# Patient Record
Sex: Female | Born: 1995 | Race: White | Hispanic: No | Marital: Single | Smoking: Current every day smoker
Health system: Southern US, Community
[De-identification: ages and names within clinical notes are randomized; demographics above are authoritative.]

## PROBLEM LIST (undated history)

## (undated) DIAGNOSIS — A549 Gonococcal infection, unspecified: Secondary | ICD-10-CM

## (undated) DIAGNOSIS — F1910 Other psychoactive substance abuse, uncomplicated: Secondary | ICD-10-CM

## (undated) DIAGNOSIS — F32A Depression, unspecified: Secondary | ICD-10-CM

## (undated) DIAGNOSIS — M419 Scoliosis, unspecified: Secondary | ICD-10-CM

## (undated) DIAGNOSIS — G930 Cerebral cysts: Secondary | ICD-10-CM

## (undated) DIAGNOSIS — N390 Urinary tract infection, site not specified: Secondary | ICD-10-CM

## (undated) DIAGNOSIS — F319 Bipolar disorder, unspecified: Secondary | ICD-10-CM

## (undated) DIAGNOSIS — F329 Major depressive disorder, single episode, unspecified: Secondary | ICD-10-CM

## (undated) DIAGNOSIS — Z22322 Carrier or suspected carrier of Methicillin resistant Staphylococcus aureus: Secondary | ICD-10-CM

## (undated) HISTORY — DX: Urinary tract infection, site not specified: N39.0

## (undated) HISTORY — PX: WISDOM TOOTH EXTRACTION: SHX21

---
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
## 2014-01-10 ENCOUNTER — Emergency Department (HOSPITAL_COMMUNITY)
Admission: EM | Admit: 2014-01-10 | Discharge: 2014-01-10 | Disposition: A | Discharge status: Home / Self Care | Payer: Medicaid Other | Attending: Emergency Medicine | Admitting: Emergency Medicine

## 2014-01-10 ENCOUNTER — Encounter (HOSPITAL_COMMUNITY): Payer: Self-pay | Admitting: Emergency Medicine

## 2014-01-10 DIAGNOSIS — Z8614 Personal history of Methicillin resistant Staphylococcus aureus infection: Secondary | ICD-10-CM | POA: Diagnosis not present

## 2014-01-10 DIAGNOSIS — N390 Urinary tract infection, site not specified: Secondary | ICD-10-CM

## 2014-01-10 DIAGNOSIS — Z3202 Encounter for pregnancy test, result negative: Secondary | ICD-10-CM | POA: Diagnosis not present

## 2014-01-10 DIAGNOSIS — Z79899 Other long term (current) drug therapy: Secondary | ICD-10-CM | POA: Insufficient documentation

## 2014-01-10 DIAGNOSIS — N739 Female pelvic inflammatory disease, unspecified: Secondary | ICD-10-CM

## 2014-01-10 DIAGNOSIS — Z202 Contact with and (suspected) exposure to infections with a predominantly sexual mode of transmission: Secondary | ICD-10-CM | POA: Diagnosis present

## 2014-01-10 DIAGNOSIS — Z8739 Personal history of other diseases of the musculoskeletal system and connective tissue: Secondary | ICD-10-CM | POA: Insufficient documentation

## 2014-01-10 LAB — URINE MICROSCOPIC-ADD ON

## 2014-01-10 LAB — URINALYSIS, ROUTINE W REFLEX MICROSCOPIC
GLUCOSE, UA: NEGATIVE mg/dL
Nitrite: NEGATIVE
PH: 6 (ref 5.0–8.0)
Protein, ur: 30 mg/dL — AB
Specific Gravity, Urine: 1.032 — ABNORMAL HIGH (ref 1.005–1.030)
Urobilinogen, UA: 1 mg/dL (ref 0.0–1.0)

## 2014-01-10 LAB — WET PREP, GENITAL
CLUE CELLS WET PREP: NONE SEEN
TRICH WET PREP: NONE SEEN
YEAST WET PREP: NONE SEEN

## 2014-01-10 LAB — POC URINE PREG, ED: Preg Test, Ur: NEGATIVE

## 2014-01-10 MED ORDER — CEFTRIAXONE SODIUM 250 MG IJ SOLR
250.0000 mg | Freq: Once | INTRAMUSCULAR | Status: AC
  Administered 2014-01-10: 250 mg via INTRAMUSCULAR
  Filled 2014-01-10: qty 250

## 2014-01-10 MED ORDER — CEPHALEXIN 500 MG PO CAPS
500.0000 mg | ORAL_CAPSULE | Freq: Once | ORAL | Status: AC
  Administered 2014-01-10: 500 mg via ORAL
  Filled 2014-01-10: qty 1

## 2014-01-10 MED ORDER — LIDOCAINE HCL 1 % IJ SOLN
INTRAMUSCULAR | Status: AC
  Administered 2014-01-10: 1.6 mL
  Filled 2014-01-10: qty 20

## 2014-01-10 MED ORDER — DOXYCYCLINE HYCLATE 100 MG PO TABS: 100.0000 mg | ORAL_TABLET | Freq: Once | ORAL | Status: DC

## 2014-01-10 MED ORDER — CEPHALEXIN 500 MG PO CAPS: 500.0000 mg | ORAL_CAPSULE | Freq: Four times a day (QID) | ORAL | Status: DC

## 2014-01-10 MED ORDER — DOXYCYCLINE HYCLATE 100 MG PO TABS
100.0000 mg | ORAL_TABLET | Freq: Once | ORAL | Status: AC
  Administered 2014-01-10: 100 mg via ORAL
  Filled 2014-01-10: qty 1

## 2014-01-10 MED ORDER — AZITHROMYCIN 250 MG PO TABS
1000.0000 mg | ORAL_TABLET | Freq: Once | ORAL | Status: AC
  Administered 2014-01-10: 1000 mg via ORAL
  Filled 2014-01-10: qty 4

## 2014-01-10 NOTE — ED Notes (Addendum)
Pt reports unprotected sex, has had dysuria, vaginal discharge, and pelvic pain 8/10 x2 weeks. Last menstrual cycle at the end of June.

## 2014-01-10 NOTE — ED Provider Notes (Signed)
CSN: [id number]     Arrival date & time 01/10/14  1046 History   First MD Initiated Contact with Patient 01/10/14 1106     Chief Complaint  Patient presents with  . SEXUALLY TRANSMITTED DISEASE     (Consider location/radiation/quality/duration/timing/severity/associated sxs/prior Treatment) Patient is a 18 y.o. female presenting with female genitourinary complaint. The history is provided by the patient.  Female GU Problem This is a new problem. The problem occurs constantly. The problem has been gradually worsening. Associated symptoms include abdominal pain. Pertinent negatives include no shortness of breath. Nothing aggravates the symptoms. Nothing relieves the symptoms.    Past Medical History  Diagnosis Date  . Scoliosis   . Scoliosis   . MRSA (methicillin resistant staph aureus) culture positive    Past Surgical History  Procedure Laterality Date  . Back surgery  2012    spinal fusion  . Back surgery  2012    MRSA infection- rods removed.  . Back surgery  2013    Rods put back in   History reviewed. No pertinent family history. History  Substance Use Topics  . Smoking status: Passive Smoke Exposure - Never Smoker  . Smokeless tobacco: Not on file  . Alcohol Use: No   OB History   Grav Para Term Preterm Abortions TAB SAB Ect Mult Living                 Review of Systems  Constitutional: Negative for fever.  Respiratory: Negative for cough and shortness of breath.   Gastrointestinal: Positive for abdominal pain. Negative for vomiting.  All other systems reviewed and are negative.     Allergies  Review of patient's allergies indicates no known allergies.  Home Medications   Prior to Admission medications   Medication Sig Start Date End Date Taking? Authorizing Provider  ARIPiprazole (ABILIFY) 5 MG tablet Take 2.5 mg by mouth daily.    Yes Historical Provider, MD  benztropine (COGENTIN) 1 MG tablet Take 1 mg by mouth at bedtime.   Yes Historical Provider,  MD  diazepam (VALIUM) 5 MG tablet Take 5 mg by mouth every 6 (six) hours as needed. For muscle spasms.   Yes Historical Provider, MD  FLUoxetine (PROZAC) 10 MG tablet Take 10 mg by mouth every morning.    Yes Historical Provider, MD   BP 131/67  Pulse 101  Temp(Src) 98.5 F (36.9 C) (Oral)  Resp 16  SpO2 97% Physical Exam  Nursing note and vitals reviewed. Constitutional: She is oriented to person, place, and time. She appears well-developed and well-nourished. No distress.  HENT:  Head: Normocephalic and atraumatic.  Mouth/Throat: Oropharynx is clear and moist. No oropharyngeal exudate.  Eyes: EOM are normal. Pupils are equal, round, and reactive to light.  Neck: Normal range of motion. Neck supple.  Cardiovascular: Normal rate and regular rhythm.  Exam reveals no friction rub.   No murmur heard. Pulmonary/Chest: Effort normal and breath sounds normal. No respiratory distress. She has no wheezes. She has no rales.  Abdominal: Soft. She exhibits no distension. There is tenderness. There is no rebound.  Genitourinary: Cervix exhibits motion tenderness, discharge (yellow) and friability. Right adnexum displays tenderness. Right adnexum displays no mass and no fullness. Left adnexum displays tenderness. Left adnexum displays no mass and no fullness.  Musculoskeletal: Normal range of motion. She exhibits no edema.  Neurological: She is alert and oriented to person, place, and time.  Skin: No rash noted. She is not diaphoretic.    ED Course  Procedures (including critical care time) Labs Review Labs Reviewed  GC/CHLAMYDIA PROBE AMP  WET PREP, GENITAL  URINALYSIS, ROUTINE W REFLEX MICROSCOPIC  POC URINE PREG, ED    Imaging Review No results found.   EKG Interpretation None      MDM   Final diagnoses:  Pelvic inflammatory disease  UTI (lower urinary tract infection)    35F here with pelvic pain, white vaginal discharge, dysuria for past 2 weeks. Concerned about STDs. No  fevers, vomiting. On exam, mild diffuse lower abdominal pain. Will perform pelvic. Pelvic shows cervical tenderness, diffuse lower abdominal tenderness, no adnexal masses. Concern for PID. Treated with doxy, rocephin, azithro. UA shows UTI, also given keflex. Partner in the room advised on receiving treatment. Discharged.  Dagmar HaitWilliam Deckard Stuber, MD 01/10/14 1537

## 2014-01-10 NOTE — Discharge Instructions (Signed)
Pelvic Inflammatory Disease °Pelvic inflammatory disease (PID) refers to an infection in some or all of the female organs. The infection can be in the uterus, ovaries, fallopian tubes, or the surrounding tissues in the pelvis. PID can cause abdominal or pelvic pain that comes on suddenly (acute pelvic pain). PID is a serious infection because it can lead to lasting (chronic) pelvic pain or the inability to have children (infertile).  °CAUSES  °The infection is often caused by the normal bacteria found in the vaginal tissues. PID may also be caused by an infection that is spread during sexual contact. PID can also occur following:  °· The birth of a baby.   °· A miscarriage.   °· An abortion.   °· Major pelvic surgery.   °· The use of an intrauterine device (IUD).   °· A sexual assault.   °RISK FACTORS °Certain factors can put a person at higher risk for PID, such as: °· Being younger than 25 years. °· Being sexually active at a young age. °· Using nonbarrier contraception. °· Having multiple sexual partners. °· Having sex with someone who has symptoms of a genital infection. °· Using oral contraception. °Other times, certain behaviors can increase the possibility of getting PID, such as: °· Having sex during your period. °· Using a vaginal douche. °· Having an intrauterine device (IUD) in place. °SYMPTOMS  °· Abdominal or pelvic pain.   °· Fever.   °· Chills.   °· Abnormal vaginal discharge. °· Abnormal uterine bleeding.   °· Unusual pain shortly after finishing your period. °DIAGNOSIS  °Your caregiver will choose some of the following methods to make a diagnosis, such as:  °· Performing a physical exam and history. A pelvic exam typically reveals a very tender uterus and surrounding pelvis.   °· Ordering laboratory tests including a pregnancy test, blood tests, and urine test.  °· Ordering cultures of the vagina and cervix to check for a sexually transmitted infection (STI). °· Performing an ultrasound.    °· Performing a laparoscopic procedure to look inside the pelvis.   °TREATMENT  °· Antibiotic medicines may be prescribed and taken by mouth.   °· Sexual partners may be treated when the infection is caused by a sexually transmitted disease (STD).   °· Hospitalization may be needed to give antibiotics intravenously. °· Surgery may be needed, but this is rare. °It may take weeks until you are completely well. If you are diagnosed with PID, you should also be checked for human immunodeficiency virus (HIV).   °HOME CARE INSTRUCTIONS  °· If given, take your antibiotics as directed. Finish the medicine even if you start to feel better.   °· Only take over-the-counter or prescription medicines for pain, discomfort, or fever as directed by your caregiver.   °· Do not have sexual intercourse until treatment is completed or as directed by your caregiver. If PID is confirmed, your recent sexual partner(s) will need treatment.   °· Keep your follow-up appointments. °SEEK MEDICAL CARE IF:  °· You have increased or abnormal vaginal discharge.   °· You need prescription medicine for your pain.   °· You vomit.   °· You cannot take your medicines.   °· Your partner has an STD.   °SEEK IMMEDIATE MEDICAL CARE IF:  °· You have a fever.   °· You have increased abdominal or pelvic pain.   °· You have chills.   °· You have pain when you urinate.   °· You are not better after 72 hours following treatment.   °MAKE SURE YOU:  °· Understand these instructions. °· Will watch your condition. °· Will get help right away if you are not doing well or get worse. °  Document Released: 06/18/2005 Document Revised: 10/13/2012 Document Reviewed: 06/14/2011 [facility] Patient Information 2015 [place], [place]. This information is not intended to replace advice given to you by your health care provider. Make sure you discuss any questions you have with your health care provider.  Urinary Tract Infection Urinary tract infections (UTIs) can develop  anywhere along your urinary tract. Your urinary tract is your body's drainage system for removing wastes and extra water. Your urinary tract includes two kidneys, two ureters, a bladder, and a urethra. Your kidneys are a pair of bean-shaped organs. Each kidney is about the size of your fist. They are located below your ribs, one on each side of your spine. CAUSES Infections are caused by microbes, which are microscopic organisms, including fungi, viruses, and bacteria. These organisms are so small that they can only be seen through a microscope. Bacteria are the microbes that most commonly cause UTIs. SYMPTOMS  Symptoms of UTIs may vary by age and gender of the patient and by the location of the infection. Symptoms in young women typically include a frequent and intense urge to urinate and a painful, burning feeling in the bladder or urethra during urination. Older women and men are more likely to be tired, shaky, and weak and have muscle aches and abdominal pain. A fever may mean the infection is in your kidneys. Other symptoms of a kidney infection include pain in your back or sides below the ribs, nausea, and vomiting. DIAGNOSIS To diagnose a UTI, your caregiver will ask you about your symptoms. Your caregiver also will ask to provide a urine sample. The urine sample will be tested for bacteria and white blood cells. White blood cells are made by your body to help fight infection. TREATMENT  Typically, UTIs can be treated with medication. Because most UTIs are caused by a bacterial infection, they usually can be treated with the use of antibiotics. The choice of antibiotic and length of treatment depend on your symptoms and the type of bacteria causing your infection. HOME CARE INSTRUCTIONS  If you were prescribed antibiotics, take them exactly as your caregiver instructs you. Finish the medication even if you feel better after you have only taken some of the medication.  Drink enough water and  fluids to keep your urine clear or pale yellow.  Avoid caffeine, tea, and carbonated beverages. They tend to irritate your bladder.  Empty your bladder often. Avoid holding urine for long periods of time.  Empty your bladder before and after sexual intercourse.  After a bowel movement, women should cleanse from front to back. Use each tissue only once. SEEK MEDICAL CARE IF:   You have back pain.  You develop a fever.  Your symptoms do not begin to resolve within 3 days. SEEK IMMEDIATE MEDICAL CARE IF:   You have severe back pain or lower abdominal pain.  You develop chills.  You have nausea or vomiting.  You have continued burning or discomfort with urination. MAKE SURE YOU:   Understand these instructions.  Will watch your condition.  Will get help right away if you are not doing well or get worse. Document Released: 03/28/2005 Document Revised: 12/18/2011 Document Reviewed: 07/27/2011 [facility] Patient Information 2015 [place], [place]. This information is not intended to replace advice given to you by your health care provider. Make sure you discuss any questions you have with your health care provider.   Emergency Department Resource Guide 1) Find a Doctor and Pay Out of Pocket Although you won't have to find  out who is covered by your insurance plan, it is a good idea to ask around and get recommendations. You will then need to call the office and see if the doctor you have chosen will accept you as a new patient and what types of options they offer for patients who are self-pay. Some doctors offer discounts or will set up payment plans for their patients who do not have insurance, but you will need to ask so you aren't surprised when you get to your appointment.  2) Contact Your Local Health Department Not all health departments have doctors that can see patients for sick visits, but many do, so it is worth a call to see if yours does. If you don't know where your local  health department is, you can check in your phone book. The CDC also has a tool to help you locate your state's health department, and many state websites also have listings of all of their local health departments.  3) Find a Walk-in Clinic If your illness is not likely to be very severe or complicated, you may want to try a walk in clinic. These are popping up all over the country in pharmacies, drugstores, and shopping centers. They're usually staffed by nurse practitioners or physician assistants that have been trained to treat common illnesses and complaints. They're usually fairly quick and inexpensive. However, if you have serious medical issues or chronic medical problems, these are probably not your best option.  No Primary Care Doctor: - Call Health Connect at  [phone] - they can help you locate a primary care doctor that  accepts your insurance, provides certain services, etc. - Physician Referral Service- [phone]  Chronic Pain Problems: Organization         Address  Phone   Notes  [facility]  [phone] Patients need to be referred by their primary care doctor.   Medication Assistance: Organization         Address  Phone   Notes  [facility] Medication [facility] [address] [place] [place], [place] [address] [phone] --Must be a resident of [facility] -- Must have NO insurance coverage whatsoever (no Medicaid/ Medicare, etc.) -- The pt. MUST have a primary care doctor that directs their care regularly and follows them in the community   [facility]ist  [phone]   [employer]  [phone]    Agencies that provide inexpensive medical care: Organization         Address  Phone   Notes  [facility]  [phone]   [facility]    [phone]   [facility] [address] [place], [place] [address] [phone]   [facility] [address] [place] [place] [phone]   Planned Parenthood    [phone]   [facility]    [phone]   Community Health and [facility]  [address], [place] Phone:  [phone], Fax:  [phone] Hours of Operation:  9 am - 6 pm, M-F.  Also accepts Medicaid/Medicare and self-pay.  [facility]  [address], [place] Phone: [phone], Fax: [phone]. Hours of Operation:  8:30 am - 5:30 pm, M-F.  Also accepts Medicaid and self-pay.  [facility] [place] [address], [employer] Phone: [phone]   [facility] [address], [place],  [place] [phone], Ext. [phone] Mondays & Thursdays: 7-9 AM.  First 15 patients are seen on a first come, first serve basis.    Medicaid-accepting [facility] Providers:  Organization         Address  Phone   Notes  [facility] [address], [place] [phone]) [phone] Also accepts self-pay patients.  [facility] [address], [place]  [phone]) [phone]   [facility] [address], [place] [phone]) [phone]   [facility] [address] [place] [address], [place] [phone]) [phone]   Renaye RakersVeita Bland [address], [place]   [phone] Only accepts [place] Access [place] patients after they have their name applied to their card.   Self-Pay (no insurance) in [facility]:  Organization         Address  Phone   Notes  Sickle Cell Patients, [facility] [address], [place] [phone]   [facility] [address], [place] [phone]) [phone]   [facility] [place]  [address], [place] [phone]   [facility]/Dr. Osei-Bonsu  [address] [place] [address], [place] or [phone], [place] [phone] Phone number for both [place] and  [place] locations is the same.  [facility] and [facility] [address], [place] [phone]   [facility] [place] [address] [place] [address], [place] or [address] [phone]) [phone]   [facility] [address], [place] [phone]) [phone], phone; [phone](336) [phone], fax Sees patients 1st and 3rd Saturday of every month.  Must not qualify for public or private insurance (i.e. Medicaid, Medicare, [place] Health Choice, Veterans' Benefits)  Household income should be no more than 200% of the poverty level The clinic cannot treat you if you are pregnant or think you are pregnant  Sexually transmitted diseases are not treated at the clinic.    Dental Care: Organization         Address  Phone  Notes  [facility] of [facility] [address], [place] [phone] Accepts children up to age 19 who are enrolled in [place] or [place] Health Choice; pregnant women with a Medicaid card; and children who have applied for Medicaid or [place] Health Choice, but were declined, whose parents can pay a reduced fee at time of service.  [facility] of [facility] [place]  [address], [place] [phone] Accepts children up to age 19 who are enrolled in [place] or [place] Health Choice; pregnant women with a Medicaid card; and children who have applied for Medicaid or [place] Health Choice, but were declined, whose parents can pay a reduced fee at time of service.  [facility]  [address], [place] [phone]) [phone] Patients are seen by appointment only. Walk-ins are not accepted. [facility] will see patients 19 years of age and older. Monday - Tuesday (8am-5pm) Most Wednesdays (8:30-5pm) $30 per visit, cash only  [facility]  [address], [facility] [phone] Patients are seen by appointment only. Walk-ins are not accepted.  [facility] will see patients 19 years of age and older. One Wednesday Evening (Monthly: Volunteer Based).  $30 per visit, cash only  [employer] [facility] [facility]  (  [phone] for adults; Children under age 74, call Graduate Pediatric Dentistry at [phone]. Children aged 77-14, please call [phone] to request a pediatric application.  Dental services are provided in all areas of dental care including fillings, crowns and bridges, complete and partial dentures, implants, gum treatment, root canals, and extractions. Preventive care is also provided. Treatment is provided to both adults and children. Patients are selected via a lottery and there is often a waiting list.   [facility] [address], [place]  [phone] [email or web address]   [facility] [address] [place], [place] [phone], Ext. [phone] Second and Fourth Thursday of each month, opens at 6:30 AM; Clinic ends at 9 AM.  Patients are seen on a first-come first-served basis, and a limited number are seen during each clinic.   [facility]  [address] Hillard Danker [place], [place] [phone]   Eligibility Requirements You must have lived in [place], [place], or [place] counties for at least the last three months.   You cannot be eligible for state or federal sponsored [employer], including [employer], [place], or [employer].   You generally cannot be eligible for healthcare insurance through your employer.    How to apply: Eligibility screenings are held every Tuesday and Wednesday afternoon from 1:00 pm until 4:00 pm. You do not need an appointment for the interview!  [facility] [address], [place], [place]   [place]  [place] Department  [place]  [phone]    Behavioral Health Resources in the  Community: Intensive Outpatient Programs Organization         Address  Phone  Notes  [address]. [address], [place], [place] [phone]   [facility] Outpatient [address] [place], [place]   ADS: [facility] Drug [facility] [address], [place], [place]   [place] [address],  [place], [place] or [phone]   Substance Abuse Resources Organization         Address  Phone  Notes  Alcohol and Drug Services  [phone]   [place]  [phone]   [facility] [place]   Chinita Pester  [phone]   Residential & Outpatient Substance Abuse Program  [phone]   Psychological Services Organization         Address  Phone  Notes  [facility] [place]  [place]  [phone]   [place] [address] [place] or [phone]    Mobile Crisis Teams Organization         Address  Phone  Notes  [facility], Mobile Crisis Care Unit  [phone]   [facility]  [address] [place], [place]   [place] Levels [address], [place] [phone]    Self-Help/Support Groups Organization         Address  Phone             Notes  [place]. of [place] - variety of support groups  [place] Call for more information  Narcotics Anonymous (NA), [facility] [address], [employer] [place]  2 meetings at this location   Brewing technologist  Notes  [facility] [address]    [place]  [phone]   [facility]  [address], [place] [address], [place], [place]   Council [place], [employer] [phone] Admissions: 8am-3pm M-F  Incentives Substance [place] [address],    [place], [place] [phone]   [facility] [address] [place], [place], [place]   [facility] [address],  [place], [place]   [facility] - Intensive Outpatient [address], [place], [place]   [facility] ([place].,  [place], [place] [phone] or [phone]   [facility] (RTS) [address] [place], [place] Accepts Medicaid  [employer] [address],  [place] [phone] Substance Abuse/Addiction Treatment   [facility] Organization         Address  Phone  Notes  [facility]  [phone]   Domenic Schwab, PhD [address] Arlis Porta [place], [place]   [phone] or [phone]   [place] Deale Opal [place], [place] [phone]   [facility] [address] [place], [place] [phone] Insurance/Medicaid/sponsorship through [facility] [address] [place]                                    [place], [place] [phone] [place] [address], [place] [phone]    Dr. Adele Schilder  [phone]   [facility] Dept. 1) [address], [place] 2) [place] 3)  [place] [address], [place] [phone]  [phone]   [place] [phone] or [phone] (After Hours)

## 2014-01-11 LAB — GC/CHLAMYDIA PROBE AMP
CT Probe RNA: NEGATIVE
GC PROBE AMP APTIMA: POSITIVE — AB

## 2014-01-14 ENCOUNTER — Telehealth (HOSPITAL_BASED_OUTPATIENT_CLINIC_OR_DEPARTMENT_OTHER): Payer: Self-pay | Admitting: Emergency Medicine

## 2014-01-16 ENCOUNTER — Encounter (HOSPITAL_COMMUNITY): Payer: Self-pay | Admitting: Emergency Medicine

## 2014-01-16 ENCOUNTER — Inpatient Hospital Stay (HOSPITAL_COMMUNITY)
Admission: EM | Admit: 2014-01-16 | Discharge: 2014-01-19 | DRG: 918 | Disposition: A | Discharge status: Other Acute Inpatient Hospital | Payer: Medicaid Other | Attending: Internal Medicine | Admitting: Internal Medicine

## 2014-01-16 DIAGNOSIS — I498 Other specified cardiac arrhythmias: Secondary | ICD-10-CM | POA: Diagnosis present

## 2014-01-16 DIAGNOSIS — A5400 Gonococcal infection of lower genitourinary tract, unspecified: Secondary | ICD-10-CM | POA: Diagnosis present

## 2014-01-16 DIAGNOSIS — R45851 Suicidal ideations: Secondary | ICD-10-CM | POA: Diagnosis not present

## 2014-01-16 DIAGNOSIS — D62 Acute posthemorrhagic anemia: Secondary | ICD-10-CM | POA: Diagnosis present

## 2014-01-16 DIAGNOSIS — F319 Bipolar disorder, unspecified: Secondary | ICD-10-CM | POA: Diagnosis present

## 2014-01-16 DIAGNOSIS — T43294A Poisoning by other antidepressants, undetermined, initial encounter: Secondary | ICD-10-CM | POA: Diagnosis present

## 2014-01-16 DIAGNOSIS — D500 Iron deficiency anemia secondary to blood loss (chronic): Secondary | ICD-10-CM | POA: Diagnosis present

## 2014-01-16 DIAGNOSIS — Z981 Arthrodesis status: Secondary | ICD-10-CM

## 2014-01-16 DIAGNOSIS — T43502A Poisoning by unspecified antipsychotics and neuroleptics, intentional self-harm, initial encounter: Secondary | ICD-10-CM | POA: Diagnosis present

## 2014-01-16 DIAGNOSIS — Z9119 Patient's noncompliance with other medical treatment and regimen: Secondary | ICD-10-CM | POA: Diagnosis not present

## 2014-01-16 DIAGNOSIS — T50992A Poisoning by other drugs, medicaments and biological substances, intentional self-harm, initial encounter: Secondary | ICD-10-CM | POA: Diagnosis present

## 2014-01-16 DIAGNOSIS — T438X2A Poisoning by other psychotropic drugs, intentional self-harm, initial encounter: Secondary | ICD-10-CM | POA: Diagnosis present

## 2014-01-16 DIAGNOSIS — T361X1A Poisoning by cephalosporins and other beta-lactam antibiotics, accidental (unintentional), initial encounter: Secondary | ICD-10-CM | POA: Diagnosis present

## 2014-01-16 DIAGNOSIS — M4120 Other idiopathic scoliosis, site unspecified: Secondary | ICD-10-CM | POA: Diagnosis present

## 2014-01-16 DIAGNOSIS — T364X1A Poisoning by tetracyclines, accidental (unintentional), initial encounter: Secondary | ICD-10-CM | POA: Diagnosis present

## 2014-01-16 DIAGNOSIS — R4182 Altered mental status, unspecified: Secondary | ICD-10-CM | POA: Diagnosis not present

## 2014-01-16 DIAGNOSIS — T43224A Poisoning by selective serotonin reuptake inhibitors, undetermined, initial encounter: Secondary | ICD-10-CM | POA: Diagnosis present

## 2014-01-16 DIAGNOSIS — A549 Gonococcal infection, unspecified: Secondary | ICD-10-CM | POA: Diagnosis present

## 2014-01-16 DIAGNOSIS — T50902A Poisoning by unspecified drugs, medicaments and biological substances, intentional self-harm, initial encounter: Secondary | ICD-10-CM

## 2014-01-16 DIAGNOSIS — T426X1A Poisoning by other antiepileptic and sedative-hypnotic drugs, accidental (unintentional), initial encounter: Secondary | ICD-10-CM | POA: Diagnosis present

## 2014-01-16 DIAGNOSIS — T50901A Poisoning by unspecified drugs, medicaments and biological substances, accidental (unintentional), initial encounter: Secondary | ICD-10-CM | POA: Insufficient documentation

## 2014-01-16 DIAGNOSIS — T50902D Poisoning by unspecified drugs, medicaments and biological substances, intentional self-harm, subsequent encounter: Secondary | ICD-10-CM

## 2014-01-16 DIAGNOSIS — R9431 Abnormal electrocardiogram [ECG] [EKG]: Secondary | ICD-10-CM | POA: Diagnosis present

## 2014-01-16 DIAGNOSIS — R001 Bradycardia, unspecified: Secondary | ICD-10-CM | POA: Diagnosis present

## 2014-01-16 DIAGNOSIS — F314 Bipolar disorder, current episode depressed, severe, without psychotic features: Secondary | ICD-10-CM

## 2014-01-16 DIAGNOSIS — I959 Hypotension, unspecified: Secondary | ICD-10-CM | POA: Diagnosis present

## 2014-01-16 DIAGNOSIS — Z91199 Patient's noncompliance with other medical treatment and regimen due to unspecified reason: Secondary | ICD-10-CM

## 2014-01-16 HISTORY — DX: Bipolar disorder, unspecified: F31.9

## 2014-01-16 HISTORY — DX: Major depressive disorder, single episode, unspecified: F32.9

## 2014-01-16 HISTORY — DX: Gonococcal infection, unspecified: A54.9

## 2014-01-16 HISTORY — DX: Depression, unspecified: F32.A

## 2014-01-16 LAB — COMPREHENSIVE METABOLIC PANEL
ALT: 8 U/L (ref 0–35)
AST: 11 U/L (ref 0–37)
Albumin: 3.6 g/dL (ref 3.5–5.2)
Alkaline Phosphatase: 51 U/L (ref 39–117)
Anion gap: 11 (ref 5–15)
BUN: 10 mg/dL (ref 6–23)
CALCIUM: 9.2 mg/dL (ref 8.4–10.5)
CO2: 23 mEq/L (ref 19–32)
Chloride: 107 mEq/L (ref 96–112)
Creatinine, Ser: 0.52 mg/dL (ref 0.50–1.10)
GFR calc Af Amer: >90 mL/min (ref >90)
GFR calc non Af Amer: >90 mL/min (ref >90)
Glucose, Bld: 92 mg/dL (ref 70–99)
Potassium: 3.9 mEq/L (ref 3.7–5.3)
SODIUM: 141 meq/L (ref 137–147)
TOTAL PROTEIN: 7.1 g/dL (ref 6.0–8.3)
Total Bilirubin: 0.3 mg/dL (ref 0.3–1.2)

## 2014-01-16 LAB — ACETAMINOPHEN LEVEL: Acetaminophen (Tylenol), Serum: <15 ug/mL (ref 10–30)

## 2014-01-16 LAB — CBC WITH DIFFERENTIAL/PLATELET
BASOS ABS: 0 10*3/uL (ref 0.0–0.1)
Basophils Relative: 0 % (ref 0–1)
EOS ABS: 0.1 10*3/uL (ref 0.0–0.7)
Eosinophils Relative: 1 % (ref 0–5)
HCT: 36.5 % (ref 36.0–46.0)
Hemoglobin: 11.7 g/dL — ABNORMAL LOW (ref 12.0–15.0)
LYMPHS PCT: 18 % (ref 12–46)
Lymphs Abs: 1.2 10*3/uL (ref 0.7–4.0)
MCH: 29 pg (ref 26.0–34.0)
MCHC: 32.1 g/dL (ref 30.0–36.0)
MCV: 90.6 fL (ref 78.0–100.0)
Monocytes Absolute: 0.4 10*3/uL (ref 0.1–1.0)
Monocytes Relative: 6 % (ref 3–12)
NEUTROS PCT: 75 % (ref 43–77)
Neutro Abs: 5 10*3/uL (ref 1.7–7.7)
PLATELETS: 208 10*3/uL (ref 150–400)
RBC: 4.03 MIL/uL (ref 3.87–5.11)
RDW: 13.4 % (ref 11.5–15.5)
WBC: 6.7 10*3/uL (ref 4.0–10.5)

## 2014-01-16 LAB — URINALYSIS, ROUTINE W REFLEX MICROSCOPIC
Bilirubin Urine: NEGATIVE
GLUCOSE, UA: NEGATIVE mg/dL
Hgb urine dipstick: NEGATIVE
Ketones, ur: NEGATIVE mg/dL
Nitrite: NEGATIVE
PH: 6 (ref 5.0–8.0)
Protein, ur: 30 mg/dL — AB
Specific Gravity, Urine: 1.043 — ABNORMAL HIGH (ref 1.005–1.030)
Urobilinogen, UA: 0.2 mg/dL (ref 0.0–1.0)

## 2014-01-16 LAB — RAPID URINE DRUG SCREEN, HOSP PERFORMED
Amphetamines: NOT DETECTED
Barbiturates: NOT DETECTED
Benzodiazepines: NOT DETECTED
COCAINE: NOT DETECTED
OPIATES: NOT DETECTED
Tetrahydrocannabinol: NOT DETECTED

## 2014-01-16 LAB — SALICYLATE LEVEL: Salicylate Lvl: <2 mg/dL — ABNORMAL LOW (ref 2.8–20.0)

## 2014-01-16 LAB — MRSA PCR SCREENING: MRSA by PCR: NEGATIVE

## 2014-01-16 LAB — HIV ANTIBODY (ROUTINE TESTING W REFLEX): HIV 1&2 Ab, 4th Generation: NONREACTIVE

## 2014-01-16 LAB — URINE MICROSCOPIC-ADD ON

## 2014-01-16 LAB — MAGNESIUM: Magnesium: 1.8 mg/dL (ref 1.5–2.5)

## 2014-01-16 LAB — PREGNANCY, URINE: Preg Test, Ur: NEGATIVE

## 2014-01-16 MED ORDER — SODIUM CHLORIDE 0.9 % IJ SOLN
3.0000 mL | Freq: Two times a day (BID) | INTRAMUSCULAR | Status: DC
  Administered 2014-01-16 – 2014-01-19 (×2): 3 mL via INTRAVENOUS

## 2014-01-16 MED ORDER — SODIUM CHLORIDE 0.9 % IV SOLN
Freq: Once | INTRAVENOUS | Status: AC
  Administered 2014-01-17: via INTRAVENOUS

## 2014-01-16 MED ORDER — ONDANSETRON HCL 4 MG/2ML IJ SOLN: 4.0000 mg | Freq: Once | INTRAMUSCULAR | Status: DC

## 2014-01-16 MED ORDER — SODIUM CHLORIDE 0.9 % IV BOLUS (SEPSIS): 1000.0000 mL | Freq: Once | INTRAVENOUS | Status: DC

## 2014-01-16 MED ORDER — ONDANSETRON HCL 4 MG/2ML IJ SOLN: 4.0000 mg | Freq: Four times a day (QID) | INTRAMUSCULAR | Status: DC | PRN

## 2014-01-16 MED ORDER — ENOXAPARIN SODIUM 40 MG/0.4ML ~~LOC~~ SOLN
40.0000 mg | SUBCUTANEOUS | Status: DC
  Administered 2014-01-16 – 2014-01-18 (×3): 40 mg via SUBCUTANEOUS
  Filled 2014-01-16 (×4): qty 0.4

## 2014-01-16 MED ORDER — ALUM & MAG HYDROXIDE-SIMETH 200-200-20 MG/5ML PO SUSP
30.0000 mL | Freq: Four times a day (QID) | ORAL | Status: DC | PRN
Start: 2014-01-16 — End: 2014-01-19

## 2014-01-16 MED ORDER — SODIUM CHLORIDE 0.9 % IV SOLN: Freq: Once | INTRAVENOUS | Status: DC

## 2014-01-16 MED ORDER — ACETAMINOPHEN 650 MG RE SUPP: 650.0000 mg | Freq: Four times a day (QID) | RECTAL | Status: DC | PRN

## 2014-01-16 MED ORDER — ACETAMINOPHEN 325 MG PO TABS: 650.0000 mg | ORAL_TABLET | Freq: Four times a day (QID) | ORAL | Status: DC | PRN

## 2014-01-16 MED ORDER — ONDANSETRON HCL 4 MG PO TABS: 4.0000 mg | ORAL_TABLET | Freq: Four times a day (QID) | ORAL | Status: DC | PRN

## 2014-01-16 NOTE — H&P (Signed)
History and Physical:    Karen DrownMarissa J Kocher ZOX:[id number] DOB: 10/05/1995 DOA: 01/16/2014  Referring provider: Janifer Adieatyana Kiriche, PA-C. PCP: No primary provider on file.   Chief Complaint: Overdose  History of Present Illness:   Karen DrownMarissa J Kim is an 18 y.o. female with PMH of depression and recent diagnosis of bipolar disorder, [facility], treated with Prozac, Lexapro, and Abilify by Tamela OddiJo Hughes at [facility], but who has been noncompliant with her medications, brought to the hospital after overdosing on approximately 20 Prozac tablets, 20 Lexapro tablets, unspecified amount of doxycycline, Neurontin and Keflex in a to harm herself after she broke up with her boyfriend last night. When asked specifically if she was trying to harm herself, the patient denies to me and tells me she was "just trying to get high ". There is no known history of substance abuse. According to her mother, she has been refusing to take her psychiatric medications and refusing to followup with her treating physicians. She's been meeting men online via [employer] and has been sexually promiscuous, with recent diagnosis of gonorrhea (would not take the antibiotics prescribed to treat this because it was causing her GI issues and diarrhea). The patient's mother became alerted that she had overdosed when a friend called her saying that her daughter "took a bunch of pills ". When her mother confronted her and called EMS, the patient said "why are you doing this to me? Why can't you just let me die? "Patient is currently alert and responsive. She has some nausea but otherwise is without significant complaints.  ROS:   Constitutional: No fever, no chills;  Appetite normal; No weight loss, no weight gain, + fatigue.  HEENT: No blurry vision, no diplopia, no pharyngitis, no dysphagia CV: No chest pain, no palpitations, no PND, no orthopnea, no edema.  Resp: + mild SOB, no cough, no pleuritic  pain. GI: + nausea, + recent vomiting, + recent diarrhea, no melena, no hematochezia, no constipation, no abdominal pain.  GU: No dysuria, no hematuria, no frequency, no urgency. MSK: no myalgias, no arthralgias.  Neuro:  + headache, no focal neurological deficits, no history of seizures.  Psych: + depression, +anxiety.  Endo: No heat intolerance, no cold intolerance, no polyuria, no polydipsia  Skin: No rashes, no skin lesions.  Heme: No easy bruising.  Travel history: No recent travel.   Past Medical History:   Past Medical History  Diagnosis Date  . Scoliosis   . MRSA (methicillin resistant staph aureus) culture positive   . Bipolar affective   . Depression   . Gonorrhea     Past Surgical History:   Past Surgical History  Procedure Laterality Date  . Back surgery  2012    spinal fusion  . Back surgery  2012    MRSA infection- rods removed.  . Back surgery  2013    Rods put back in    Social History:   History   Social History  . Marital Status: Single    Spouse Name: N/A    Number of Children: N/A  . Years of Education: 12   Occupational History  . Unemployed    Social History Main Topics  . Smoking status: Passive Smoke Exposure - Never Smoker  . Smokeless tobacco: Not on file  . Alcohol Use: No  . Drug Use: No  . Sexual Activity: No   Other Topics Concern  . Not on file   Social History Narrative  Lives with mother. Single. Graduated high school. Currently unemployed.    Family history:   Family History  Problem Relation Age of Onset  . Suicidality Father     Committed suicide    Allergies   Review of patient's allergies indicates no known allergies.  Current Medications:   Prior to Admission medications   Medication Sig Start Date End Date Taking? Authorizing Provider  ARIPiprazole (ABILIFY) 5 MG tablet Take 2.5 mg by mouth daily.    Yes Historical Provider, MD  cephALEXin (KEFLEX) 500 MG capsule Take 1 capsule (500 mg total) by mouth 4  (four) times daily. 01/10/14  Yes Dagmar Hait, MD  doxycycline (VIBRA-TABS) 100 MG tablet Take 100 mg by mouth 2 (two) times daily. 01/10/14  Yes Dagmar Hait, MD  escitalopram (LEXAPRO) 5 MG tablet Take 5 mg by mouth daily.   Yes Historical Provider, MD  FLUoxetine (PROZAC) 10 MG tablet Take 10 mg by mouth every morning.     Historical Provider, MD    Physical Exam:   Filed Vitals:   01/16/14 1312  BP: 109/61  Pulse: 74  Temp: 97.7 F (36.5 C)  TempSrc: Oral  Resp: 22  SpO2: 100%     Physical Exam: Blood pressure 109/61, pulse 74, temperature 97.7 F (36.5 C), temperature source Oral, resp. rate 22, SpO2 100.00%. Gen: No acute distress.  Sedated. Head: Normocephalic, atraumatic. Eyes: PERRL with dilated pupils, EOMI, sclerae nonicteric. Mouth: Oropharynx clear with dry mucous membranes. Neck: Supple, no thyromegaly, no lymphadenopathy, no jugular venous distention. Chest: Lungs clear to auscultation bilaterally. CV: Heart sounds are regular, no murmurs, rubs, or gallops. Abdomen: Soft, nontender, nondistended with normal active bowel sounds. Extremities: Extremities are without clubbing, edema, or cyanosis. Skin: Warm and dry. Neuro: Slightly sedated but oriented times 3; cranial nerves II through XII grossly intact. Psych: Mood and affect depressed/flat.   Data Review:    Labs: Basic Metabolic Panel:  Recent Labs Lab 01/16/14 1330  NA 141  K 3.9  CL 107  CO2 23  GLUCOSE 92  BUN 10  CREATININE 0.52  CALCIUM 9.2  MG 1.8   Liver Function Tests:  Recent Labs Lab 01/16/14 1330  AST 11  ALT 8  ALKPHOS 51  BILITOT 0.3  PROT 7.1  ALBUMIN 3.6   CBC:  Recent Labs Lab 01/16/14 1330  WBC 6.7  NEUTROABS 5.0  HGB 11.7*  HCT 36.5  MCV 90.6  PLT 208    Radiographic Studies: No results found.  EKG: Independently reviewed. Sinus rhythm at 75 beats per minute with no ST or T wave abnormalities appreciated.   Assessment/Plan:    Principal Problem:   Drug overdose  Poison control contacted with recommendations to monitor on telemetry, monitor electrolytes including potassium and magnesium, monitor for QTC prolongation, check 4 hour Tylenol levels, and administer IV fluids. Can give benzodiazepines if needed for agitation. Repeat EKG in the morning.  Potassium and magnesium currently within normal limits.  Recruitment consultant.  Psychiatric consultation. Hold psychotropic medications for now.  Active Problems:   Bipolar affective / depression  Psychiatric consultation.    Gonorrhea  Was treated but did not complete her antibiotic course. Recheck GC/Chlamydia and add RPR and HIV testing given that she is having unprotected sex.  DVT prophylaxis  Lovenox ordered.  Code Status: Full. Family Communication: Mother at bedside. Disposition Plan: Home versus inpatient psych facility when stable.  Time spent: 1 hour.  RAMA,CHRISTINA [facility] Pager [phone] Cell: [phone]  If 7PM-7AM, please contact night-coverage [email or web address] Password [email or web address] 01/16/2014, 3:30 PM    **Disclaimer: This note was dictated with voice recognition software. Similar sounding words can inadvertently be transcribed and this note may contain transcription errors which may not have been corrected upon publication of note.**

## 2014-01-16 NOTE — ED Notes (Signed)
Thyra BreedJeana, RN phoned prior to her arrival to tell us that pt. Took unknown quantities of the following:  Abilify;  Prozac;  Lexapro;  Doxycycline;  Neurontin; & Keflex.  With this combination they expect: possible ventricular dysrhythmias; >Qtc interval; Abd. Discomfort; CNS depression; tachycardia; and if sufficient quantity, Abilify can cause bradycardia.  They recommend Cardiac monitor; Replace K+ and Mg++ as needed for >Qtc; check serum electrolytes; 4-hour Tylenol level; IV fluids; Benzodiazepines as needed; Watch 24 hours and repeat EKG at that time. Poison control phone #[phone].

## 2014-01-16 NOTE — ED Notes (Signed)
Pt is refusing to Ambulate to restroom. I explained we would have to perform and I/O to obtain sample. Pt went back to resting without commenting.

## 2014-01-16 NOTE — ED Notes (Signed)
Pt's mom at bedside reports that she received facebook message from pt's friend stating pt overdosed with intent to kill herself. Mom states pt has hx of suicide attempt in past when she had to be intubated. Mom reports pt has been extremely promiscuous, running away from home and mom is very worried that "one day I will come home and find her dead, she is that determined to die". Mom sts [facility] past, but she feels they are not able to help her there and she needs to have inpatient treatment somewhere else.

## 2014-01-16 NOTE — ED Provider Notes (Signed)
CSN: [id number]     Arrival date & time 01/16/14  1305 History   First MD Initiated Contact with Patient 01/16/14 1310     No chief complaint on file.    (Consider location/radiation/quality/duration/timing/severity/associated sxs/prior Treatment) HPI Karen Kim is a 18 y.o. female who presents emergency department after an overdose. Patient with history of depression and anxiety, states her boyfriend broke up with her yesterday, she was upset today, and took "a bunch of different pills." She reports taking all of her medications and her father's medications.. Per EMS patient took all the remaining pills from bottles with Abilify, Prozac, Lexapro, doxycycline, Neurontin, Keflex. Patient states she believes there were approximately 20 pills in each bottle. She took all of these medications at 12 PM. She at first told us that she took the medications to get high, however then admitted that she was upset. Patient states she currently feels sedated, states has had some nausea and episode of vomiting. Denies vomiting any pills. Denies disorientation, chest pain, shortness of breath, abdominal pain.   Past Medical History  Diagnosis Date  . Scoliosis   . Scoliosis   . MRSA (methicillin resistant staph aureus) culture positive    Past Surgical History  Procedure Laterality Date  . Back surgery  2012    spinal fusion  . Back surgery  2012    MRSA infection- rods removed.  . Back surgery  2013    Rods put back in   No family history on file. History  Substance Use Topics  . Smoking status: Passive Smoke Exposure - Never Smoker  . Smokeless tobacco: Not on file  . Alcohol Use: No   OB History   Grav Para Term Preterm Abortions TAB SAB Ect Mult Living                 Review of Systems  Constitutional: Positive for fatigue. Negative for fever and chills.  Respiratory: Negative for cough, chest tightness and shortness of breath.   Cardiovascular: Negative for chest pain,  palpitations and leg swelling.  Gastrointestinal: Negative for nausea, vomiting, abdominal pain and diarrhea.  Genitourinary: Negative for dysuria and flank pain.  Musculoskeletal: Negative for arthralgias, myalgias, neck pain and neck stiffness.  Skin: Negative for rash.  Neurological: Positive for weakness and light-headedness. Negative for dizziness and headaches.  Psychiatric/Behavioral: Positive for self-injury. The patient is nervous/anxious.   All other systems reviewed and are negative.     Allergies  Review of patient's allergies indicates no known allergies.  Home Medications   Prior to Admission medications   Medication Sig Start Date End Date Taking? Authorizing Provider  ARIPiprazole (ABILIFY) 5 MG tablet Take 2.5 mg by mouth daily.     Historical Provider, MD  benztropine (COGENTIN) 1 MG tablet Take 1 mg by mouth at bedtime.    Historical Provider, MD  cephALEXin (KEFLEX) 500 MG capsule Take 1 capsule (500 mg total) by mouth 4 (four) times daily. 01/10/14   Dagmar HaitWilliam Blair Walden, MD  diazepam (VALIUM) 5 MG tablet Take 5 mg by mouth every 6 (six) hours as needed. For muscle spasms.    Historical Provider, MD  doxycycline (VIBRA-TABS) 100 MG tablet Take 1 tablet (100 mg total) by mouth once. 01/10/14   Dagmar HaitWilliam Blair Walden, MD  FLUoxetine (PROZAC) 10 MG tablet Take 10 mg by mouth every morning.     Historical Provider, MD   BP 109/61  Pulse 74  Temp(Src) 97.7 F (36.5 C) (Oral)  Resp 22  SpO2 100% Physical Exam  Nursing note and vitals reviewed. Constitutional: She is oriented to person, place, and time. She appears well-developed and well-nourished. No distress.  HENT:  Head: Normocephalic.  Eyes: Conjunctivae are normal.  Pupils dilated  Neck: Neck supple.  Cardiovascular: Normal rate, regular rhythm and normal heart sounds.   Pulmonary/Chest: Effort normal and breath sounds normal. No respiratory distress. She has no wheezes. She has no rales.  Abdominal: Soft.  Bowel sounds are normal. She exhibits no distension. There is no tenderness. There is no rebound.  Musculoskeletal: She exhibits no edema.  Neurological: She is alert and oriented to person, place, and time.  Skin: Skin is warm and dry.  Psychiatric: Her speech is normal and behavior is normal. Cognition and memory are normal. She exhibits a depressed mood. She expresses suicidal ideation. She expresses suicidal plans.  Pt is tearful    ED Course  Procedures (including critical care time) Labs Review Labs Reviewed  CBC WITH DIFFERENTIAL - Abnormal; Notable for the following:    Hemoglobin 11.7 (*)    All other components within normal limits  SALICYLATE LEVEL - Abnormal; Notable for the following:    Salicylate Lvl <2.0 (*)    All other components within normal limits  COMPREHENSIVE METABOLIC PANEL  MAGNESIUM  ACETAMINOPHEN LEVEL  URINE RAPID DRUG SCREEN (HOSP PERFORMED)  URINALYSIS, ROUTINE W REFLEX MICROSCOPIC  POC URINE PREG, ED    Imaging Review No results found.   EKG Interpretation None      MDM   Final diagnoses:  Overdose, intentional self-harm, initial encounter    Discussed with Poison Control Plan: Monitor, ECG, watch for prolonged QTC, Electrolyte replacement as needed, Q4 hr tylenol level. IV fluids. Benzos as needed. Watch for 24 hrs. Repeat ECG.   2:11 PM Pt continues to be awake, slightly drowsy. Protecting airway. No Distress. No complaints other than nausea, zofran ordered. IV fluids running.   Labs all normal. ECG normal. Pt solmnolent but easily arousable to voice. Will admit.   [facility] with [facility], will [facility] pt.   Filed Vitals:   01/16/14 1312  BP: 109/61  Pulse: 74  Temp: 97.7 F (36.5 C)  TempSrc: Oral  Resp: 22  SpO2: 100%       Lottie Mussel, PA-C 01/16/14 1546

## 2014-01-16 NOTE — ED Notes (Signed)
Notified pt we needed urine sample. Pt stated she didn't have to go at this time. Pt said she would notify when she was able to void.

## 2014-01-16 NOTE — ED Notes (Signed)
Bed: RESB Expected date:  Expected time:  Means of arrival:  Comments: EMS-OD 

## 2014-01-17 DIAGNOSIS — R45851 Suicidal ideations: Secondary | ICD-10-CM

## 2014-01-17 DIAGNOSIS — F3160 Bipolar disorder, current episode mixed, unspecified: Secondary | ICD-10-CM

## 2014-01-17 DIAGNOSIS — I959 Hypotension, unspecified: Secondary | ICD-10-CM | POA: Diagnosis present

## 2014-01-17 DIAGNOSIS — Z5189 Encounter for other specified aftercare: Secondary | ICD-10-CM

## 2014-01-17 DIAGNOSIS — R001 Bradycardia, unspecified: Secondary | ICD-10-CM | POA: Diagnosis present

## 2014-01-17 DIAGNOSIS — R9431 Abnormal electrocardiogram [ECG] [EKG]: Secondary | ICD-10-CM | POA: Diagnosis present

## 2014-01-17 LAB — COMPREHENSIVE METABOLIC PANEL
ALT: 6 U/L (ref 0–35)
ANION GAP: 6 (ref 5–15)
AST: 9 U/L (ref 0–37)
Albumin: 2.7 g/dL — ABNORMAL LOW (ref 3.5–5.2)
Alkaline Phosphatase: 41 U/L (ref 39–117)
BUN: 11 mg/dL (ref 6–23)
CO2: 25 mEq/L (ref 19–32)
Calcium: 8.4 mg/dL (ref 8.4–10.5)
Chloride: 109 mEq/L (ref 96–112)
Creatinine, Ser: 0.52 mg/dL (ref 0.50–1.10)
GFR calc non Af Amer: >90 mL/min (ref >90)
Glucose, Bld: 95 mg/dL (ref 70–99)
Potassium: 4.1 mEq/L (ref 3.7–5.3)
SODIUM: 140 meq/L (ref 137–147)
TOTAL PROTEIN: 5.6 g/dL — AB (ref 6.0–8.3)
Total Bilirubin: 0.2 mg/dL — ABNORMAL LOW (ref 0.3–1.2)

## 2014-01-17 LAB — MAGNESIUM: MAGNESIUM: 1.6 mg/dL (ref 1.5–2.5)

## 2014-01-17 LAB — ACETAMINOPHEN LEVEL
Acetaminophen (Tylenol), Serum: <15 ug/mL (ref 10–30)
Acetaminophen (Tylenol), Serum: <15 ug/mL (ref 10–30)

## 2014-01-17 LAB — RPR

## 2014-01-17 LAB — TROPONIN I

## 2014-01-17 MED ORDER — SODIUM CHLORIDE 0.9 % IV SOLN
INTRAVENOUS | Status: DC
  Administered 2014-01-17: 12:00:00 via INTRAVENOUS

## 2014-01-17 NOTE — Progress Notes (Signed)
Pt alert x4 sitter and mother at bedside. Bp 's were in the 80's/50's, order given for one liter bolus, the Pt responded well to the fluids.Will continue to monitor. Estill DoomsSimon, Karima Carrell Mahario, RN 6:54 AM 6:55 AM

## 2014-01-17 NOTE — Progress Notes (Signed)
Pt's Safety sitter reported to mw that Pt is pulling on her IV line, asking to go home. When I walked in to Pt's room , pt was found pulling on her IV line visibly upset that she is not able to have her phone and telling me she wants to go home. Pt made to understand she is not medically cleared to go home and that she is involuntary committed to stay in the hospital, pt became more upset and asked to speak with a police officer, security department was notified, two security personal and a police officer came on the unit and spoke with Pt. Pt was relatively upset but not verbally or physically combative with staff.

## 2014-01-17 NOTE — Progress Notes (Signed)
CSW faxed IVC paperwork to Magistrate. Per Cain SieveAdamson, paperwork accepted and sheriff to be dispatched. IVC paperwork placed on chart and RN aware.  Mariann LasterAlexandra Jacqualynn Parco LCSWA,     ED CSW  phone: [phone] 3:10pm

## 2014-01-17 NOTE — Progress Notes (Signed)
Pt alert x4  bp is soft 74/33, no c/o pain. Can answer yes and no question.Made Karen McgregorMary Lynch MP aware, orders given will monitor. Estill DoomsSimon, Manju Kulkarni Mahario, RN 01/17/2014 4:22 AM

## 2014-01-17 NOTE — Progress Notes (Addendum)
Clinical Social Work Department CLINICAL SOCIAL WORK PSYCHIATRY SERVICE LINE ASSESSMENT 01/17/2014  Patient:  Karen Kim  Account:  [id number]  Admit Date:  01/16/2014  Clinical Social Worker:  Mariann Laster  Date/Time:  01/17/2014 03:55 PM Referred by:  Physician  Date referred:  01/17/2014 Reason for Referral  Psychosocial assessment   Presenting Symptoms/Problems (In the person's/family's own words):   "I was brought here beacuse I overdosed on painkillers"   Abuse/Neglect/Trauma History (check all that apply)  Denies history   Abuse/Neglect/Trauma Comments:   Psychiatric History (check all that apply)  Inpatient/hospitilization  Outpatient treatment   Psychiatric medications:  Current Mental Health Hospitalizations/Previous Mental Health History:   Pt has diagnosis of bipolar disorder. Pt states she has panic disorder.   Current provider:   Dr. Starling Manns   Place and Date:   [facility]   Current Medications:   Scheduled Meds:      . enoxaparin (LOVENOX) injection  40 mg Subcutaneous Q24H  . sodium chloride  3 mL Intravenous Q12H        Continuous Infusions:      . sodium chloride 50 mL/hr at 01/17/14 1218          PRN Meds:.acetaminophen, acetaminophen, alum & mag hydroxide-simeth, ondansetron (ZOFRAN) IV, ondansetron       Previous Impatient Admission/Date/Reason:   [facility]/2012/Overdose  [facility]/2012/self-injurious behavior   Emotional Health / Current Symptoms    Suicide/Self Harm  Suicide attempt in past (date/description)  Suicidal ideation (ex: "I can't take any more,I wish I could disappear")   Suicide attempt in the past:   2012--Overdose on a combination of her own pain medication and other medication she found in her parent's house. This led to her hospitalization at [facility].   Other harmful behavior:   Pt states that she has passive suicidal ideation currently because "something really bad has  happened recently, but I don't have an acutal plan to kill myself. I just think about dying sometimes." declines to discuss "really bad" event further at this time.   Psychotic/Dissociative Symptoms  Other - See comment   Other Psychotic/Dissociative Symptoms:   Pt states    Attention/Behavioral Symptoms  Within Normal Limits   Other Attention / Behavioral Symptoms:    Cognitive Impairment  Within Normal Limits   Other Cognitive Impairment:    Mood and Adjustment  Aggressive/frustrated  Guarded    Stress, Anxiety, Trauma, Any Recent Loss/Stressor  Other - See comment   Anxiety (frequency):   Pt reports she has panic attacks.   Phobia (specify):   Compulsive behavior (specify):   Obsessive behavior (specify):   Other:   Pt reports she has suffered a loss recently, but pt declines to discuss further at this time.   Substance Abuse/Use  Current substance use   SBIRT completed (please refer for detailed history):  Y  Self-reported substance use:   Alcohol--last use: one month  ago freq: only on holidays and special occasions "but when I drink, I binge drink, like, 10 or more beers" Marijuana--last use:  2 days ago freq: "I just started smoking, maybe a week ago, this was the third time I've tried it."  shared a bowl with friends (not on drug screen)  Opiates--last use: overdose (not on drug screen)   Urinary Drug Screen Completed:  Y Alcohol level:   none detected    Environmental/Housing/Living Arrangement  With Biological Parent(s)   Who is in the home:   Pt  lives with her mother, step father,a  younger brother (87) and an older brother (58)   Emergency contact:  mother--Angie Krus--[phone]   Financial  Medicaid   Patient's Strengths and Goals (patient's own words):   Pt states that she is good at drawing. Pt states she would like to move out on her own and get a car.   Clinical Social Worker's Interpretive Summary:   CSW received a referral  from Psych MD to complete psychosocial assessment.    CSW met with pt at bedside. Pt states that she came to the hospital because she overdosed on painkillers to get high. The drug screen reads that she overdosed on antidepressants and antibiotics. Pt states she was not trying to kill herself--only trying to get high. Pt currently endorses passive SI, stating she that "something really bad has happened recently, but I don't have an acutal plan to kill myself. I just think about dying sometimes."    Pt states that she attempted suicide once before in 2012, when she overdosed on painkillers. She was hospitalized at [facility] after this attempt. She was also hospitalized at [facility] in 2011 after she cut her arm deeply with a shard of glass.    Pt is 1 and lives in [place] with her parents and two of her brothers, and she graduated from high school this June 2015. Pt's current psychiatrist is Jobie Quaker, MD at [facility]. Pt has a  diagnosis of bipolar disorder. Pt reports that she has stopped taking all of her psychiatric medication because she does not think it helps her. Pt could not provide a timeline on when she stopped taking the medication, nor a list of her medication.    CSW to confer with psychiatrist and assist with disposition.   Disposition:  Recommend Psych CSW continuing to support while in hospital
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
## 2014-01-17 NOTE — Progress Notes (Addendum)
Progress Note   ALYAH BOEHNING ZOX:[id number] DOB: 1995/11/25 DOA: 01/16/2014 PCP: No primary provider on file.   Brief Narrative:   Karen Kim is an 18 y.o. female with PMH of depression and recent diagnosis of bipolar disorder, multiple [facility] admissions for suicidal ideation, treated with Prozac, Lexapro, and Abilify by Tamela Oddi at [facility], but who has been noncompliant with her medications, brought to the hospital 01/16/14 after overdosing on approximately 20 Prozac tablets, 20 Lexapro tablets, unspecified amount of doxycycline, Neurontin and Keflex in a to harm herself after she broke up with her boyfriend. Psychiatric consultation is pending.  Assessment/Plan:   Principal Problem:  Drug overdose  Poison control contacted with recommendations to monitor on telemetry, monitor electrolytes including potassium and magnesium, monitor for QTC prolongation, check 4 hour Tylenol levels (not elevated), and administer IV fluids. Can give benzodiazepines if needed for agitation. Repeat EKG from this morning shows T-wave inversions in the anterior leads and a QTC of 405 ms.  UDS Potassium and magnesium remain within normal limits.  Continue Recruitment consultant.  Psychiatric consultation pending. Hold psychotropic medications for now.  Active Problems:  Hypotension / bradycardia / T-wave inversions  Responded to IV fluid bolus. Monitor on telemetry an additional 24 hours.  Check 1 set of troponins given persistent T-wave inversions in the anterior leads.  Bipolar affective / depression  Psychiatric consultation.  Gonorrhea  Was treated but did not complete her antibiotic course. Repeat GC/Chlamydia pending. HIV and RPR nonreactive. UPT negative.  DVT prophylaxis  Lovenox ordered.  Code Status: Full.  Family Communication: Mother at bedside.  Disposition Plan: Home versus inpatient psych facility when stable.    IV Access:    Peripheral IV   Procedures and  diagnostic studies:    None.   Medical Consultants:    Psychiatry.   Other Consultants:    None.   Anti-Infectives:    None.  Subjective:   Karen Kim without complaints this morning. No shortness of breath, nausea, vomiting, diarrhea or other complaints. Nursing staff report that she was bradycardic with soft blood pressures overnight.  Objective:    Filed Vitals:   01/17/14 0400 01/17/14 0500 01/17/14 0600 01/17/14 0645  BP: 92/49 87/42 88/34  107/57  Pulse: 61 67 56   Temp:      TempSrc:      Resp: 13 13 18 15   Height:      Weight:      SpO2: 100% 99% 97%     Intake/Output Summary (Last 24 hours) at 01/17/14 0716 Last data filed at 01/17/14 0641  Gross per 24 hour  Intake    625 ml  Output    400 ml  Net    225 ml    Exam: Gen:  NAD Psych: Flat affect Cardiovascular:  Mildly bradycardic, No M/R/G Respiratory:  Lungs CTAB Gastrointestinal:  Abdomen soft, NT/ND, + BS Extremities:  No C/E/C   Data Reviewed:    Labs: Basic Metabolic Panel:  Recent Labs Lab 01/16/14 1330 01/17/14 0526  NA 141 140  K 3.9 4.1  CL 107 109  CO2 23 25  GLUCOSE 92 95  BUN 10 11  CREATININE 0.52 0.52  CALCIUM 9.2 8.4  MG 1.8 1.6   GFR Estimated Creatinine Clearance: 117.2 ml/min (by C-G formula based on Cr of 0.52). Liver Function Tests:  Recent Labs Lab 01/16/14 1330 01/17/14 0526  AST 11 9  ALT 8 6  ALKPHOS 51 41  BILITOT  0.3 0.2*  PROT 7.1 5.6*  ALBUMIN 3.6 2.7*   CBC:  Recent Labs Lab 01/16/14 1330  WBC 6.7  NEUTROABS 5.0  HGB 11.7*  HCT 36.5  MCV 90.6  PLT 208   Microbiology Recent Results (from the past 240 hour(s))  GC/CHLAMYDIA PROBE AMP     Status: Abnormal   Collection Time    01/10/14 12:55 PM      Result Value Ref Range Status   CT Probe RNA NEGATIVE  NEGATIVE Final   GC Probe RNA POSITIVE (*) NEGATIVE Final   Comment: (NOTE)     A Positive CT or NG Nucleic Acid Amplification Test (NAAT) result     should be  considered presumptive evidence of infection.  The result     should be evaluated along with physical examination and other     diagnostic findings.                                                                                               **Normal Reference Range: Negative**          Assay performed using the Gen-Probe APTIMA COMBO2 (R) Assay.     Acceptable specimen types for this assay include APTIMA Swabs (Unisex,     endocervical, urethral, or vaginal), first void urine, and ThinPrep     liquid based cytology samples.     Performed at [facility]  WET PREP, GENITAL     Status: Abnormal   Collection Time    01/10/14  1:41 PM      Result Value Ref Range Status   Yeast Wet Prep HPF POC NONE SEEN  NONE SEEN Final   Trich, Wet Prep NONE SEEN  NONE SEEN Final   Clue Cells Wet Prep HPF POC NONE SEEN  NONE SEEN Final   WBC, Wet Prep HPF POC FEW (*) NONE SEEN Final  MRSA PCR SCREENING     Status: None   Collection Time    01/16/14  4:49 PM      Result Value Ref Range Status   MRSA by PCR NEGATIVE  NEGATIVE Final   Comment:            The GeneXpert MRSA Assay (FDA     approved for NASAL specimens     only), is one component of a     comprehensive MRSA colonization     surveillance program. It is not     intended to diagnose MRSA     infection nor to guide or     monitor treatment for     MRSA infections.     Medications:   . enoxaparin (LOVENOX) injection  40 mg Subcutaneous Q24H  . sodium chloride  3 mL Intravenous Q12H   Continuous Infusions:   Time spent: 25 minutes.   LOS: 1 day   Karen Kim  [facility] Pager [phone]. If unable to reach me by pager, please call my cell phone at [phone].  *Please refer to [email or web address], password [email or web address] to get updated schedule on who will round on this patient, as hospitalists switch teams weekly. If 7PM-7AM, please  contact night-coverage at [email or web address], password [email or web address] for any overnight needs.  01/17/2014, 7:16  AM    **Disclaimer: This note was dictated with voice recognition software. Similar sounding words can inadvertently be transcribed and this note may contain transcription errors which may not have been corrected upon publication of note.**

## 2014-01-17 NOTE — Progress Notes (Signed)
Patient requesting to walk around unit informed her that it would not be a good idea given her low BP it's best if she just stay in her room. Patient visibly upset states she wants to just leave, she's tired of being here. Informed her that she can't not leave because she has not been medically cleared. She states "what will happen if I try" I informed patient that the psychiatrist felt that she was a danger to herself given her recent SI attempt and has involuntarily committed you so that you can get the treatment you need, so if you continue to attempt to leave I will have to notify security and [employer] and possibly restrain you. Patient states she does not care she doesn't want to be here and she is going to pull out her IV and just leave. IV was then wrapped in Kerlix gauze. Patient states she will be okay if she can just have her cell phone but  her mom will not bring it to her/ Patient states she wants to be on facebook and instagram, can I please call her mother and ask her to bring phone. Spoke to patients mother and she refuses to bring phone states she needs to focus on getting better no contacting her ex boyfriend. Will  continue to monitor patient . Due to patient being a flight risk it is not advised to walk around unit per Dr.Rama

## 2014-01-17 NOTE — ED Provider Notes (Signed)
Medical screening examination/treatment/procedure(s) were performed by non-physician practitioner and as supervising physician I was immediately available for consultation/collaboration.    Zebedee Segundo L Nemiah Bubar, MD 01/17/14 0706 

## 2014-01-18 MED ORDER — DIPHENHYDRAMINE HCL 25 MG PO CAPS
25.0000 mg | ORAL_CAPSULE | Freq: Once | ORAL | Status: AC
  Administered 2014-01-18: 25 mg via ORAL
  Filled 2014-01-18: qty 1

## 2014-01-18 NOTE — Progress Notes (Signed)
Clinical Social Work  CSW met with patient at bedside with psych MD. CSW introduced myself and explained role. Patient reports she wanted to be re-evaluated because she wants to DC home. CSW explained patient is under IVC and psych MD still recommending inpatient psych placement. Patient reports she was not trying to kill herself but was trying to get high. Patient has had one previous suicide attempt about 2 years ago and was placed at [facility]. Patient reports that mother might think that she was suicidal due to previous attempts.  Patient guarded and upset about inpatient placement. CSW spoke with attending MD who reports that patient should be medically stable tomorrow. CSW will seek placement once patient is stable.  [place],  [phone]

## 2014-01-18 NOTE — Progress Notes (Signed)
Patient ID: Karen Kim, female   DOB: 12/12/1995, 18 y.o.   MRN: [id number]  [facility] PROGRESS NOTE  Karen Kim DOB: 02-13-1996 DOA: 01/16/2014 PCP: No primary provider on file.  Brief Narrative:   18 y.o. female with PMH of depression and recent diagnosis of bipolar disorder, multiple [facility] admissions for suicidal ideation, treated with Prozac, Lexapro, and Abilify by Tamela Oddi at [facility], but who has been noncompliant with her medications, brought to the hospital 01/16/14 after overdosing on approximately 20 Prozac tablets, 20 Lexapro tablets, unspecified amount of doxycycline, Neurontin and Keflex in an intent to harm herself after she broke up with her boyfriend.   Assessment/Plan:   Principal Problem:  Drug overdose  Poison control contacted with recommendations to monitor on telemetry, monitor electrolytes including potassium and magnesium, monitor for QTC prolongation, check 4 hour Tylenol levels (not elevated), and administer IV fluids. Can give benzodiazepines if needed for agitation.  Continue to monitor on tele for 24 hours and d/c in AM if no further events  UDS repeated and negative  Potassium and magnesium remain within normal limits. Will repeat electrolyte panel in AM Continue safety sitter.  Needs inpatient psych placement when bed available, possibly in next 24- 48 hours  Active Problems:  Hypotension / bradycardia / T-wave inversions   Responded to IV fluid bolus. Continue as needed   Repeat 12 lead EKG in AM  One set of troponin negative and pt denies chest pain or shortness of breath this AM  Bipolar affective / depression  Psychiatric consultation appreciated  Placement to [facility] pending  Gonorrhea  Was treated but did not complete her antibiotic course. Repeat GC/Chlamydia pending.  HIV and RPR nonreactive. UPT negative. DVT prophylaxis  Lovenox ordered while inpatient   Code Status: Full.  Family Communication: Mother  at bedside.  Disposition Plan: Inpatient psych, OK to transfer to telemetry bed   IV Access:   Peripheral IV Procedures and diagnostic studies:   None. Medical Consultants:   Psychiatry. Other Consultants:   None. Anti-Infectives:   None  HPI/Subjective: No events overnight.   Objective: Filed Vitals:   01/18/14 0403 01/18/14 0526 01/18/14 0528 01/18/14 0805  BP: 94/44  104/38   Pulse: 60  57   Temp:  96.9 F (36.1 C)  97.5 F (36.4 C)  TempSrc:  Oral    Resp: 19  18   Height:      Weight:  85.6 kg (188 lb 11.4 oz)    SpO2: 96%  100%     Intake/Output Summary (Last 24 hours) at 01/18/14 1148 Last data filed at 01/18/14 0600  Gross per 24 hour  Intake    900 ml  Output   1175 ml  Net   -275 ml    Exam:   General:  Pt is alert, follows commands appropriately, not in acute distress  Cardiovascular: Regular rhythm, bradycardic, S1/S2, no murmurs, no rubs, no gallops  Respiratory: Clear to auscultation bilaterally, no wheezing, no crackles, no rhonchi  Abdomen: Soft, non tender, non distended, bowel sounds present, no guarding  Extremities: No edema, pulses DP and PT palpable bilaterally  Neuro: Grossly nonfocal  Data Reviewed: Basic Metabolic Panel:  Recent Labs Lab 01/16/14 1330 01/17/14 0526  NA 141 140  K 3.9 4.1  CL 107 109  CO2 23 25  GLUCOSE 92 95  BUN 10 11  CREATININE 0.52 0.52  CALCIUM 9.2 8.4  MG 1.8 1.6   Liver Function Tests:  Recent Labs Lab 01/16/14 1330 01/17/14 0526  AST 11 9  ALT 8 6  ALKPHOS 51 41  BILITOT 0.3 0.2*  PROT 7.1 5.6*  ALBUMIN 3.6 2.7*   CBC:  Recent Labs Lab 01/16/14 1330  WBC 6.7  NEUTROABS 5.0  HGB 11.7*  HCT 36.5  MCV 90.6  PLT 208   Cardiac Enzymes:  Recent Labs Lab 01/17/14 1043  TROPONINI <0.30     Recent Results (from the past 240 hour(s))  GC/CHLAMYDIA PROBE AMP     Status: Abnormal   Collection Time    01/10/14 12:55 PM      Result Value Ref Range Status   CT Probe RNA  NEGATIVE  NEGATIVE Final   GC Probe RNA POSITIVE (*) NEGATIVE Final   Comment: (NOTE)     A Positive CT or NG Nucleic Acid Amplification Test (NAAT) result     should be considered presumptive evidence of infection.  The result     should be evaluated along with physical examination and other     diagnostic findings.                                                                                               **Normal Reference Range: Negative**          Assay performed using the Gen-Probe APTIMA COMBO2 (R) Assay.     Acceptable specimen types for this assay include APTIMA Swabs (Unisex,     endocervical, urethral, or vaginal), first void urine, and ThinPrep     liquid based cytology samples.     Performed at [facility]  WET PREP, GENITAL     Status: Abnormal   Collection Time    01/10/14  1:41 PM      Result Value Ref Range Status   Yeast Wet Prep HPF POC NONE SEEN  NONE SEEN Final   Trich, Wet Prep NONE SEEN  NONE SEEN Final   Clue Cells Wet Prep HPF POC NONE SEEN  NONE SEEN Final   WBC, Wet Prep HPF POC FEW (*) NONE SEEN Final  MRSA PCR SCREENING     Status: None   Collection Time    01/16/14  4:49 PM      Result Value Ref Range Status   MRSA by PCR NEGATIVE  NEGATIVE Final   Comment:            The GeneXpert MRSA Assay (FDA     approved for NASAL specimens     only), is one component of a     comprehensive MRSA colonization     surveillance program. It is not     intended to diagnose MRSA     infection nor to guide or     monitor treatment for     MRSA infections.     Scheduled Meds: . enoxaparin (LOVENOX) injection  40 mg Subcutaneous Q24H  . sodium chloride  3 mL Intravenous Q12H   Continuous Infusions: . sodium chloride 50 mL/hr at 01/17/14 1218     Debbora Presto, MD  [facility] Pager [phone]  If 7PM-7AM, please contact night-coverage  [email or web address] Password [email or web address] 01/18/2014, 11:48 AM   LOS: 2 days

## 2014-01-18 NOTE — Progress Notes (Signed)
Full report given to RN on 4E. Pt aware of transfer and asked to be re-evaluated by psyc. Pt states she thinks she "does not need to go to behavioral health". RN on 4E aware of pt's request to be re-evaluated by psyc.

## 2014-01-18 NOTE — Progress Notes (Signed)
Assumed care of the patient. No changes in initial AM care. Cont with plan of care 

## 2014-01-19 ENCOUNTER — Encounter (HOSPITAL_COMMUNITY): Payer: Self-pay | Admitting: *Deleted

## 2014-01-19 ENCOUNTER — Other Ambulatory Visit: Payer: Self-pay

## 2014-01-19 ENCOUNTER — Inpatient Hospital Stay (HOSPITAL_COMMUNITY)
Admission: AD | Admit: 2014-01-19 | Discharge: 2014-01-22 | DRG: 885 | Disposition: A | Discharge status: Home / Self Care | Payer: Medicaid Other | Source: Intra-hospital | Attending: Psychiatry | Admitting: Psychiatry

## 2014-01-19 DIAGNOSIS — M4120 Other idiopathic scoliosis, site unspecified: Secondary | ICD-10-CM | POA: Diagnosis present

## 2014-01-19 DIAGNOSIS — G4700 Insomnia, unspecified: Secondary | ICD-10-CM | POA: Diagnosis present

## 2014-01-19 DIAGNOSIS — F17200 Nicotine dependence, unspecified, uncomplicated: Secondary | ICD-10-CM | POA: Diagnosis present

## 2014-01-19 DIAGNOSIS — R45851 Suicidal ideations: Secondary | ICD-10-CM | POA: Diagnosis not present

## 2014-01-19 DIAGNOSIS — F319 Bipolar disorder, unspecified: Secondary | ICD-10-CM | POA: Diagnosis present

## 2014-01-19 DIAGNOSIS — F3113 Bipolar disorder, current episode manic without psychotic features, severe: Secondary | ICD-10-CM

## 2014-01-19 DIAGNOSIS — F411 Generalized anxiety disorder: Secondary | ICD-10-CM | POA: Diagnosis present

## 2014-01-19 DIAGNOSIS — T43224A Poisoning by selective serotonin reuptake inhibitors, undetermined, initial encounter: Secondary | ICD-10-CM | POA: Diagnosis not present

## 2014-01-19 DIAGNOSIS — IMO0002 Reserved for concepts with insufficient information to code with codable children: Secondary | ICD-10-CM | POA: Diagnosis not present

## 2014-01-19 DIAGNOSIS — D62 Acute posthemorrhagic anemia: Secondary | ICD-10-CM | POA: Diagnosis not present

## 2014-01-19 DIAGNOSIS — F332 Major depressive disorder, recurrent severe without psychotic features: Principal | ICD-10-CM | POA: Diagnosis present

## 2014-01-19 DIAGNOSIS — I959 Hypotension, unspecified: Secondary | ICD-10-CM | POA: Diagnosis not present

## 2014-01-19 LAB — COMPREHENSIVE METABOLIC PANEL
ALK PHOS: 43 U/L (ref 39–117)
ALT: 11 U/L (ref 0–35)
AST: 12 U/L (ref 0–37)
Albumin: 2.9 g/dL — ABNORMAL LOW (ref 3.5–5.2)
Anion gap: 8 (ref 5–15)
BILIRUBIN TOTAL: 0.4 mg/dL (ref 0.3–1.2)
BUN: 8 mg/dL (ref 6–23)
CHLORIDE: 107 meq/L (ref 96–112)
CO2: 25 meq/L (ref 19–32)
CREATININE: 0.55 mg/dL (ref 0.50–1.10)
Calcium: 8.7 mg/dL (ref 8.4–10.5)
GFR calc Af Amer: >90 mL/min (ref >90)
Glucose, Bld: 92 mg/dL (ref 70–99)
POTASSIUM: 3.8 meq/L (ref 3.7–5.3)
Sodium: 140 mEq/L (ref 137–147)
Total Protein: 5.7 g/dL — ABNORMAL LOW (ref 6.0–8.3)

## 2014-01-19 LAB — CBC
HEMATOCRIT: 32.2 % — AB (ref 36.0–46.0)
HEMOGLOBIN: 10.7 g/dL — AB (ref 12.0–15.0)
MCH: 30.1 pg (ref 26.0–34.0)
MCHC: 33.2 g/dL (ref 30.0–36.0)
MCV: 90.4 fL (ref 78.0–100.0)
Platelets: 184 10*3/uL (ref 150–400)
RBC: 3.56 MIL/uL — AB (ref 3.87–5.11)
RDW: 13.3 % (ref 11.5–15.5)
WBC: 6.5 10*3/uL (ref 4.0–10.5)

## 2014-01-19 LAB — MAGNESIUM: MAGNESIUM: 1.9 mg/dL (ref 1.5–2.5)

## 2014-01-19 LAB — GC/CHLAMYDIA PROBE AMP
CT Probe RNA: NEGATIVE
GC Probe RNA: NEGATIVE

## 2014-01-19 MED ORDER — ACETAMINOPHEN 325 MG PO TABS: 650.0000 mg | ORAL_TABLET | Freq: Four times a day (QID) | ORAL | Status: DC | PRN

## 2014-01-19 MED ORDER — TRAZODONE HCL 50 MG PO TABS
50.0000 mg | ORAL_TABLET | Freq: Every evening | ORAL | Status: DC | PRN
  Administered 2014-01-20 – 2014-01-21 (×2): 50 mg via ORAL
  Filled 2014-01-19 (×9): qty 1

## 2014-01-19 MED ORDER — MAGNESIUM HYDROXIDE 400 MG/5ML PO SUSP: 30.0000 mL | Freq: Every day | ORAL | Status: DC | PRN

## 2014-01-19 MED ORDER — ALUM & MAG HYDROXIDE-SIMETH 200-200-20 MG/5ML PO SUSP: 30.0000 mL | ORAL | Status: DC | PRN

## 2014-01-19 MED ORDER — HYDROXYZINE HCL 25 MG PO TABS: 25.0000 mg | ORAL_TABLET | Freq: Four times a day (QID) | ORAL | Status: DC | PRN

## 2014-01-19 NOTE — Progress Notes (Signed)
Adult Psychoeducational Group Note  Date:  01/19/2014 Time:  10:36 PM  Group Topic/Focus:  Wrap-Up Group:   The focus of this group is to help patients review their daily goal of treatment and discuss progress on daily workbooks.  Participation Level:  Active  Participation Quality:  Appropriate  Affect:  Appropriate  Cognitive:  Alert and Oriented  Insight: Appropriate  Engagement in Group:  Developing/Improving  Modes of Intervention:  Exploration, Problem-solving and Support  Additional Comments:  Patient stated that she just got here on today. Patient stated that she wants to work on opening up more and sharing her story. Patient stated that one coping skill that she was able to use in the past that worked was music. Patient stated that her day was okay.   Marton Malizia, Randal Bubaerri Lee 01/19/2014, 10:36 PM

## 2014-01-19 NOTE — Discharge Summary (Signed)
Physician Discharge Summary  Karen Kim:[id number] DOB: [birth year] DOA: 01/16/2014  PCP: No primary provider on file.  Admit date: 01/16/2014 Discharge date: 01/19/2014  Recommendations for Outpatient Follow-up:  1. Check CBC and BMP in 2 weeks   Discharge Diagnoses: Drug overdose in an suicide attempt  Principal Problem:   Drug overdose Active Problems:   Bipolar affective   Gonorrhea   Hypotension, unspecified   Bradycardia   T wave inversion in EKG  Discharge Condition: stable   Diet recommendation: as tolerated   History of present illness:   Brief Narrative:   18 y.o. female with PMH of depression and recent diagnosis of bipolar disorder, multiple [facility] admissions for suicidal ideation, treated with Prozac, Lexapro, and Abilify by Tamela Oddi at [facility], but who has been noncompliant with her medications, brought to the hospital 01/16/14 after overdosing on approximately 20 Prozac tablets, 20 Lexapro tablets, unspecified amount of doxycycline, Neurontin and Keflex in an intent to harm herself after she broke up with her boyfriend.   Assessment/Plan:   Principal Problem:  Drug overdose  Poison control contacted with recommendations to monitor on telemetry, monitor electrolytes including potassium and magnesium, monitor for QTC prolongation, check 4 hour Tylenol levels (not elevated), and administer IV fluids. Can give benzodiazepines if needed for agitation.  UDS repeated and negative  Potassium and magnesium remain within normal limits.  Continue Recruitment consultant.  Needs inpatient psych placement, pt ready for d/c o Active Problems:  Acute on chronic blood loss anemia  Slight drop in hg since admission  No signs of active bleeding repeat CBC in AM Hypotension / bradycardia / T-wave inversions  Responded to IV fluid bolus. Stop IVF  One set of troponin negative and pt denies chest pain or shortness of breath this AM  Discontinue telemetry  Bipolar  affective / depression  Psychiatric consultation appreciated  Placement to [facility] pending  Pt stable for d/c once bed is available  Gonorrhea  Was treated but did not complete her antibiotic course. Repeat GC/Chlamydia pending.  HIV and RPR nonreactive. UPT negative.   Code Status: Full.  Family Communication: Mother at bedside.  Disposition Plan: Inpatient psych  IV Access:   Peripheral IV Procedures and diagnostic studies:   None. Medical Consultants:   Psychiatry. Other Consultants:   None. Anti-Infectives:   None  Signed: Manson Passey, MD  [facility] 01/19/2014, 2:42 PM  Pager #: [phone]  Discharge Exam: Filed Vitals:   01/19/14 0552  BP: 107/51  Pulse: 64  Temp: 98.1 F (36.7 C)  Resp: 16   Filed Vitals:   01/18/14 0805 01/18/14 1431 01/19/14 0210 01/19/14 0552  BP:  104/60 114/66 107/51  Pulse:  87 62 64  Temp: 97.5 F (36.4 C) 98.1 F (36.7 C) 98.3 F (36.8 C) 98.1 F (36.7 C)  TempSrc:  Oral Oral Oral  Resp:  16 18 16   Height:      Weight:      SpO2:  100% 98% 95%    General: Pt is alert, follows commands appropriately, not in acute distress Cardiovascular: Regular rate and rhythm, S1/S2 +, no murmurs Respiratory: Clear to auscultation bilaterally, no wheezing, no crackles, no rhonchi Abdominal: Soft, non tender, non distended, bowel sounds +, no guarding Extremities: no edema, no cyanosis, pulses palpable bilaterally DP and PT Neuro: Grossly nonfocal  Discharge Instructions  Discharge Instructions   Diet - low sodium heart healthy    Complete by:  As directed  Increase activity slowly    Complete by:  As directed             Medication List    STOP taking these medications       cephALEXin 500 MG capsule  Commonly known as:  KEFLEX     doxycycline 100 MG tablet  Commonly known as:  VIBRA-TABS     escitalopram 5 MG tablet  Commonly known as:  LEXAPRO     FLUoxetine 10 MG tablet  Commonly known as:  PROZAC       TAKE these medications       ABILIFY 5 MG tablet  Generic drug:  ARIPiprazole  Take 2.5 mg by mouth daily.           Follow-up Information   Follow up with Manson PasseyEVINE, Carley Strickling, MD.   Specialty:  Internal Medicine   Contact information:   [address] [place] [address] [phone]        The results of significant diagnostics from this hospitalization (including imaging, microbiology, ancillary and laboratory) are listed below for reference.    Significant Diagnostic Studies: No results found.  Microbiology: Recent Results (from the past 240 hour(s))  GC/CHLAMYDIA PROBE AMP     Status: Abnormal   Collection Time    01/10/14 12:55 PM      Result Value Ref Range Status   CT Probe RNA NEGATIVE  NEGATIVE Final   GC Probe RNA POSITIVE (*) NEGATIVE Final   Comment: (NOTE)     A Positive CT or NG Nucleic Acid Amplification Test (NAAT) result     should be considered presumptive evidence of infection.  The result     should be evaluated along with physical examination and other     diagnostic findings.                                                                                               **Normal Reference Range: Negative**          Assay performed using the Gen-Probe APTIMA COMBO2 (R) Assay.     Acceptable specimen types for this assay include APTIMA Swabs (Unisex,     endocervical, urethral, or vaginal), first void urine, and ThinPrep     liquid based cytology samples.     Performed at [facility]  WET PREP, GENITAL     Status: Abnormal   Collection Time    01/10/14  1:41 PM      Result Value Ref Range Status   Yeast Wet Prep HPF POC NONE SEEN  NONE SEEN Final   Trich, Wet Prep NONE SEEN  NONE SEEN Final   Clue Cells Wet Prep HPF POC NONE SEEN  NONE SEEN Final   WBC, Wet Prep HPF POC FEW (*) NONE SEEN Final  MRSA PCR SCREENING     Status: None   Collection Time    01/16/14  4:49 PM      Result Value Ref Range Status   MRSA by PCR  NEGATIVE  NEGATIVE Final   Comment:  The GeneXpert MRSA Assay (FDA     approved for NASAL specimens     only), is one component of a     comprehensive MRSA colonization     surveillance program. It is not     intended to diagnose MRSA     infection nor to guide or     monitor treatment for     MRSA infections.  GC/CHLAMYDIA PROBE AMP     Status: None   Collection Time    01/17/14  7:52 AM      Result Value Ref Range Status   CT Probe RNA NEGATIVE  NEGATIVE Final   GC Probe RNA NEGATIVE  NEGATIVE Final   Comment: (NOTE)                                                                                               **Normal Reference Range: Negative**          Assay performed using the Gen-Probe APTIMA COMBO2 (R) Assay.     Acceptable specimen types for this assay include APTIMA Swabs (Unisex,     endocervical, urethral, or vaginal), first void urine, and ThinPrep     liquid based cytology samples.     Performed at [facility]: Basic Metabolic Panel:  Recent Labs Lab 01/16/14 1330 01/17/14 0526 01/19/14 0445  NA 141 140 140  K 3.9 4.1 3.8  CL 107 109 107  CO2 23 25 25   GLUCOSE 92 95 92  BUN 10 11 8   CREATININE 0.52 0.52 0.55  CALCIUM 9.2 8.4 8.7  MG 1.8 1.6 1.9   Liver Function Tests:  Recent Labs Lab 01/16/14 1330 01/17/14 0526 01/19/14 0445  AST 11 9 12   ALT 8 6 11   ALKPHOS 51 41 43  BILITOT 0.3 0.2* 0.4  PROT 7.1 5.6* 5.7*  ALBUMIN 3.6 2.7* 2.9*   No results found for this basename: LIPASE, AMYLASE,  in the last 168 hours No results found for this basename: AMMONIA,  in the last 168 hours CBC:  Recent Labs Lab 01/16/14 1330 01/19/14 0445  WBC 6.7 6.5  NEUTROABS 5.0  --   HGB 11.7* 10.7*  HCT 36.5 32.2*  MCV 90.6 90.4  PLT 208 184   Cardiac Enzymes:  Recent Labs Lab 01/17/14 1043  TROPONINI <0.30    Time coordinating discharge: Over 30 minutes

## 2014-01-19 NOTE — Discharge Instructions (Signed)
Suicidal Feelings, How to Help Yourself  Everyone feels sad or unhappy at times, but depressing thoughts and feelings of hopelessness can lead to thoughts of suicide. It can seem as if life is too tough to handle. If you feel as though you have reached the point where suicide is the only answer, it is time to let someone know immediately.   HOW TO COPE AND PREVENT SUICIDE   Let family, friends, teachers, or counselors know. Get help. Try not to isolate yourself from those who care about you. Even though you may not feel sociable, talk with someone every day. It is best if it is face-to-face. Remember, they will want to help you.   Eat a regularly spaced and well-balanced diet.   Get plenty of rest.   Avoid alcohol and drugs because they will only make you feel worse and may also lower your inhibitions. Remove them from the home. If you are thinking of taking an overdose of your prescribed medicines, give your medicines to someone who can give them to you one day at a time. If you are on antidepressants, let your caregiver know of your feelings so he or she can provide a safer medicine, if that is a concern.   Remove weapons or poisons from your home.   Try to stick to routines. Follow a schedule and remind yourself that you have to keep that schedule every day.   Set some realistic goals and achieve them. Make a list and cross things off as you go. Accomplishments give a sense of worth. Wait until you are feeling better before doing things you find difficult or unpleasant to do.   If you are able, try to start exercising. Even half-hour periods of exercise each day will make you feel better. Getting out in the sun or into nature helps you recover from depression faster. If you have a favorite place to walk, take advantage of that.   Increase safe activities that have always given you pleasure. This may include playing your favorite music, reading a good book, painting a picture, or playing your favorite  instrument. Do whatever takes your mind off your depression.   Keep your living space well-lighted.  GET HELP  Contact a suicide hotline, crisis center, or local suicide prevention center for help right away. Local centers may include a hospital, clinic, community service organization, social service provider, or health department.   Call your local emergency services (911 in the [place]).   Call a suicide hotline:   1-800-273-TALK (1-800-273-8255) in the [place].   1-800-SUICIDE (1-800-784-2433) in the [place].   1-888-628-9454 in the [place] for Spanish-speaking counselors.   1-800-799-4TTY (1-800-799-4889) in the [place] for TTY users.   Visit the following websites for information and help:   National Suicide Prevention Lifeline: www.suicidepreventionlifeline.org   Hopeline: www.hopeline.com   American Foundation for Suicide Prevention: www.afsp.org   For lesbian, gay, bisexual, transgender, or questioning youth, contact The Trevor Project:   1-866-4-U-TREVOR (1-866-488-7386) in the [place].   www.thetrevorproject.org   In [place], treatment resources are listed in each province with listings available under [employer] or similar titles. Another source for Crisis Centres by Province is located at http://www.suicideprevention.ca/in-crisis-now/find-a-crisis-centre-now/crisis-centres  Document Released: 12/23/2002 Document Revised: 09/10/2011 Document Reviewed: 05/13/2007  ExitCare Patient Information 2015 [employer]. This information is not intended to replace advice given to you by your health care provider. Make sure you discuss any questions you have with your health   care provider.

## 2014-01-19 NOTE — Progress Notes (Signed)
Clinical Social Work  [id number] RN to call report to [phone]. [facility] agreeable to accept after 4pm. CSW went to room and explained DC plans to patient. CSW explained [employer] would provide transportation since she was placed under IVC. Patient asked CSW to contact her mother and informed her of DC plans. CSW and patient spoke with mother via phone. Patient reports she would prefer to return home but understanding of plans.  CSW spoke with [employer] who will provide transportation. RN and MD aware of plans. CSW is signing off but available if needed.  [place] Star[place]olly [place], [place] [phone]

## 2014-01-19 NOTE — Progress Notes (Signed)
Writer called Karen HawJohn Withrow NP to received orders at 1730 hrs. NP stated that he will place orders in for this patient.

## 2014-01-19 NOTE — Progress Notes (Signed)
Care assumed from off going RN. No changes in initial AM assessment at this time. Sitter safety check completed. Sitter at bedside. [facility]. Paperwork on the chart as required.. Cont with plan of care

## 2014-01-19 NOTE — Progress Notes (Signed)
[facility] [employer] [facility].  Pt cooperative placed in hand cuffs and . Condition stable.

## 2014-01-19 NOTE — Progress Notes (Signed)
Reviewed discharge papers with patient. PIV was removed. Report was called to behavior health. There were no further questions.

## 2014-01-19 NOTE — Tx Team (Signed)
Initial Interdisciplinary Treatment Plan  PATIENT STRENGTHS: (choose at least two) Average or above average intelligence General fund of knowledge Supportive family/friends  PATIENT STRESSORS: Loss of relationship with boyfriend Medication change or noncompliance   PROBLEM LIST: Problem List/Patient Goals Date to be addressed Date deferred Reason deferred Estimated date of resolution  depression 01/19/2014                                                       DISCHARGE CRITERIA:  Improved stabilization in mood, thinking, and/or behavior Need for constant or close observation no longer present Reduction of life-threatening or endangering symptoms to within safe limits  PRELIMINARY DISCHARGE PLAN: Outpatient therapy Return to previous living arrangement  PATIENT/FAMIILY INVOLVEMENT: This treatment plan has been presented to and reviewed with the patient, Karen Kim.  The patient and family have been given the opportunity to ask questions and make suggestions.  Leighton Parodyyson, [name] M 01/19/2014, 5:56 PM

## 2014-01-19 NOTE — Progress Notes (Signed)
Clinical Social Work  Per chart review, patient is medically stable to DC today. CSW contacted the following facilities re: placement:  [facility]- no available beds  [facility] who will review information and call CSW with determination  [facility]- no [facility] beds  [facility]- [facility] with [facility]  [facility]- not in network with insurance  CSW will continue to follow to assist with placement.  Negley[place]olly Ashanti [place], [place] [phone]

## 2014-01-19 NOTE — Progress Notes (Addendum)
Patient ID: Karen Kim, female   DOB: 06-15-1996, 18 y.o.   MRN: [id number]  [facility] PROGRESS NOTE  Karen Kim:[id number] DOB: [birth year] DOA: 01/16/2014 PCP: No primary provider on file.  Brief Narrative:   18 y.o. female with PMH of depression and recent diagnosis of bipolar disorder, multiple [facility] admissions for suicidal ideation, treated with Prozac, Lexapro, and Abilify by Tamela Oddi at [facility], but who has been noncompliant with her medications, brought to the hospital 01/16/14 after overdosing on approximately 20 Prozac tablets, 20 Lexapro tablets, unspecified amount of doxycycline, Neurontin and Keflex in an intent to harm herself after she broke up with her boyfriend.   Assessment/Plan:   Principal Problem:  Drug overdose  Poison control contacted with recommendations to monitor on telemetry, monitor electrolytes including potassium and magnesium, monitor for QTC prolongation, check 4 hour Tylenol levels (not elevated), and administer IV fluids. Can give benzodiazepines if needed for agitation. UDS repeated and negative  Potassium and magnesium remain within normal limits.  Continue Recruitment consultant.  Needs inpatient psych placement when bed available, pt ready for d/c once bed available  Active Problems:  Acute on chronic blood loss anemia  Slight drop in hg since admission  No signs of active bleeding repeat CBC in AM Hypotension / bradycardia / T-wave inversions  Responded to IV fluid bolus. Stop IVF One set of troponin negative and pt denies chest pain or shortness of breath this AM  Discontinue telemetry  Bipolar affective / depression  Psychiatric consultation appreciated  Placement to [facility] pending  Pt stable for d/c once bed is available  Gonorrhea  Was treated but did not complete her antibiotic course. Repeat GC/Chlamydia pending.  HIV and RPR nonreactive. UPT negative. DVT prophylaxis  Lovenox ordered while inpatient   Code Status:  Full.  Family Communication: Mother at bedside.  Disposition Plan: Inpatient psych, placement pending   IV Access:   Peripheral IV Procedures and diagnostic studies:   None. Medical Consultants:   Psychiatry. Other Consultants:   None. Anti-Infectives:   None  Manson Passey, MD  [facility] Pager [phone]  If 7PM-7AM, please contact night-coverage [email or web address] Password [email or web address] 01/19/2014, 10:18 AM   LOS: 3 days    HPI/Subjective: No acute overnight events.  Objective: Filed Vitals:   01/18/14 0805 01/18/14 1431 01/19/14 0210 01/19/14 0552  BP:  104/60 114/66 107/51  Pulse:  87 62 64  Temp: 97.5 F (36.4 C) 98.1 F (36.7 C) 98.3 F (36.8 C) 98.1 F (36.7 C)  TempSrc:  Oral Oral Oral  Resp:  16 18 16   Height:      Weight:      SpO2:  100% 98% 95%    Intake/Output Summary (Last 24 hours) at 01/19/14 1018 Last data filed at 01/19/14 0600  Gross per 24 hour  Intake   1360 ml  Output    300 ml  Net   1060 ml    Exam:   General:  Pt is alert, follows commands appropriately, not in acute distress  Cardiovascular: Regular rate and rhythm, S1/S2, no murmurs  Respiratory: Clear to auscultation bilaterally, no wheezing, no crackles, no rhonchi  Abdomen: Soft, non tender, non distended, bowel sounds present  Extremities: No edema, pulses DP and PT palpable bilaterally  Neuro: Grossly nonfocal  Data Reviewed: Basic Metabolic Panel:  Recent Labs Lab 01/16/14 1330 01/17/14 0526 01/19/14 0445  NA 141 140 140  K 3.9 4.1 3.8  CL 107 109 107  CO2 23  25 25  GLUCOSE 92 95 92  BUN 10 11 8   CREATININE 0.52 0.52 0.55  CALCIUM 9.2 8.4 8.7  MG 1.8 1.6 1.9   Liver Function Tests:  Recent Labs Lab 01/16/14 1330 01/17/14 0526 01/19/14 0445  AST 11 9 12   ALT 8 6 11   ALKPHOS 51 41 43  BILITOT 0.3 0.2* 0.4  PROT 7.1 5.6* 5.7*  ALBUMIN 3.6 2.7* 2.9*   CBC:  Recent Labs Lab 01/16/14 1330 01/19/14 0445  WBC 6.7 6.5  NEUTROABS 5.0  --   HGB  11.7* 10.7*  HCT 36.5 32.2*  MCV 90.6 90.4  PLT 208 184   Cardiac Enzymes:  Recent Labs Lab 01/17/14 1043  TROPONINI <0.30    Recent Results (from the past 240 hour(s))  GC/CHLAMYDIA PROBE AMP     Status: Abnormal   Collection Time    01/10/14 12:55 PM      Result Value Ref Range Status   CT Probe RNA NEGATIVE  NEGATIVE Final   GC Probe RNA POSITIVE (*) NEGATIVE Final   Comment: (NOTE)     A Positive CT or NG Nucleic Acid Amplification Test (NAAT) result     should be considered presumptive evidence of infection.  The result     should be evaluated along with physical examination and other     diagnostic findings.                                                                                               **Normal Reference Range: Negative**          Assay performed using the Gen-Probe APTIMA COMBO2 (R) Assay.     Acceptable specimen types for this assay include APTIMA Swabs (Unisex,     endocervical, urethral, or vaginal), first void urine, and ThinPrep     liquid based cytology samples.     Performed at [facility]  WET PREP, GENITAL     Status: Abnormal   Collection Time    01/10/14  1:41 PM      Result Value Ref Range Status   Yeast Wet Prep HPF POC NONE SEEN  NONE SEEN Final   Trich, Wet Prep NONE SEEN  NONE SEEN Final   Clue Cells Wet Prep HPF POC NONE SEEN  NONE SEEN Final   WBC, Wet Prep HPF POC FEW (*) NONE SEEN Final  MRSA PCR SCREENING     Status: None   Collection Time    01/16/14  4:49 PM      Result Value Ref Range Status   MRSA by PCR NEGATIVE  NEGATIVE Final   Comment:            The GeneXpert MRSA Assay (FDA     approved for NASAL specimens     only), is one component of a     comprehensive MRSA colonization     surveillance program. It is not     intended to diagnose MRSA     infection nor to guide or     monitor treatment for     MRSA infections.  GC/CHLAMYDIA PROBE  AMP     Status: None   Collection Time    01/17/14  7:52 AM       Result Value Ref Range Status   CT Probe RNA NEGATIVE  NEGATIVE Final   GC Probe RNA NEGATIVE  NEGATIVE Final   Comment: (NOTE)                                                                                               **Normal Reference Range: Negative**          Assay performed using the Gen-Probe APTIMA COMBO2 (R) Assay.     Acceptable specimen types for this assay include APTIMA Swabs (Unisex,     endocervical, urethral, or vaginal), first void urine, and ThinPrep     liquid based cytology samples.     Performed at [facility]     Studies: No results found.  Scheduled Meds: . enoxaparin (LOVENOX) injection  40 mg Subcutaneous Q24H  . sodium chloride  3 mL Intravenous Q12H   Continuous Infusions: . sodium chloride 50 mL/hr at 01/17/14 1218

## 2014-01-20 DIAGNOSIS — F332 Major depressive disorder, recurrent severe without psychotic features: Principal | ICD-10-CM

## 2014-01-20 MED ORDER — ARIPIPRAZOLE 5 MG PO TABS: 2.5000 mg | ORAL_TABLET | Freq: Every day | ORAL | Status: DC

## 2014-01-20 MED ORDER — LAMOTRIGINE 25 MG PO TABS
25.0000 mg | ORAL_TABLET | Freq: Every day | ORAL | Status: DC
  Administered 2014-01-20 – 2014-01-21 (×2): 25 mg via ORAL
  Filled 2014-01-20 (×5): qty 1

## 2014-01-20 MED ORDER — ARIPIPRAZOLE 5 MG PO TABS
5.0000 mg | ORAL_TABLET | Freq: Every day | ORAL | Status: DC
  Administered 2014-01-20 – 2014-01-21 (×2): 5 mg via ORAL
  Filled 2014-01-20 (×6): qty 1

## 2014-01-20 NOTE — [facility] Group Notes (Signed)
[facility] LCSW Aftercare Discharge Planning Group Note   01/20/2014 10:18 AM  Participation Quality:  DID NOT ATTEND-pt in room resting.   Smart, Bridgitte Felicetti LCSWA    

## 2014-01-20 NOTE — Progress Notes (Signed)
Patient ID: Karen DrownMarissa J Kim, female   DOB: Jan 06, 1996, 18 y.o.   MRN: [id number]  D: Pt was observed in the dayroom with her peers, couldn't tell whether or not she was interacting with her peers. Writer discussed pt's meds that are to be given tonight and pt agreed that she wanted to take her scheduled sleep med. Writer informed pt that she should likely attend group on the 500 hall because she has no substance issues. Pt agreed to attend group on 500 hall, but came back to 300 and informed the writer that it was "too crowded". Stated, "she feels strange down there".   A:  Support and encouragement was offered. 15 min checks continued for safety.  R: Pt remains safe.
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
## 2014-01-20 NOTE — Progress Notes (Signed)
Attended group 
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
## 2014-01-20 NOTE — Progress Notes (Signed)
Pt's mother came and gave info to the Clinical research associatewriter. Stated she knows her daughter will not tell the "whole story".  Mom stated the pt began seeing a psychiatrist 3 months because she began having problems with school.  Pt's mother stated pt's prozac was changed to lexapro. Mother is concerned pt may still have gonorrhea. Pt's mother was informed that pt does not have her down to receive info.

## 2014-01-20 NOTE — Progress Notes (Signed)
Patient ID: Karen DrownMarissa J Kim, female   DOB: 1995-11-26, 18 y.o.   MRN: [id number] She has been up  part of the day. Interacting with peers and staff. Denies SI thoughts.  Has not requested any prn medications. Has gone to groups.

## 2014-01-20 NOTE — Progress Notes (Signed)
Adult Psychoeducational Group Note  Date:  01/20/2014 Time:  1:45 PM  Group Topic/Focus:  Goals Group:   The focus of this group is to help patients establish daily goals to achieve during treatment and discuss how the patient can incorporate goal setting into their daily lives to aide in recovery.  Participation Level:  Active  Participation Quality:  Appropriate  Affect:  Appropriate  Cognitive:  Appropriate  Insight: Appropriate  Engagement in Group:  Engaged  Modes of Intervention:  Discussion  Additional Comments:  Pt stated that her cat is where she values, she stated that it is a comfort item in her life and the one things that makes her happy.  Terie PurserParker, Bryndon Cumbie R 01/20/2014, 1:45 PM

## 2014-01-20 NOTE — [facility] Suicide Risk Assessment (Signed)
   Nursing information obtained from:  Patient Demographic factors:  Adolescent or young adult;Caucasian;Low socioeconomic status;Unemployed Current Mental Status:  Suicidal ideation indicated by patient;Self-harm thoughts;Self-harm behaviors Loss Factors:  Loss of significant relationship Historical Factors:  NA Risk Reduction Factors:  Sense of responsibility to family Total Time spent with patient: 45 minutes  CLINICAL FACTORS:   Bipolar Disorder:   Depressive phase More than one psychiatric diagnosis Unstable or Poor Therapeutic Relationship  Psychiatric Specialty Exam: Physical Exam  ROS  Blood pressure 118/61, pulse 59, temperature 98.2 F (36.8 C), temperature source Oral, resp. rate 16.There is no weight on file to calculate BMI.  General Appearance: Casual  Eye Contact::  Fair  Speech:  Normal Rate  Volume:  Normal  Mood:  Depressed and Irritable  Affect:  Labile  Thought Process:  Circumstantial and Loose  Orientation:  Full (Time, Place, and Person)  Thought Content:  Rumination  Suicidal Thoughts:  Yes.  with intent/plan  Homicidal Thoughts:  No  Memory:  Immediate;   Fair Recent;   Fair Remote;   Good  Judgement:  Impaired  Insight:  Lacking  Psychomotor Activity:  Increased  Concentration:  Fair  Recall:  FiservFair  Fund of Knowledge:Fair  Language: Fair  Akathisia:  No  Handed:  Right  AIMS (if indicated):     Assets:  Communication Skills Housing  Sleep:  Number of Hours: 6.75   Musculoskeletal: Strength & Muscle Tone: within normal limits Gait & Station: normal Patient leans: N/A  COGNITIVE FEATURES THAT CONTRIBUTE TO RISK:  Closed-mindedness Loss of executive function Polarized thinking Thought constriction (tunnel vision)    SUICIDE RISK:   Severe:  Frequent, intense, and enduring suicidal ideation, specific plan, no subjective intent, but some objective markers of intent (i.e., choice of lethal method), the method is accessible, some limited  preparatory behavior, evidence of impaired self-control, severe dysphoria/symptomatology, multiple risk factors present, and few if any protective factors, particularly a lack of social support.  PLAN OF CARE:  I certify that inpatient services furnished can reasonably be expected to improve the patient's condition.  Ananiah Maciolek T. 01/20/2014, 5:46 PM

## 2014-01-21 DIAGNOSIS — F329 Major depressive disorder, single episode, unspecified: Secondary | ICD-10-CM

## 2014-01-21 MED ORDER — FLUCONAZOLE 100 MG PO TABS
150.0000 mg | ORAL_TABLET | Freq: Once | ORAL | Status: DC
  Filled 2014-01-21: qty 1.5

## 2014-01-21 NOTE — Progress Notes (Signed)
[facility] MD Progress Note  01/21/2014 12:57 PM Karen Kim  MRN:  [id number] Subjective:  18 year old female admitted to [facility] after "taking too many pills".  She informs Clinical research associate that she was not trying to kill herself but instead was trying to get high.  She is unsure what pills she took.  She reports feeling better today.  Denies SI, HI, or AVH.  Questions Clinical research associate as to when she may be able to go home.  Abilify 5mg  was increased on 01/20/2014 and Lamictal 25mg  added. She reports that she has been attending groups "except for the one this morning".    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 rate 16.There is no weight on file to calculate BMI.  General Appearance: Well Groomed  Patent attorney::  Good  Speech:  Clear and Coherent  Volume:  Normal  Mood:  Negative  Affect:  Appropriate  Thought Process:  Intact  Orientation:  Full (Time, Place, and Person)  Thought Content:  WDL  Suicidal Thoughts:  No  Homicidal Thoughts:  No  Memory:  Immediate;   Good Recent;   Good  Judgement:  Fair  Insight:  Fair  Psychomotor Activity:  Normal  Concentration:  Good  Recall:  Good  Fund of Knowledge:Fair  Language: Good  Akathisia:  Negative  Handed:    AIMS (if indicated):     Assets:  Manufacturing systems engineer Physical Health Social Support  Sleep:  Number of Hours: 6.75   Musculoskeletal: Strength & Muscle Tone: within normal limits Gait & Station: normal Patient leans: N/A  Current Medications: Current Facility-Administered Medications  Medication Dose Route Frequency Provider Last Rate Last Dose  . acetaminophen (TYLENOL) tablet 650 mg  650 mg  Oral Q6H PRN Kerry Hough, PA-C      . alum & mag hydroxide-simeth (MAALOX/MYLANTA) 200-200-20 MG/5ML suspension 30 mL  30 mL Oral Q4H PRN Kerry Hough, PA-C      . ARIPiprazole (ABILIFY) tablet 5 mg  5 mg Oral Daily [name], NP   5 mg at 01/20/14 1348  . hydrOXYzine (ATARAX/VISTARIL) tablet 25 mg  25 mg Oral Q6H PRN Kerry Hough, PA-C      . lamoTRIgine (LAMICTAL) tablet 25 mg  25 mg Oral Daily [name], NP   25 mg at 01/20/14 1348  . magnesium hydroxide (MILK OF MAGNESIA) suspension 30 mL  30 mL Oral Daily PRN Kerry Hough, PA-C      . traZODone (DESYREL) tablet 50 mg  50 mg Oral QHS,MR X 1 Kerry Hough, PA-C   50 mg at 01/20/14 2141    Lab Results: No results found for this or any previous visit (from the past 48 hour(s)).  Physical Findings: AIMS: Facial and Oral Movements Muscles of Facial Expression: None, normal Lips and Perioral Area: None, normal Jaw: None, normal Tongue: None, normal,Extremity Movements Upper (arms, wrists, hands, fingers): None, normal Lower (legs, knees, ankles, toes): None, normal, Trunk Movements Neck, shoulders, hips: None, normal, Overall Severity Severity of abnormal movements (highest score from questions above): None, normal Incapacitation due to abnormal movements: None, normal Patient's awareness of abnormal movements (rate only  patient's report): No Awareness,    CIWA:    COWS:     Treatment Plan Summary: Daily contact with patient to assess and evaluate symptoms and progress in treatment Medication management  Plan: 1) Continue current medication regimen  2) Attend daily groups  3) Crisis management and stabilization 4) Discussed treatment 3-5 days  5) Continue to monitor 6) Discussed with patient risks of overdosing and not taking medications as prescribed 7) Medication management to reduce current symptoms to base line and improve patient's overall level of functioning.  Medical Decision Making Problem Points:   Established problem, stable/improving (1) Data Points:  Review of medication regiment & side effects (2) Review of new medications or change in dosage (2)  I certify that inpatient services furnished can reasonably be expected to improve the patient's condition.   Kizzie FantasiaBURKETT, Luceal Hollibaugh CORI 01/21/2014, 12:57 PM

## 2014-01-21 NOTE — Progress Notes (Addendum)
Patient ID: Karen DrownMarissa J Kim, female   DOB: 02/04/1996, 18 y.o.   MRN: [id number] She has been in the bed sleeping most of the shift was to part of one group.She says that she is not having any distress that she is fine. Self inventory: depression 2, hopelessness 0, anxiety 5. Denies SI thoughts, w/d symptoms N/A , and she enies SI thoughts and voices. Goal is to go to group.

## 2014-01-21 NOTE — [facility] Group Notes (Signed)
0900 nursing orientation group   The focus of this group is to educate the patient on the purpose and policies of crisis stabilization and provide a format to answer questions about their admission.  The group details unit policies and expectations of patients while admitted.  Pt did not attend she was in her bed and refused to get up for group.

## 2014-01-21 NOTE — [facility] Group Notes (Signed)
[facility] LCSW Group Therapy  01/21/2014 3:08 PM  Type of Therapy:  Group Therapy  Participation Level:  Did Not Attend-pt in room resting   Smart, Karen Kim LCSWA  01/21/2014, 3:08 PM

## 2014-01-22 DIAGNOSIS — F3130 Bipolar disorder, current episode depressed, mild or moderate severity, unspecified: Secondary | ICD-10-CM

## 2014-01-22 MED ORDER — LAMOTRIGINE 25 MG PO TABS: 25.0000 mg | ORAL_TABLET | Freq: Every day | ORAL | Status: DC

## 2014-01-22 MED ORDER — HYDROXYZINE HCL 25 MG PO TABS: 25.0000 mg | ORAL_TABLET | Freq: Four times a day (QID) | ORAL | Status: DC | PRN

## 2014-01-22 MED ORDER — TRAZODONE HCL 50 MG PO TABS: 50.0000 mg | ORAL_TABLET | Freq: Every evening | ORAL | Status: DC | PRN

## 2014-01-22 MED ORDER — ARIPIPRAZOLE 5 MG PO TABS: 5.0000 mg | ORAL_TABLET | Freq: Every day | ORAL | Status: DC

## 2014-01-22 NOTE — [facility] Group Notes (Signed)
[facility]   01/22/2014 9:23 AM  Participation Quality:  DID NOT ATTEND-pt in room resting.   Smart, American FinancialHeather LCSWA

## 2014-01-22 NOTE — [facility] Suicide Risk Assessment (Signed)
[facility] INPATIENT:  Family/Significant Other Suicide Prevention Education  Suicide Prevention Education:  Education Completed; Angie Schaumburg(Pt's mother [phone]) has been identified by the patient as the family member/significant other with whom the patient will be residing, and identified as the person(s) who will aid the patient in the event of a mental health crisis (suicidal ideations/suicide attempt).  With written consent from the patient, the family member/significant other has been provided the following suicide prevention education, prior to the and/or following the discharge of the patient.  The suicide prevention education provided includes the following:  Suicide risk factors  Suicide prevention and interventions  National Suicide Hotline telephone number  [facility] assessment telephone number  [facility] Assistance 911  [facility] and/or Residential Mobile Crisis Unit telephone number  Request made of family/significant other to:  Remove weapons (e.g., guns, rifles, knives), all items previously/currently identified as safety concern.    Remove drugs/medications (over-the-counter, prescriptions, illicit drugs), all items previously/currently identified as a safety concern.  The family member/significant other verbalizes understanding of the suicide prevention education information provided.  The family member/significant other agrees to remove the items of safety concern listed above.  Smart, Alexandre Lightsey LCSWA  01/22/2014, 12:00 PM

## 2014-01-22 NOTE — Progress Notes (Signed)
[facility] :  Will you be returning to the same living situation after discharge: Yes,  home with mom At discharge, do you have transportation home?:Yes,  pt's mother coming at 6pm to pick up pt Do you have the ability to pay for your medications:Yes,  [facility]  Release of information consent forms completed and submitted to Medical Records by CSW.  Patient to Follow up at: Follow-up Information   Follow up with [facility] On 02/02/2014. (Appt. with Roger at 1:00PM for therapy. (no appts available next week for times that patient's mother requested). Please call to cancel/reschedule if needed. )    Contact information:   [address] [place], [place] [address] Phone: [phone] Fax: [phone]      Follow up with [facility] On 03/02/2014. (Appt with Starling MannsJoe Hughes at 2:00PM for medication management. )    Contact information:   [address] [place], [place] [address] Phone: [phone] Fax: [phone]      Patient denies SI/HI:   Yes,  during self report.    Safety Planning and Suicide Prevention discussed:  Yes,  SPE completed with pt's mother. SPI pamphlet provided to pt and she was encouraged to share information with support network, ask questions, and talk about any concerns relating to SPE.  Smart, Jefrey Raburn LCSWA  01/22/2014, 12:01 PM

## 2014-01-22 NOTE — [facility] Suicide Risk Assessment (Signed)
   Demographic Factors:  NA  Total Time spent with patient: 30 minutes  Psychiatric Specialty Exam: Physical Exam  ROS  Blood pressure 94/60, pulse 104, temperature 98.6 F (37 C), temperature source Oral, resp. rate 16.There is no weight on file to calculate BMI.  General Appearance: Casual  Eye Contact::  Good  Speech:  Clear and Coherent  Volume:  Normal  Mood:  Euthymic  Affect:  Appropriate and Congruent  Thought Process:  Coherent and Logical  Orientation:  Full (Time, Place, and Person)  Thought Content:  WDL  Suicidal Thoughts:  No  Homicidal Thoughts:  No  Memory:  Immediate;   Fair Recent;   Good Remote;   Good  Judgement:  Intact  Insight:  Good  Psychomotor Activity:  Normal  Concentration:  Good  Recall:  Good  Fund of Knowledge:Good  Language: Good  Akathisia:  No  Handed:  Right  AIMS (if indicated):     Assets:  Communication Skills Desire for Improvement Financial Resources/Insurance Housing Social Support  Sleep:  Number of Hours: 6.25    Musculoskeletal: Strength & Muscle Tone: within normal limits Gait & Station: normal Patient leans: N/A   Mental Status Per Nursing Assessment::   On Admission:  Suicidal ideation indicated by patient;Self-harm thoughts;Self-harm behaviors  Current Mental Status by Physician: NA  Loss Factors: Loss of significant relationship and Financial problems/change in socioeconomic status  Historical Factors: Prior suicide attempts and Impulsivity  Risk Reduction Factors:   Sense of responsibility to family, Religious beliefs about death, Living with another person, especially a relative, Positive social support, Positive therapeutic relationship and Positive coping skills or problem solving skills  Continued Clinical Symptoms:  Bipolar Disorder:   Depressive phase  Cognitive Features That Contribute To Risk:  Polarized thinking    Suicide Risk:  Mild:  Suicidal ideation of limited frequency, intensity,  duration, and specificity.  There are no identifiable plans, no associated intent, mild dysphoria and related symptoms, good self-control (both objective and subjective assessment), few other risk factors, and identifiable protective factors, including available and accessible social support.  Discharge Diagnoses:   AXIS I:  Bipolar, Depressed AXIS II:  Deferred AXIS III:   Past Medical History  Diagnosis Date  . Scoliosis   . MRSA (methicillin resistant staph aureus) culture positive   . Bipolar affective   . Depression   . Gonorrhea    AXIS IV:  other psychosocial or environmental problems and problems related to social environment AXIS V:  61-70 mild symptoms  Plan Of Care/Follow-up recommendations:  Activity:  as tolerated Diet:  unchanged from past  Is patient on multiple antipsychotic therapies at discharge:  No   Has Patient had three or more failed trials of antipsychotic monotherapy by history:  No  Recommended Plan for Multiple Antipsychotic Therapies: NA    Charnice Zwilling T. 01/22/2014, 11:41 AM

## 2014-01-22 NOTE — Progress Notes (Addendum)
Patient ID: Karen DrownMarissa J Kim, female   DOB: Dec 17, 1995, 18 y.o.   MRN: [id number]   Pt was discharged home with mother and boyfriend, pt reported no issues or concerns at time of discharge. Pt reported on her self inventory sheet that her depression was a 0 and her helplessness was a 0. Pt reported being negative SI/HI, no AH/VH noted.

## 2014-01-22 NOTE — Progress Notes (Signed)
Adult Psychoeducational Group Note  Date:  01/22/2014 Time:  11:30 AM  Group Topic/Focus:  Relapse Prevention Planning:   The focus of this group is to define relapse and discuss the need for planning to combat relapse.  Participation Level:  None  Participation Quality:  Drowsy  Affect:  Flat and Resistant  Cognitive:  Alert and Oriented  Insight: Appropriate  Engagement in Group:  None and Resistant  Modes of Intervention:  Discussion and Education  Additional Comments:  Pt attended group but did not participate. Pt was appropriate throughout group.   Dalia HeadingSeeley, Caeli Linehan Aileen 01/22/2014, 11:30 AM

## 2014-01-22 NOTE — Progress Notes (Signed)
D   Pt is pleasant on approach   She appears anxious and sad   She has been going from phone to phone calling her boyfriend and having strong conversation with him   She is compliant with treatment    A   Verbal support given   Medications administered and effectiveness monitored   Q 15 min checks R   Pt safe at present
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
## 2014-01-27 NOTE — Progress Notes (Signed)
Patient Discharge Instructions:  After Visit Summary (AVS):   Faxed to:  01/27/14 Discharge Summary Note:   Faxed to:  01/27/14 Psychiatric Admission Assessment Note:   Faxed to:  01/27/14 Suicide Risk Assessment - Discharge Assessment:   Faxed to:  01/27/14 Faxed/Sent to the Next Level Care provider:  01/27/14 Faxed to [facility] @ [phone] Jerelene ReddenSheena E Colfax, 01/27/2014, 3:58 PM

## 2014-02-18 ENCOUNTER — Emergency Department (HOSPITAL_COMMUNITY)
Admission: EM | Admit: 2014-02-18 | Discharge: 2014-02-18 | Disposition: A | Discharge status: Home / Self Care | Payer: Medicaid Other | Attending: Emergency Medicine | Admitting: Emergency Medicine

## 2014-02-18 ENCOUNTER — Encounter (HOSPITAL_COMMUNITY): Payer: Self-pay | Admitting: Emergency Medicine

## 2014-02-18 DIAGNOSIS — Z79899 Other long term (current) drug therapy: Secondary | ICD-10-CM | POA: Insufficient documentation

## 2014-02-18 DIAGNOSIS — Z8739 Personal history of other diseases of the musculoskeletal system and connective tissue: Secondary | ICD-10-CM | POA: Insufficient documentation

## 2014-02-18 DIAGNOSIS — Z3202 Encounter for pregnancy test, result negative: Secondary | ICD-10-CM | POA: Insufficient documentation

## 2014-02-18 DIAGNOSIS — F319 Bipolar disorder, unspecified: Secondary | ICD-10-CM | POA: Insufficient documentation

## 2014-02-18 DIAGNOSIS — Z046 Encounter for general psychiatric examination, requested by authority: Secondary | ICD-10-CM | POA: Insufficient documentation

## 2014-02-18 DIAGNOSIS — Z8614 Personal history of Methicillin resistant Staphylococcus aureus infection: Secondary | ICD-10-CM | POA: Insufficient documentation

## 2014-02-18 DIAGNOSIS — R45851 Suicidal ideations: Secondary | ICD-10-CM | POA: Insufficient documentation

## 2014-02-18 DIAGNOSIS — F4321 Adjustment disorder with depressed mood: Secondary | ICD-10-CM

## 2014-02-18 DIAGNOSIS — Z8619 Personal history of other infectious and parasitic diseases: Secondary | ICD-10-CM | POA: Insufficient documentation

## 2014-02-18 LAB — COMPREHENSIVE METABOLIC PANEL
ALK PHOS: 66 U/L (ref 39–117)
ALT: 10 U/L (ref 0–35)
ANION GAP: 13 (ref 5–15)
AST: 13 U/L (ref 0–37)
Albumin: 4.1 g/dL (ref 3.5–5.2)
BILIRUBIN TOTAL: 0.9 mg/dL (ref 0.3–1.2)
BUN: 13 mg/dL (ref 6–23)
CHLORIDE: 103 meq/L (ref 96–112)
CO2: 25 mEq/L (ref 19–32)
Calcium: 9.8 mg/dL (ref 8.4–10.5)
Creatinine, Ser: 0.58 mg/dL (ref 0.50–1.10)
GFR calc Af Amer: >90 mL/min (ref >90)
GFR calc non Af Amer: >90 mL/min (ref >90)
Glucose, Bld: 112 mg/dL — ABNORMAL HIGH (ref 70–99)
Potassium: 3.8 mEq/L (ref 3.7–5.3)
SODIUM: 141 meq/L (ref 137–147)
TOTAL PROTEIN: 8.2 g/dL (ref 6.0–8.3)

## 2014-02-18 LAB — ETHANOL: Alcohol, Ethyl (B): <11 mg/dL (ref 0–11)

## 2014-02-18 LAB — RAPID URINE DRUG SCREEN, HOSP PERFORMED
AMPHETAMINES: NOT DETECTED
BARBITURATES: NOT DETECTED
BENZODIAZEPINES: NOT DETECTED
COCAINE: NOT DETECTED
OPIATES: NOT DETECTED
TETRAHYDROCANNABINOL: POSITIVE — AB

## 2014-02-18 LAB — CBC
HEMATOCRIT: 40.7 % (ref 36.0–46.0)
Hemoglobin: 13.7 g/dL (ref 12.0–15.0)
MCH: 30 pg (ref 26.0–34.0)
MCHC: 33.7 g/dL (ref 30.0–36.0)
MCV: 89.1 fL (ref 78.0–100.0)
PLATELETS: 279 10*3/uL (ref 150–400)
RBC: 4.57 MIL/uL (ref 3.87–5.11)
RDW: 13.8 % (ref 11.5–15.5)
WBC: 9.9 10*3/uL (ref 4.0–10.5)

## 2014-02-18 LAB — SALICYLATE LEVEL: Salicylate Lvl: <2 mg/dL — ABNORMAL LOW (ref 2.8–20.0)

## 2014-02-18 LAB — PREGNANCY, URINE: Preg Test, Ur: NEGATIVE

## 2014-02-18 LAB — ACETAMINOPHEN LEVEL: Acetaminophen (Tylenol), Serum: <15 ug/mL (ref 10–30)

## 2014-02-18 MED ORDER — ACETAMINOPHEN 325 MG PO TABS: 650.0000 mg | ORAL_TABLET | ORAL | Status: DC | PRN

## 2014-02-18 MED ORDER — ALUM & MAG HYDROXIDE-SIMETH 200-200-20 MG/5ML PO SUSP: 30.0000 mL | ORAL | Status: DC | PRN

## 2014-02-18 MED ORDER — ONDANSETRON HCL 4 MG PO TABS: 4.0000 mg | ORAL_TABLET | Freq: Three times a day (TID) | ORAL | Status: DC | PRN

## 2014-02-18 MED ORDER — LORAZEPAM 1 MG PO TABS
1.0000 mg | ORAL_TABLET | Freq: Three times a day (TID) | ORAL | Status: DC | PRN
  Administered 2014-02-18: 1 mg via ORAL
  Filled 2014-02-18: qty 1

## 2014-02-18 MED ORDER — NICOTINE 21 MG/24HR TD PT24: 21.0000 mg | MEDICATED_PATCH | Freq: Every day | TRANSDERMAL | Status: DC

## 2014-02-18 MED ORDER — HYDROXYZINE HCL 25 MG PO TABS: 25.0000 mg | ORAL_TABLET | Freq: Four times a day (QID) | ORAL | Status: DC | PRN

## 2014-02-18 MED ORDER — ARIPIPRAZOLE 5 MG PO TABS
5.0000 mg | ORAL_TABLET | Freq: Every day | ORAL | Status: DC
  Administered 2014-02-18: 5 mg via ORAL
  Filled 2014-02-18: qty 1

## 2014-02-18 MED ORDER — TRAZODONE HCL 50 MG PO TABS: 50.0000 mg | ORAL_TABLET | Freq: Every evening | ORAL | Status: DC | PRN

## 2014-02-18 MED ORDER — LAMOTRIGINE 25 MG PO TABS
25.0000 mg | ORAL_TABLET | Freq: Every day | ORAL | Status: DC
  Administered 2014-02-18: 25 mg via ORAL
  Filled 2014-02-18: qty 1

## 2014-02-18 MED ORDER — IBUPROFEN 200 MG PO TABS: 600.0000 mg | ORAL_TABLET | Freq: Three times a day (TID) | ORAL | Status: DC | PRN

## 2014-02-18 MED ORDER — ZOLPIDEM TARTRATE 5 MG PO TABS: 5.0000 mg | ORAL_TABLET | Freq: Every evening | ORAL | Status: DC | PRN

## 2014-02-18 NOTE — ED Notes (Addendum)
Pt has in belonging bag:  Pink shirt, black shorts, pink and white charger, blue iphone

## 2014-02-18 NOTE — [facility] Suicide Risk Assessment (Signed)
Suicide Risk Assessment  Discharge Assessment     Demographic Factors:  Adolescent or young adult and Caucasian  Total Time spent with patient: 45 minutes  Psychiatric Specialty Exam:     Blood pressure 105/57, pulse 78, temperature 98 F (36.7 C), temperature source Oral, resp. rate 18, last menstrual period 01/11/2014, SpO2 100.00%.There is no weight on file to calculate BMI.  General Appearance: Casual  Eye Contact::  Good  Speech:  Clear and Coherent  Volume:  Normal  Mood:  Euthymic  Affect:  Appropriate  Thought Process:  Coherent and Logical  Orientation:  Full (Time, Place, and Person)  Thought Content:  Negative  Suicidal Thoughts:  No  Homicidal Thoughts:  No  Memory:  Immediate;   Good Recent;   Good Remote;   Good  Judgement:  Good  Insight:  Good  Psychomotor Activity:  Normal  Concentration:  Good  Recall:  Good  Fund of Knowledge:Good  Language: Good  Akathisia:  Negative  Handed:  Right  AIMS (if indicated):     Assets:  Communication Skills Desire for Improvement Financial Resources/Insurance Housing Leisure Time Physical Health Resilience Social Support Talents/Skills Transportation Vocational/Educational  Sleep:       Musculoskeletal: Strength & Muscle Tone: within normal limits Gait & Station: normal Patient leans: N/A   Mental Status Per Nursing Assessment::   On Admission:     Current Mental Status by Physician: NA  Loss Factors: NA  Historical Factors: NA  Risk Reduction Factors:   NA  Continued Clinical Symptoms:  Bipolar Disorder:   Depressive phase  Cognitive Features That Contribute To Risk:  none    Suicide Risk:  Minimal: No identifiable suicidal ideation.  Patients presenting with no risk factors but with morbid ruminations; may be classified as minimal risk based on the severity of the depressive symptoms  Discharge Diagnoses:   AXIS I:  Adjustment Disorder with Depressed Mood AXIS II:  Deferred AXIS  III:   Past Medical History  Diagnosis Date  . Scoliosis   . MRSA (methicillin resistant staph aureus) culture positive   . Bipolar affective   . Depression   . Gonorrhea    AXIS IV:  other psychosocial or environmental problems AXIS V:  61-70 mild symptoms  Plan Of Care/Follow-up recommendations:  Activity:  resume usual activity Diet:  resume usual diet  Is patient on multiple antipsychotic therapies at discharge:  No   Has Patient had three or more failed trials of antipsychotic monotherapy by history:  No  Recommended Plan for Multiple Antipsychotic Therapies: NA    TAYLOR,GERALD D 02/18/2014, 11:18 AM

## 2014-02-18 NOTE — ED Provider Notes (Signed)
Medical screening examination/treatment/procedure(s) were performed by non-physician practitioner and as supervising physician I was immediately available for consultation/collaboration.   EKG Interpretation None        Enid SkeensJoshua M Eugune Sine, MD 02/18/14 [phone]

## 2014-02-18 NOTE — Consult Note (Signed)
Review of Systems   Constitutional: Negative.    HENT: Negative.    Eyes: Negative.    Respiratory: Negative.    Cardiovascular: Negative.    Gastrointestinal: Negative.    Genitourinary: Negative.    Musculoskeletal: Negative.    Skin: Negative.    Neurological: Negative.    Endo/Heme/Allergies: Negative.    Psychiatric/Behavioral: Negative.

## 2014-02-18 NOTE — Consult Note (Signed)
[facility] Consult   Reason for Consult:  Over heard making a suicidal threat Referring Physician:  ER MD   Karen Kim is an 18 y.o. female. Total Time spent with patient: 45 minutes  Assessment: AXIS I:  Adjustment Disorder with Depressed Mood AXIS II:  Deferred AXIS III:   Past Medical History  Diagnosis Date  . Scoliosis   . MRSA (methicillin resistant staph aureus) culture positive   . Bipolar affective   . Depression   . Gonorrhea    AXIS IV:  other psychosocial or environmental problems AXIS V:  61-70 mild symptoms  Plan:  No evidence of imminent risk to self or others at present.    Subjective:   Karen Kim is a 18 y.o. female patient admitted with overheard making a suicidal threat.  HPI:  Ms Gallogly says she did say she was feeling suicidal in the heat of the moment while breaking up with her boyfriend.  She says they have broken up many times before as he always breaks up whenever they have a disagreement.  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  Says she can live without the boyfriend but will give him another chance if he wants one.  She and he mother have a good relationship. HPI Elements:   Location:  situational depression. Quality:  no suicidal thoughts today. Severity:  no suicidal thoughts today. Timing:  argument with her boyfriend and a breakup with him. Duration:  one day. Context:  as above.  Past Psychiatric History: Past Medical History  Diagnosis Date  . Scoliosis   . MRSA (methicillin resistant staph aureus) culture positive   . Bipolar affective   . Depression   . Gonorrhea     reports that she has been passively smoking.  She has never used smokeless tobacco. She reports that she does not drink alcohol or use illicit drugs. Family History  Problem Relation Age of Onset  . Suicidality Father     Committed suicide           Allergies:  No  Known Allergies  ACT Assessment Complete:  Yes:    Educational Status    Risk to Self: Risk to self with the past 6 months Is patient at risk for suicide?: Yes Substance abuse history and/or treatment for substance abuse?: Yes ([facility])  Risk to Others:    Abuse:    Prior Inpatient Therapy:    Prior Outpatient Therapy:    Additional Information:                    Objective: Blood pressure 105/57, pulse 78, temperature 98 F (36.7 C), temperature source Oral, resp. rate 18, last menstrual period 01/11/2014, SpO2 100.00%.There is no weight on file to calculate BMI. Results for orders placed during the hospital encounter of 02/18/14 (from the past 72 hour(s))  ACETAMINOPHEN LEVEL     Status: None   Collection Time    02/18/14  2:18 AM      Result Value Ref Range   Acetaminophen (Tylenol), Serum <15.0  10 - 30 ug/mL   Comment:            THERAPEUTIC CONCENTRATIONS VARY     SIGNIFICANTLY. A RANGE OF 10-30     ug/mL MAY BE AN EFFECTIVE     CONCENTRATION FOR MANY PATIENTS.     HOWEVER, SOME ARE BEST TREATED  AT CONCENTRATIONS OUTSIDE THIS     RANGE.     ACETAMINOPHEN CONCENTRATIONS     >150 ug/mL AT 4 HOURS AFTER     INGESTION AND >50 ug/mL AT 12     HOURS AFTER INGESTION ARE     OFTEN ASSOCIATED WITH TOXIC     REACTIONS.  CBC     Status: None   Collection Time    02/18/14  2:18 AM      Result Value Ref Range   WBC 9.9  4.0 - 10.5 K/uL   RBC 4.57  3.87 - 5.11 MIL/uL   Hemoglobin 13.7  12.0 - 15.0 g/dL   HCT 40.7  36.0 - 46.0 %   MCV 89.1  78.0 - 100.0 fL   MCH 30.0  26.0 - 34.0 pg   MCHC 33.7  30.0 - 36.0 g/dL   RDW 13.8  11.5 - 15.5 %   Platelets 279  150 - 400 K/uL  COMPREHENSIVE METABOLIC PANEL     Status: Abnormal   Collection Time    02/18/14  2:18 AM      Result Value Ref Range   Sodium 141  137 - 147 mEq/L   Potassium 3.8  3.7 - 5.3 mEq/L   Chloride 103  96 - 112 mEq/L   CO2 25  19 - 32 mEq/L   Glucose, Bld 112 (*) 70 - 99 mg/dL   BUN 13  6 -  23 mg/dL   Creatinine, Ser 0.58  0.50 - 1.10 mg/dL   Calcium 9.8  8.4 - 10.5 mg/dL   Total Protein 8.2  6.0 - 8.3 g/dL   Albumin 4.1  3.5 - 5.2 g/dL   AST 13  0 - 37 U/L   ALT 10  0 - 35 U/L   Alkaline Phosphatase 66  39 - 117 U/L   Total Bilirubin 0.9  0.3 - 1.2 mg/dL   GFR calc non Af Amer >90  >90 mL/min   GFR calc Af Amer >90  >90 mL/min   Comment: (NOTE)     The eGFR has been calculated using the CKD EPI equation.     This calculation has not been validated in all clinical situations.     eGFR's persistently <90 mL/min signify possible Chronic Kidney     Disease.   Anion gap 13  5 - 15  ETHANOL     Status: None   Collection Time    02/18/14  2:18 AM      Result Value Ref Range   Alcohol, Ethyl (B) <11  0 - 11 mg/dL   Comment:            LOWEST DETECTABLE LIMIT FOR     SERUM ALCOHOL IS 11 mg/dL     FOR MEDICAL PURPOSES ONLY  SALICYLATE LEVEL     Status: Abnormal   Collection Time    02/18/14  2:18 AM      Result Value Ref Range   Salicylate Lvl <4.9 (*) 2.8 - 20.0 mg/dL  URINE RAPID DRUG SCREEN (HOSP PERFORMED)     Status: Abnormal   Collection Time    02/18/14  3:33 AM      Result Value Ref Range   Opiates NONE DETECTED  NONE DETECTED   Cocaine NONE DETECTED  NONE DETECTED   Benzodiazepines NONE DETECTED  NONE DETECTED   Amphetamines NONE DETECTED  NONE DETECTED   Tetrahydrocannabinol POSITIVE (*) NONE DETECTED   Barbiturates NONE DETECTED  NONE DETECTED  Comment:            DRUG SCREEN FOR MEDICAL PURPOSES     ONLY.  IF CONFIRMATION IS NEEDED     FOR ANY PURPOSE, NOTIFY LAB     WITHIN 5 DAYS.                LOWEST DETECTABLE LIMITS     FOR URINE DRUG SCREEN     Drug Class       Cutoff (ng/mL)     Amphetamine      1000     Barbiturate      200     Benzodiazepine   329     Tricyclics       924     Opiates          300     Cocaine          300     THC              50  PREGNANCY, URINE     Status: None   Collection Time    02/18/14  3:33 AM      Result  Value Ref Range   Preg Test, Ur NEGATIVE  NEGATIVE   Comment:            THE SENSITIVITY OF THIS     METHODOLOGY IS >20 mIU/mL.   Labs are reviewed and are pertinent for [facility].  Current Facility-Administered Medications  Medication Dose Route Frequency Provider Last Rate Last Dose  . acetaminophen (TYLENOL) tablet 650 mg  650 mg Oral Q4H PRN Antonietta Breach, PA-C      . alum & mag hydroxide-simeth (MAALOX/MYLANTA) 200-200-20 MG/5ML suspension 30 mL  30 mL Oral PRN Antonietta Breach, PA-C      . ARIPiprazole (ABILIFY) tablet 5 mg  5 mg Oral Daily Antonietta Breach, PA-C   5 mg at 02/18/14 1032  . hydrOXYzine (ATARAX/VISTARIL) tablet 25 mg  25 mg Oral Q6H PRN Antonietta Breach, PA-C      . ibuprofen (ADVIL,MOTRIN) tablet 600 mg  600 mg Oral Q8H PRN Antonietta Breach, PA-C      . lamoTRIgine (LAMICTAL) tablet 25 mg  25 mg Oral Daily Antonietta Breach, PA-C   25 mg at 02/18/14 1032  . LORazepam (ATIVAN) tablet 1 mg  1 mg Oral Q8H PRN Antonietta Breach, PA-C   1 mg at 02/18/14 0342  . nicotine (NICODERM CQ - dosed in mg/24 hours) patch 21 mg  21 mg Transdermal Daily Antonietta Breach, PA-C      . ondansetron [facility]) tablet 4 mg  4 mg Oral Q8H PRN Antonietta Breach, PA-C      . traZODone (DESYREL) tablet 50 mg  50 mg Oral QHS,MR X 1 Antonietta Breach, PA-C      . zolpidem (AMBIEN) tablet 5 mg  5 mg Oral QHS PRN Antonietta Breach, PA-C       Current Outpatient Prescriptions  Medication Sig Dispense Refill  . ARIPiprazole (ABILIFY) 5 MG tablet Take 1 tablet (5 mg total) by mouth daily.  30 tablet  0  . lamoTRIgine (LAMICTAL) 25 MG tablet Take 1 tablet (25 mg total) by mouth daily.  30 tablet  0    Psychiatric Specialty Exam:     Blood pressure 105/57, pulse 78, temperature 98 F (36.7 C), temperature source Oral, resp. rate 18, last menstrual period 01/11/2014, SpO2 100.00%.There is no weight on file to calculate BMI.  General Appearance: Casual  Eye Contact::  Good  Speech:  Clear and Coherent  Volume:  Normal  Mood:  Euthymic  Affect:  Appropriate   Thought Process:  Coherent and Logical  Orientation:  Full (Time, Place, and Person)  Thought Content:  Negative  Suicidal Thoughts:  No  Homicidal Thoughts:  No  Memory:  Immediate;   Good Recent;   Good Remote;   Good  Judgement:  Good  Insight:  Good  Psychomotor Activity:  Normal  Concentration:  Good  Recall:  Good  Fund of Knowledge:Good  Language: Good  Akathisia:  Negative  Handed:  Right  AIMS (if indicated):     Assets:  Communication Skills Desire for Improvement Financial Resources/Insurance Housing Leisure Time [place] Talents/Skills Transportation Vocational/Educational  Sleep:      Musculoskeletal: Strength & Muscle Tone: within normal limits Gait & Station: normal Patient leans: N/A  Treatment Plan Summary: release from IVC and discharge home today to be followed outpatient  TAYLOR,GERALD D 02/18/2014 11:03 AM

## 2014-02-18 NOTE — ED Notes (Signed)
Per IVC paperwork, pt was threatening suicide to her mom. Pt also has not been taking her psychiatric meds. While talking to pt, she denies any SI/HI and states she last took her meds two days ago. Pt is calm and cooperative.

## 2014-02-18 NOTE — ED Provider Notes (Signed)
CSN: [id number]     Arrival date & time 02/18/14  0212 History   First MD Initiated Contact with Patient 02/18/14 [phone]     Chief Complaint  Patient presents with  . Suicidal  . IVC     (Consider location/radiation/quality/duration/timing/severity/associated sxs/prior Treatment) HPI Comments: Patient is a 18 year old female with a history of bipolar affective disorder, depression, and suicide attempt by overdose who presents to the emergency department under IVC. Patient's mother took out IVC papers because patient was threatening suicide. Patient states that she told her friend that she wanted to kill herself after her boyfriend broke up with her today, but she states that she only said this for attention. Patient denies any suicidal ideations. She states that she has been taking her psychiatric medication, but missed a dose yesterday because she was sleeping over a friend's house. Patient denies alcohol or illicit drug use.  The history is provided by the patient. No language interpreter was used.    Past Medical History  Diagnosis Date  . Scoliosis   . MRSA (methicillin resistant staph aureus) culture positive   . Bipolar affective   . Depression   . Gonorrhea    Past Surgical History  Procedure Laterality Date  . Back surgery  2012    spinal fusion  . Back surgery  2012    MRSA infection- rods removed.  . Back surgery  2013    Rods put back in   Family History  Problem Relation Age of Onset  . Suicidality Father     Committed suicide   History  Substance Use Topics  . Smoking status: Passive Smoke Exposure - Never Smoker  . Smokeless tobacco: Never Used  . Alcohol Use: No   OB History   Grav Para Term Preterm Abortions TAB SAB Ect Mult Living                  Review of Systems  Psychiatric/Behavioral: Positive for behavioral problems. Negative for suicidal ideas.  All other systems reviewed and are negative.    Allergies  Review of patient's allergies  indicates no known allergies.  Home Medications   Prior to Admission medications   Medication Sig Start Date End Date Taking? Authorizing Provider  ARIPiprazole (ABILIFY) 5 MG tablet Take 1 tablet (5 mg total) by mouth daily. 01/22/14  Yes Nanine Means, NP  lamoTRIgine (LAMICTAL) 25 MG tablet Take 1 tablet (25 mg total) by mouth daily. 01/22/14  Yes Nanine Means, NP   BP 105/57  Pulse 78  Temp(Src) 98 F (36.7 C) (Oral)  Resp 18  SpO2 100%  LMP 01/11/2014  Physical Exam  Nursing note and vitals reviewed. Constitutional: She is oriented to person, place, and time. She appears well-developed and well-nourished. No distress.  HENT:  Head: Normocephalic and atraumatic.  Eyes: Conjunctivae and EOM are normal. No scleral icterus.  Neck: Normal range of motion.  Pulmonary/Chest: Effort normal. No respiratory distress.  Musculoskeletal: Normal range of motion.  Neurological: She is alert and oriented to person, place, and time.  Skin: Skin is warm and dry. No rash noted. She is not diaphoretic. No erythema. No pallor.  Psychiatric: Her speech is normal and behavior is normal. She exhibits a depressed mood (mild). She expresses no homicidal and no suicidal ideation. She expresses no suicidal plans and no homicidal plans.    ED Course  Procedures (including critical care time) Labs Review Labs Reviewed  COMPREHENSIVE METABOLIC PANEL - Abnormal; Notable for the following:  Glucose, Bld 112 (*)    All other components within normal limits  SALICYLATE LEVEL - Abnormal; Notable for the following:    Salicylate Lvl <2.0 (*)    All other components within normal limits  URINE RAPID DRUG SCREEN (HOSP PERFORMED) - Abnormal; Notable for the following:    Tetrahydrocannabinol POSITIVE (*)    All other components within normal limits  ACETAMINOPHEN LEVEL  CBC  ETHANOL  PREGNANCY, URINE    Imaging Review No results found.   EKG Interpretation None      MDM   Final diagnoses:   Suicidal ideation    18 year old female presents to the emergency department under IVC taken out by her mother. IVC papers reference suicidal ideations. Patient endorses brief suicidal thoughts after her boyfriend broke up with her yesterday. She denies any suicidal ideations at present and states that her mentioning her suicidal thoughts to her friend was to gain attention, only. Labs reviewed and patient medically cleared. Patient currently pending TTS evaluation. Disposition to be determined by oncoming ED provider.   Filed Vitals:   02/18/14 0214 02/18/14 0535  BP: 112/60 105/57  Pulse: 84 78  Temp: 98.3 F (36.8 C) 98 F (36.7 C)  TempSrc: Oral Oral  Resp: 16 18  SpO2: 100% 100%       Antony MaduraKelly Heidee Audi, PA-C 02/18/14 [phone]

## 2014-02-18 NOTE — ED Notes (Signed)
Patient denies SI, HI, AVH. Rates anxiety 5/10, depression 0/10. Reports that she had a breakup with a boyfriend and was upset. Patient states she was overheard making SI comments. States she was just upset in the moment. Someone overheard and reported this to her mother. Patient states that she was not serious about her comments and that she is over the breakup.   Encouragement offered. Ativan given.  Q 15 safety checks in place.

## 2014-03-22 ENCOUNTER — Emergency Department (HOSPITAL_COMMUNITY)
Admission: EM | Admit: 2014-03-22 | Discharge: 2014-03-22 | Disposition: A | Discharge status: Home / Self Care | Payer: Medicaid Other | Attending: Emergency Medicine | Admitting: Emergency Medicine

## 2014-03-22 ENCOUNTER — Encounter (HOSPITAL_COMMUNITY): Payer: Self-pay | Admitting: Emergency Medicine

## 2014-03-22 DIAGNOSIS — Z87891 Personal history of nicotine dependence: Secondary | ICD-10-CM | POA: Insufficient documentation

## 2014-03-22 DIAGNOSIS — L237 Allergic contact dermatitis due to plants, except food: Secondary | ICD-10-CM

## 2014-03-22 DIAGNOSIS — L255 Unspecified contact dermatitis due to plants, except food: Secondary | ICD-10-CM | POA: Insufficient documentation

## 2014-03-22 DIAGNOSIS — R21 Rash and other nonspecific skin eruption: Secondary | ICD-10-CM | POA: Insufficient documentation

## 2014-03-22 DIAGNOSIS — Z8614 Personal history of Methicillin resistant Staphylococcus aureus infection: Secondary | ICD-10-CM | POA: Insufficient documentation

## 2014-03-22 DIAGNOSIS — J069 Acute upper respiratory infection, unspecified: Secondary | ICD-10-CM

## 2014-03-22 DIAGNOSIS — Z8739 Personal history of other diseases of the musculoskeletal system and connective tissue: Secondary | ICD-10-CM | POA: Insufficient documentation

## 2014-03-22 DIAGNOSIS — Z8619 Personal history of other infectious and parasitic diseases: Secondary | ICD-10-CM | POA: Insufficient documentation

## 2014-03-22 DIAGNOSIS — Z8659 Personal history of other mental and behavioral disorders: Secondary | ICD-10-CM | POA: Insufficient documentation

## 2014-03-22 LAB — RAPID STREP SCREEN (MED CTR MEBANE ONLY): Streptococcus, Group A Screen (Direct): NEGATIVE

## 2014-03-22 MED ORDER — PREDNISONE 10 MG PO TABS: 20.0000 mg | ORAL_TABLET | Freq: Every day | ORAL | Status: DC

## 2014-03-22 NOTE — Discharge Instructions (Signed)
Please follow the directions provided.  Be sure to follow up with your primary care provider to ensure this rash is improving.  SEEK MEDICAL CARE IF:  Open sores develop.  Redness spreads beyond area of rash.  You notice purulent (pus-like) discharge.  You have increased pain.  Other signs of infection develop (such as fever).   You appear to have an upper respiratory infection (URI). An upper respiratory tract infection, or cold, is a viral infection of the air passages leading to the lungs. It is contagious and can be spread to others, especially during the first 3 or 4 days. It cannot be cured by antibiotics or other medicines. RETURN IMMEDIATELY IF you develop shortness of breath, confusion or altered mental status, a new rash, become dizzy, faint, or poorly responsive, or are unable to be cared for at home.    Emergency Department Resource Guide 1) Find a Doctor and Pay Out of Pocket Although you won't have to find out who is covered by your insurance plan, it is a good idea to ask around and get recommendations. You will then need to call the office and see if the doctor you have chosen will accept you as a new patient and what types of options they offer for patients who are self-pay. Some doctors offer discounts or will set up payment plans for their patients who do not have insurance, but you will need to ask so you aren't surprised when you get to your appointment.  2) Contact Your Local Health Department Not all health departments have doctors that can see patients for sick visits, but many do, so it is worth a call to see if yours does. If you don't know where your local health department is, you can check in your phone book. The CDC also has a tool to help you locate your state's health department, and many state websites also have listings of all of their local health departments.  3) Find a Walk-in Clinic If your illness is not likely to be very severe or complicated, you may  want to try a walk in clinic. These are popping up all over the country in pharmacies, drugstores, and shopping centers. They're usually staffed by nurse practitioners or physician assistants that have been trained to treat common illnesses and complaints. They're usually fairly quick and inexpensive. However, if you have serious medical issues or chronic medical problems, these are probably not your best option.  No Primary Care Doctor: - Call Health Connect at  [phone] - they can help you locate a primary care doctor that  accepts your insurance, provides certain services, etc. - Physician Referral Service- [phone]  Chronic Pain Problems: Organization         Address  Phone   Notes  [facility]  [phone] Patients need to be referred by their primary care doctor.   Medication Assistance: Organization         Address  Phone   Notes  [facility] Medication [facility] [address] [place] [place], [place] [address] [phone] --Must be a resident of [facility] -- Must have NO insurance coverage whatsoever (no Medicaid/ Medicare, etc.) -- The pt. MUST have a primary care doctor that directs their care regularly and follows them in the community   [facility]  [phone]   [employer]  [phone]    Agencies that provide inexpensive medical care: Organization         Address  Phone   Notes  [facility]  [phone]   [facility]    [phone]   [facility] [address] [place], [place] [address] [phone]   [facility] [address] [place] [phone]   Planned Parenthood    [phone]   [facility]    [phone]   [facility]  [address], [place] Phone:  [phone], Fax:  [phone] Hours of Operation:  9 am - 6 pm, M-F.  Also accepts Medicaid/Medicare and self-pay.   [facility]  [address], [place] Phone: [phone], Fax: [phone]. Hours of Operation:  8:30 am - 5:30 pm, M-F.  Also accepts Medicaid and self-pay.  [facility] [place] [address] [place] Phone: [phone]   [facility] [address] Natasha Bence [place], [place] [phone], Ext. [phone] Mondays & Thursdays: 7-9 AM.  First 15 patients are seen on a first come, first serve basis.    Medicaid-accepting [facility] Providers:  Organization         Address  Phone   Notes  [facility] [address], [place] [phone] Also accepts self-pay patients.  [facility] [address] Laurell Josephs [place], [place]  [phone]   [facility] [address], [place] [phone]   [facility] [address], [place] [phone]   Renaye Rakers [address], [place]   [phone] Only accepts [place] Access [place] patients after they have their name applied to their card.   Self-Pay (no insurance) in [facility]:  Organization         Address  Phone   Notes  Sickle Cell Patients, [facility] [address] [place], [place] [phone]   [facility] [address] [place], [place] [phone]   [facility]  [address], [place] [phone]   [facility]/Dr. Osei-Bonsu  [address], [place] or [address], [place] [phone] Phone number for both [place] and [place] locations is the same.  [facility] [address] [place] [phone]   [facility] [address] [place] or [address] [phone]   [facility] [address], [place] [phone], phone; [phone], fax Sees patients 1st and 3rd Saturday of every month.  Must not qualify for public or private insurance (i.e. Medicaid, Medicare, [place] Health Choice, Veterans' Benefits)  Household income should be no more than 200% of the poverty level The clinic cannot treat you if you are pregnant or think you are pregnant  Sexually transmitted diseases are not treated at the clinic.    Dental Care: Organization         Address  Phone  Notes  [facility] Department of [facility] [address] [place], [place] [phone] Accepts children up to age 65 who are enrolled in [place] or [place] Health Choice; pregnant women with a Medicaid card; and children who have applied for Medicaid or [place] Health Choice, but were declined, whose parents can pay a reduced fee at time of service.  [facility] [place]  [address], [place] [phone]) [phone] Accepts children up to age 19 who are enrolled in [place] or [place] Health Choice; pregnant women with a Medicaid card; and children who have applied for Medicaid or [place] Health Choice, but were declined, whose parents can pay a reduced fee at time of service.  [facility]  [address], [place] [phone] Patients are seen by appointment only. Walk-ins are not accepted. [facility] will see patients 19 years of age and older. Monday - Tuesday (8am-5pm) Most Wednesdays (8:30-5pm) $30 per visit, cash only  [facility]  [address], [facility] [place] [phone] Patients are seen by appointment only. Walk-ins are not accepted. [facility] will see patients 19 years of age and older. One Wednesday Evening (Monthly: Volunteer Based).  $30 per visit, cash only  [employer] [facility]  [phone]) [phone] for adults; Children under age 234, call Graduate Pediatric Dentistry at [phone]. Children aged 274-14, please call [phone]  [phone] to request a pediatric application.  Dental services are provided in all areas of dental care including fillings, crowns and bridges, complete and partial dentures, implants, gum treatment, root canals, and extractions. Preventive care is also provided. Treatment is provided to both adults and children. Patients are selected via a lottery and there is often a waiting list.   [facility] [address], [place]  [phone] [email or web address]   [facility] [address], [place], [place] [phone], Ext. [phone] Second and Fourth Thursday of each month, opens at 6:30 AM; Clinic ends at 9 AM.  Patients are seen on a first-come first-served basis, and a limited number are seen during each clinic.   [facility]  [address], [place], [place] [phone]) [phone]   Eligibility Requirements You must have lived in [place], [place], or [place] counties for at least the last three months.   You cannot be eligible for state or federal sponsored National Cityhealthcare insurance, including CIGNAVeterans Administration, [place]icaid, or [employer]icare.   You generally cannot be eligible for healthcare insurance through your employer.    How to apply: Eligibility screenings are held every Tuesday and Wednesday afternoon from 1:00 pm until 4:00 pm. You do not need an appointment for the interview!  [facility] [address], [place], [place] [phone]   [facility]  [phone]   [facility]  [phone]   [facility]  [phone]    Behavioral Health Resources in the Community: Intensive Outpatient Programs Organization         Address  Phone  Notes  [facility] [address], [place], [place] [phone]   [facility] [address], [place], [place] [phone]   [facility] [address], [place], [place]  [phone]    [facility] [address],  [place], [place] [phone] or [phone]   Substance Abuse Resources Organization         Address  Phone  Notes  [facility]  [phone]   [facility]  [phone]   [facility]  [phone]   [facility]  [phone]   Residential & Outpatient Substance Abuse Program  [phone]   Psychological Services Organization         Address  Phone  Notes  [facility] Health  [phone]   [facility] Services  [phone]   [facility] [address], [place] [phone] or [phone]    Mobile Crisis Teams Organization         Address  Phone  Notes  [facility], Mobile Crisis Care Unit  [phone]   [facility]  [address] [place], [place] [phone]   Doristine Locks [address] [place] [phone]    Self-Help/Support Groups Organization         Address  Phone             Notes  Mental Health Assoc. of  - variety of support groups  336- I7437963 Call for more information  Narcotics Anonymous (NA), [facility] [address], [employer] [place]  2 meetings at this location   Statistician         Address  Phone  Notes  [facility] [address],    [place]  [phone]   [facility]  [address] [place] [address], [place], [place] [phone]   [facility] [address] [place], [place] [phone] Admissions: 8am-3pm M-F  [facility] [address]    [place], [place] [phone]   [facility] [address] [place], [place], [place] [phone]   [facility] [address]  [place], [place] [phone]   [facility] - Intensive Outpatient [address] Joseph[address], [place], [place] [phone]   [facility] ([facility] [address] [place]  [place], [place] [phone] or [phone]   [facility] ([facility]) [address] [place], [place] [phone] Accepts Medicaid  [facility] [address],  [place] [phone] Substance Abuse/Addiction Treatment   [facility] Organization         Address  Phone  Notes  [facility]  [phone]   Angie Fava, PhD [address] Ervin Knack [place], [place]   [phone] or [phone]   [facility] Behavioral   [address] [place], [place] [phone]   [facility] [address] [place], [place] [phone] Insurance/Medicaid/sponsorship through [facility] [address]                                    [place], [place] [phone] Therapy/tele-psych/case  [facility] [address], [place] [phone]    Dr. Lolly Mustache  [phone]   [facility]  [facility] Dept. 1) [address], [place] 2) [address] [place] 3)  [address], [place] [phone]  [phone]   [facility] Child Abuse Hotline [phone] or [phone] (After Hours)

## 2014-03-22 NOTE — ED Notes (Signed)
Patient states she has been having a dry non- productive cough x 1 month. Patient also c/o rash "all over" x 3 weeks.

## 2014-03-22 NOTE — ED Provider Notes (Signed)
CSN: [id number]     Arrival date & time 03/22/14  1619 History  This chart was scribed for Harle Battiest, PA, working with Doug Sou, MD found by [name], ED Scribe. This patient was seen in room WTR8/WTR8 and the patient's care was started at 6:03 PM.   Chief Complaint  Patient presents with  . Cough  . Rash   Patient is a 18 y.o. female presenting with rash. The history is provided by the patient. No language interpreter was used.  Rash Associated symptoms: no abdominal pain, no fever, no nausea, no shortness of breath, no sore throat and not vomiting    HPI Comments: Karen Kim is a 18 y.o. female who presents to the Emergency Department complaining of a rash onset 2 weeks ago.  Patient reports she believes she was in contact with poison ivy after walking in the woods 3 weeks ago.  Patient reports she first developed a small rash on her left arm 1 week after being in the woods that began to spread to both her arms and subsequently legs.  Patient reports acute worsening three days ago during which time the rash spread to her face.  Patient denies having a rash on her back and abdomen.  Patient states the rash is "itchy and burns".  Patient reports she she has been homeless for the past month and states she was exposed to flees 1 month ago.  She attributes the rash on her left foot to flea bites and denies that this area itches like the rest of her rash.  Patient denies fever, nausea, vomiting.    Patient also complains of unchanged, intermittent cough and rhinorrhea onset 1 month ago with associated chills and painful swallowing.  Patient is tolerating food and fluids well.  Patient has a history of spinal fusion for scoliosis and kyphosis two years ago.  Patient denies sore throat, abdominal pain, dysuria, vaginal discharge, SOB, ear pain.    Past Medical History  Diagnosis Date  . Scoliosis   . MRSA (methicillin resistant staph aureus) culture positive   . Bipolar  affective   . Depression   . Gonorrhea    Past Surgical History  Procedure Laterality Date  . Back surgery  2012    spinal fusion  . Back surgery  2012    MRSA infection- rods removed.  . Back surgery  2013    Rods put back in   Family History  Problem Relation Age of Onset  . Suicidality Father     Committed suicide   History  Substance Use Topics  . Smoking status: Former Games developer  . Smokeless tobacco: Never Used  . Alcohol Use: Yes     Comment: occasionally   OB History   Grav Para Term Preterm Abortions TAB SAB Ect Mult Living                 Review of Systems  Constitutional: Positive for chills. Negative for fever.  HENT: Positive for rhinorrhea. Negative for ear pain and sore throat.   Respiratory: Positive for cough. Negative for shortness of breath.   Gastrointestinal: Negative for nausea, vomiting and abdominal pain.  Genitourinary: Negative for dysuria and vaginal discharge.  Skin: Positive for rash.    Allergies  Review of patient's allergies indicates no known allergies.  Home Medications   Prior to Admission medications   Not on File   BP 115/67  Pulse 95  Temp(Src) 97.8 F (36.6 C) (Oral)  Resp 16  SpO2 97%  LMP 03/22/2014 Physical Exam  Nursing note and vitals reviewed. Constitutional: She is oriented to person, place, and time. She appears well-developed and well-nourished. No distress.  HENT:  Head: Normocephalic and atraumatic.  Mouth/Throat: No oropharyngeal exudate.  Small amount of erythema.  Enlarged tonsils.  No frontal or maxillary sinus tenderness.  No adenopathy.  Eyes: Conjunctivae and EOM are normal.  Neck: Neck supple. No tracheal deviation present.  Cardiovascular: Normal rate, regular rhythm and normal heart sounds.  Exam reveals no gallop and no friction rub.   No murmur heard. S1/S2 no murmurs rubs or gallops.  Pulmonary/Chest: Effort normal. No respiratory distress.  Musculoskeletal: Normal range of motion.   Neurological: She is alert and oriented to person, place, and time.  Skin: Skin is warm and dry. Rash noted.  Maculopapular rash on bilateral arms, across back between shoulder blades, posterior belt-line, lower abdomen, bilateral dorsal aspect of bilateral feet, face, behind ears.  No drainage.   Psychiatric: She has a normal mood and affect. Her behavior is normal.    ED Course  Procedures (including critical care time)  DIAGNOSTIC STUDIES: Oxygen Saturation is 97% on RA, adequate by my interpretation.    COORDINATION OF CARE:  6:20 PM Informed patient of plan to obtain culture.  Will order labs.  Patient acknowledges and agrees with plan.    Labs Review Labs Reviewed  RAPID STREP SCREEN  CULTURE, GROUP A STREP   Imaging Review No results found.   EKG Interpretation None      MDM   Final diagnoses:  Contact dermatitis due to poison ivy  URI (upper respiratory infection)    18 yo female presents with rash consistent with poison ivy infection. No genital involvement. Airway intact without compromise. Discussed contagiousness & symptomatic management.  DIscharge instructions include script for 14 days of prednisone taper, resources for establishing care and instructions to follow-up with PCP and return precautions. Pt afebrile and in NAD prior to dc.    I personally performed the services described in this documentation, which was scribed in my presence. The recorded information has been reviewed and is accurate.  Filed Vitals:   03/22/14 1626 03/22/14 1946  BP: 115/67 119/66  Pulse: 95 95  Temp: 97.8 F (36.6 C)   TempSrc: Oral   Resp: 16 16  SpO2: 97% 100%   Meds given in ED:  Medications - No data to display  Discharge Medication List as of 03/22/2014  7:41 PM    START taking these medications   Details  predniSONE (DELTASONE) 10 MG tablet Take 60 mg by mouth x 7 days, then 40 mg x 3 days, then  x 2 days, then 10 mg x 2 days., Starting 03/22/2014, Until  Discontinued, Print          Harle Battiest, NP 03/23/14 [phone]

## 2014-03-22 NOTE — ED Notes (Signed)
Pt is leaving with ALL belongings she arrived with.

## 2014-03-22 NOTE — ED Notes (Signed)
Pt c/o rash all over body mainly to face x 3 weeks, in which her mother reports is poison ivy. She has been living outside and from "house to house" per her mother. She also has a nonproductive cough x 3 weeks. Lung sounds clear. Rash red and itchy.

## 2014-03-22 NOTE — ED Notes (Signed)
Sandwich provided.

## 2014-03-24 LAB — CULTURE, GROUP A STREP

## 2014-03-25 NOTE — ED Provider Notes (Signed)
Medical screening examination/treatment/procedure(s) were performed by non-physician practitioner and as supervising physician I was immediately available for consultation/collaboration.   EKG Interpretation None       Rhesa Forsberg, MD 03/25/14 1501 

## 2014-04-08 ENCOUNTER — Encounter (HOSPITAL_COMMUNITY): Payer: Self-pay | Admitting: Emergency Medicine

## 2014-04-08 DIAGNOSIS — N390 Urinary tract infection, site not specified: Secondary | ICD-10-CM | POA: Insufficient documentation

## 2014-04-08 DIAGNOSIS — R5383 Other fatigue: Secondary | ICD-10-CM | POA: Insufficient documentation

## 2014-04-08 DIAGNOSIS — Z8614 Personal history of Methicillin resistant Staphylococcus aureus infection: Secondary | ICD-10-CM | POA: Insufficient documentation

## 2014-04-08 DIAGNOSIS — Z8619 Personal history of other infectious and parasitic diseases: Secondary | ICD-10-CM | POA: Insufficient documentation

## 2014-04-08 DIAGNOSIS — Z8659 Personal history of other mental and behavioral disorders: Secondary | ICD-10-CM | POA: Insufficient documentation

## 2014-04-08 DIAGNOSIS — Z87891 Personal history of nicotine dependence: Secondary | ICD-10-CM | POA: Insufficient documentation

## 2014-04-08 DIAGNOSIS — Z7952 Long term (current) use of systemic steroids: Secondary | ICD-10-CM | POA: Insufficient documentation

## 2014-04-08 DIAGNOSIS — Z3202 Encounter for pregnancy test, result negative: Secondary | ICD-10-CM | POA: Insufficient documentation

## 2014-04-08 DIAGNOSIS — M7981 Nontraumatic hematoma of soft tissue: Secondary | ICD-10-CM | POA: Insufficient documentation

## 2014-04-08 DIAGNOSIS — R05 Cough: Secondary | ICD-10-CM | POA: Insufficient documentation

## 2014-04-08 DIAGNOSIS — L659 Nonscarring hair loss, unspecified: Secondary | ICD-10-CM | POA: Insufficient documentation

## 2014-04-08 LAB — COMPREHENSIVE METABOLIC PANEL
ALBUMIN: 3.4 g/dL — AB (ref 3.5–5.2)
ALK PHOS: 61 U/L (ref 39–117)
ALT: 10 U/L (ref 0–35)
AST: 11 U/L (ref 0–37)
Anion gap: 11 (ref 5–15)
BUN: 13 mg/dL (ref 6–23)
CHLORIDE: 101 meq/L (ref 96–112)
CO2: 27 mEq/L (ref 19–32)
CREATININE: 0.58 mg/dL (ref 0.50–1.10)
Calcium: 9.2 mg/dL (ref 8.4–10.5)
GFR calc Af Amer: >90 mL/min (ref >90)
GFR calc non Af Amer: >90 mL/min (ref >90)
Glucose, Bld: 78 mg/dL (ref 70–99)
POTASSIUM: 4.1 meq/L (ref 3.7–5.3)
SODIUM: 139 meq/L (ref 137–147)
Total Bilirubin: 0.3 mg/dL (ref 0.3–1.2)
Total Protein: 7.8 g/dL (ref 6.0–8.3)

## 2014-04-08 LAB — CBC WITH DIFFERENTIAL/PLATELET
BASOS ABS: 0 10*3/uL (ref 0.0–0.1)
BASOS PCT: 0 % (ref 0–1)
Eosinophils Absolute: 0 10*3/uL (ref 0.0–0.7)
Eosinophils Relative: 0 % (ref 0–5)
HCT: 36.4 % (ref 36.0–46.0)
Hemoglobin: 12.1 g/dL (ref 12.0–15.0)
Lymphocytes Relative: 15 % (ref 12–46)
Lymphs Abs: 1.6 10*3/uL (ref 0.7–4.0)
MCH: 29.4 pg (ref 26.0–34.0)
MCHC: 33.2 g/dL (ref 30.0–36.0)
MCV: 88.3 fL (ref 78.0–100.0)
Monocytes Absolute: 0.7 10*3/uL (ref 0.1–1.0)
Monocytes Relative: 7 % (ref 3–12)
NEUTROS ABS: 8.4 10*3/uL — AB (ref 1.7–7.7)
NEUTROS PCT: 78 % — AB (ref 43–77)
Platelets: 245 10*3/uL (ref 150–400)
RBC: 4.12 MIL/uL (ref 3.87–5.11)
RDW: 14 % (ref 11.5–15.5)
WBC: 10.9 10*3/uL — AB (ref 4.0–10.5)

## 2014-04-08 LAB — URINALYSIS, ROUTINE W REFLEX MICROSCOPIC
Bilirubin Urine: NEGATIVE
GLUCOSE, UA: NEGATIVE mg/dL
Ketones, ur: 15 mg/dL — AB
Nitrite: POSITIVE — AB
Protein, ur: NEGATIVE mg/dL
SPECIFIC GRAVITY, URINE: 1.033 — AB (ref 1.005–1.030)
UROBILINOGEN UA: 1 mg/dL (ref 0.0–1.0)
pH: 6 (ref 5.0–8.0)

## 2014-04-08 LAB — URINE MICROSCOPIC-ADD ON

## 2014-04-08 LAB — PREGNANCY, URINE: PREG TEST UR: NEGATIVE

## 2014-04-08 NOTE — ED Notes (Signed)
Patient arrives with complaint of bilateral flank pain which has been present for 2 days. States that currently it feels sharp and started suddenly. Endorses some radiation to abdomen. Denies urinary symptoms, fever. Additionally states history of cough for 2 months, excessive hair loss, fatigue, weakness, and malaise.

## 2014-04-09 ENCOUNTER — Emergency Department (HOSPITAL_COMMUNITY)
Admission: EM | Admit: 2014-04-09 | Discharge: 2014-04-09 | Disposition: A | Discharge status: Home / Self Care | Payer: Self-pay | Attending: Emergency Medicine | Admitting: Emergency Medicine

## 2014-04-09 ENCOUNTER — Emergency Department (HOSPITAL_COMMUNITY): Payer: Medicaid Other

## 2014-04-09 DIAGNOSIS — N390 Urinary tract infection, site not specified: Secondary | ICD-10-CM

## 2014-04-09 DIAGNOSIS — R05 Cough: Secondary | ICD-10-CM

## 2014-04-09 DIAGNOSIS — R059 Cough, unspecified: Secondary | ICD-10-CM

## 2014-04-09 LAB — TSH: TSH: 3.36 u[IU]/mL (ref 0.350–4.500)

## 2014-04-09 MED ORDER — CIPROFLOXACIN HCL 500 MG PO TABS
500.0000 mg | ORAL_TABLET | Freq: Two times a day (BID) | ORAL | Status: DC
Start: 2014-04-09 — End: 2014-05-26

## 2014-04-09 MED ORDER — NAPROXEN 500 MG PO TABS: 500.0000 mg | ORAL_TABLET | Freq: Two times a day (BID) | ORAL | Status: DC

## 2014-04-09 MED ORDER — CIPROFLOXACIN HCL 500 MG PO TABS
500.0000 mg | ORAL_TABLET | Freq: Once | ORAL | Status: AC
  Administered 2014-04-09: 500 mg via ORAL
  Filled 2014-04-09: qty 1

## 2014-04-09 MED ORDER — NAPROXEN 250 MG PO TABS
500.0000 mg | ORAL_TABLET | Freq: Once | ORAL | Status: AC
  Administered 2014-04-09: 500 mg via ORAL
  Filled 2014-04-09: qty 2

## 2014-04-09 NOTE — ED Provider Notes (Signed)
CSN: [id number]     Arrival date & time 04/08/14  2056 History   First MD Initiated Contact with Patient 04/09/14 [phone]     Chief Complaint  Patient presents with  . Flank Pain  . Cough     (Consider location/radiation/quality/duration/timing/severity/associated sxs/prior Treatment) HPI 18 yo female presents to the ER from home with complaint of two days of bilateral flank pain, left greater than right, generalized fatigue, hair loss, cough, and bruising for several months.  No fevers, chills, n/v/d.  No dysuria, vaginal discharge.  No currently sexually active. No excessive bleeding during menses or dental procedures.  No primary care doctor.    Past Medical History  Diagnosis Date  . Scoliosis   . MRSA (methicillin resistant staph aureus) culture positive   . Bipolar affective   . Depression   . Gonorrhea    Past Surgical History  Procedure Laterality Date  . Back surgery  2012    spinal fusion  . Back surgery  2012    MRSA infection- rods removed.  . Back surgery  2013    Rods put back in   Family History  Problem Relation Age of Onset  . Suicidality Father     Committed suicide   History  Substance Use Topics  . Smoking status: Former Games developermoker  . Smokeless tobacco: Never Used  . Alcohol Use: Yes     Comment: occasionally   OB History   Grav Para Term Preterm Abortions TAB SAB Ect Mult Living                 Review of Systems  All other systems reviewed and are negative.     Allergies  Review of patient's allergies indicates no known allergies.  Home Medications   Prior to Admission medications   Medication Sig Start Date End Date Taking? Authorizing Provider  ciprofloxacin (CIPRO) 500 MG tablet Take 1 tablet (500 mg total) by mouth 2 (two) times daily. 04/09/14   Olivia Mackielga M [place] Schweppe, MD  naproxen (NAPROSYN) 500 MG tablet Take 1 tablet (500 mg total) by mouth 2 (two) times daily with a meal. 04/09/14   Olivia Mackielga M Felice Deem, MD  predniSONE (DELTASONE) 10 MG tablet Take 2  tablets (20 mg total) by mouth daily. Take 60 mg by mouth x 7 days, then 40 mg x 3 days, then 20mg  x 2 days, then 10 mg x 2 days. 03/22/14   Harle BattiestElizabeth Tysinger, NP   BP 111/83  Pulse 84  Temp(Src) 98.8 F (37.1 C) (Oral)  Resp 16  Ht 5\' 2"  (1.575 m)  Wt 180 lb (81.647 kg)  BMI 32.91 kg/m2  SpO2 98%  LMP 04/08/2014 Physical Exam  Nursing note and vitals reviewed. Constitutional: She is oriented to person, place, and time. She appears well-developed and well-nourished. No distress.  HENT:  Head: Normocephalic and atraumatic.  Right Ear: External ear normal.  Left Ear: External ear normal.  Nose: Nose normal.  Mouth/Throat: Oropharynx is clear and moist.  Eyes: Conjunctivae and EOM are normal. Pupils are equal, round, and reactive to light.  Neck: Normal range of motion. Neck supple. No JVD present. No tracheal deviation present. No thyromegaly present.  Cardiovascular: Normal rate, regular rhythm, normal heart sounds and intact distal pulses.  Exam reveals no gallop and no friction rub.   No murmur heard. Pulmonary/Chest: Effort normal and breath sounds normal. No stridor. No respiratory distress. She has no wheezes. She has no rales. She exhibits no tenderness.  Abdominal: Soft. Bowel  sounds are normal. She exhibits no distension and no mass. There is no tenderness. There is no rebound and no guarding.  +cva tenderness bilaterally  Musculoskeletal: Normal range of motion. She exhibits no edema and no tenderness.  Lymphadenopathy:    She has no cervical adenopathy.  Neurological: She is alert and oriented to person, place, and time. She displays normal reflexes. She exhibits normal muscle tone. Coordination normal.  Skin: Skin is warm and dry. No rash noted. She is not diaphoretic. No erythema. No pallor.  Bruising noted to bilateral lower legs all about same age  Psychiatric: She has a normal mood and affect. Her behavior is normal. Judgment and thought content normal.    ED Course   Procedures (including critical care time) Labs Review Labs Reviewed  URINALYSIS, ROUTINE W REFLEX MICROSCOPIC - Abnormal; Notable for the following:    Color, Urine AMBER (*)    APPearance CLOUDY (*)    Specific Gravity, Urine 1.033 (*)    Hgb urine dipstick MODERATE (*)    Ketones, ur 15 (*)    Nitrite POSITIVE (*)    Leukocytes, UA MODERATE (*)    All other components within normal limits  CBC WITH DIFFERENTIAL - Abnormal; Notable for the following:    WBC 10.9 (*)    Neutrophils Relative % 78 (*)    Neutro Abs 8.4 (*)    All other components within normal limits  COMPREHENSIVE METABOLIC PANEL - Abnormal; Notable for the following:    Albumin 3.4 (*)    All other components within normal limits  URINE MICROSCOPIC-ADD ON - Abnormal; Notable for the following:    Squamous Epithelial / LPF FEW (*)    Bacteria, UA MANY (*)    All other components within normal limits  PREGNANCY, URINE  TSH    Imaging Review Dg Chest 2 View  04/09/2014   CLINICAL DATA:  Relatively chronic dry cough for 2 months. Antral Dr.  Francia GreavesEXAM: CHEST  2 VIEW  COMPARISON:  Chest radiograph performed 08/20/2011  FINDINGS: The lungs are well-aerated and clear. There is no evidence of focal opacification, pleural effusion or pneumothorax.  The heart is normal in size; the mediastinal contour is within normal limits. No acute osseous abnormalities are seen. Thoracolumbar spinal fusion hardware is partially imaged.  IMPRESSION: No acute cardiopulmonary process seen.   Electronically Signed   By: Roanna RaiderJeffery  Chang M.D.   On: 04/09/2014 02:16     EKG Interpretation None      MDM   Final diagnoses:  Urinary tract infection without hematuria, site unspecified  Cough    18 yo female with 2 days of bilateral flank pain.  UTI noted on urine.  Denies sexual intercourse in 3-4 weeks.  No vaginal d/c.  No fevers.  Pt also with ongoing cough, fatigue, hair loss.  TSH normal.  Will tx for UTI, have pt f/u with local  PCM    Olivia Mackielga M Amare Bail, MD 04/09/14 2008

## 2014-04-09 NOTE — Discharge Instructions (Signed)
Drink plenty of fluids.  Take medications as prescribed.  Return to the ER for worsening condition or new concerning symptoms.   Cough, Adult  A cough is a reflex. It helps you clear your throat and airways. A cough can help heal your body. A cough can last 2 or 3 weeks (acute) or may last more than 8 weeks (chronic). Some common causes of a cough can include an infection, allergy, or a cold. HOME CARE  Only take medicine as told by your doctor.  If given, take your medicines (antibiotics) as told. Finish them even if you start to feel better.  Use a cold steam vaporizer or humidifier in your home. This can help loosen thick spit (secretions).  Sleep so you are almost sitting up (semi-upright). Use pillows to do this. This helps reduce coughing.  Rest as needed.  Stop smoking if you smoke. GET HELP RIGHT AWAY IF:  You have yellowish-white fluid (pus) in your thick spit.  Your cough gets worse.  Your medicine does not reduce coughing, and you are losing sleep.  You cough up blood.  You have trouble breathing.  Your pain gets worse and medicine does not help.  You have a fever. MAKE SURE YOU:   Understand these instructions.  Will watch your condition.  Will get help right away if you are not doing well or get worse. Document Released: 03/01/2011 Document Revised: 11/02/2013 Document Reviewed: 03/01/2011 [facility] Patient Information 2015 [place], [place]. This information is not intended to replace advice given to you by your health care provider. Make sure you discuss any questions you have with your health care provider.  Urinary Tract Infection Urinary tract infections (UTIs) can develop anywhere along your urinary tract. Your urinary tract is your body's drainage system for removing wastes and extra water. Your urinary tract includes two kidneys, two ureters, a bladder, and a urethra. Your kidneys are a pair of bean-shaped organs. Each kidney is about the size of your  fist. They are located below your ribs, one on each side of your spine. CAUSES Infections are caused by microbes, which are microscopic organisms, including fungi, viruses, and bacteria. These organisms are so small that they can only be seen through a microscope. Bacteria are the microbes that most commonly cause UTIs. SYMPTOMS  Symptoms of UTIs may vary by age and gender of the patient and by the location of the infection. Symptoms in young women typically include a frequent and intense urge to urinate and a painful, burning feeling in the bladder or urethra during urination. Older women and men are more likely to be tired, shaky, and weak and have muscle aches and abdominal pain. A fever may mean the infection is in your kidneys. Other symptoms of a kidney infection include pain in your back or sides below the ribs, nausea, and vomiting. DIAGNOSIS To diagnose a UTI, your caregiver will ask you about your symptoms. Your caregiver also will ask to provide a urine sample. The urine sample will be tested for bacteria and white blood cells. White blood cells are made by your body to help fight infection. TREATMENT  Typically, UTIs can be treated with medication. Because most UTIs are caused by a bacterial infection, they usually can be treated with the use of antibiotics. The choice of antibiotic and length of treatment depend on your symptoms and the type of bacteria causing your infection. HOME CARE INSTRUCTIONS  If you were prescribed antibiotics, take them exactly as your caregiver instructs you. Hovnan[employer] EnterprisesFinish  the medication even if you feel better after you have only taken some of the medication.  Drink enough water and fluids to keep your urine clear or pale yellow.  Avoid caffeine, tea, and carbonated beverages. They tend to irritate your bladder.  Empty your bladder often. Avoid holding urine for long periods of time.  Empty your bladder before and after sexual intercourse.  After a bowel  movement, women should cleanse from front to back. Use each tissue only once. SEEK MEDICAL CARE IF:   You have back pain.  You develop a fever.  Your symptoms do not begin to resolve within 3 days. SEEK IMMEDIATE MEDICAL CARE IF:   You have severe back pain or lower abdominal pain.  You develop chills.  You have nausea or vomiting.  You have continued burning or discomfort with urination. MAKE SURE YOU:   Understand these instructions.  Will watch your condition.  Will get help right away if you are not doing well or get worse. Document Released: 03/28/2005 Document Revised: 12/18/2011 Document Reviewed: 07/27/2011 [facility]Exit[facility] Patient Information 2015 [place], [place]. This information is not intended to replace advice given to you by your health care provider. Make sure you discuss any questions you have with your health care provider.

## 2014-05-22 ENCOUNTER — Emergency Department (HOSPITAL_COMMUNITY): Payer: Medicaid Other

## 2014-05-22 ENCOUNTER — Emergency Department (HOSPITAL_COMMUNITY): Payer: Self-pay

## 2014-05-22 ENCOUNTER — Encounter (HOSPITAL_COMMUNITY): Payer: Self-pay

## 2014-05-22 ENCOUNTER — Inpatient Hospital Stay (HOSPITAL_COMMUNITY)
Admission: EM | Admit: 2014-05-22 | Discharge: 2014-05-26 | DRG: 917 | Disposition: A | Discharge status: Other Acute Inpatient Hospital | Payer: Self-pay | Attending: Internal Medicine | Admitting: Internal Medicine

## 2014-05-22 DIAGNOSIS — G9341 Metabolic encephalopathy: Secondary | ICD-10-CM | POA: Diagnosis present

## 2014-05-22 DIAGNOSIS — Z9119 Patient's noncompliance with other medical treatment and regimen: Secondary | ICD-10-CM | POA: Diagnosis present

## 2014-05-22 DIAGNOSIS — Z87891 Personal history of nicotine dependence: Secondary | ICD-10-CM

## 2014-05-22 DIAGNOSIS — R51 Headache: Secondary | ICD-10-CM

## 2014-05-22 DIAGNOSIS — T50901A Poisoning by unspecified drugs, medicaments and biological substances, accidental (unintentional), initial encounter: Secondary | ICD-10-CM | POA: Diagnosis present

## 2014-05-22 DIAGNOSIS — E669 Obesity, unspecified: Secondary | ICD-10-CM | POA: Diagnosis present

## 2014-05-22 DIAGNOSIS — R519 Headache, unspecified: Secondary | ICD-10-CM

## 2014-05-22 DIAGNOSIS — Z79899 Other long term (current) drug therapy: Secondary | ICD-10-CM

## 2014-05-22 DIAGNOSIS — Z981 Arthrodesis status: Secondary | ICD-10-CM

## 2014-05-22 DIAGNOSIS — T50904A Poisoning by unspecified drugs, medicaments and biological substances, undetermined, initial encounter: Secondary | ICD-10-CM

## 2014-05-22 DIAGNOSIS — I959 Hypotension, unspecified: Secondary | ICD-10-CM | POA: Diagnosis present

## 2014-05-22 DIAGNOSIS — F332 Major depressive disorder, recurrent severe without psychotic features: Secondary | ICD-10-CM | POA: Diagnosis present

## 2014-05-22 DIAGNOSIS — F3130 Bipolar disorder, current episode depressed, mild or moderate severity, unspecified: Secondary | ICD-10-CM | POA: Diagnosis present

## 2014-05-22 DIAGNOSIS — T426X4A Poisoning by other antiepileptic and sedative-hypnotic drugs, undetermined, initial encounter: Secondary | ICD-10-CM | POA: Diagnosis present

## 2014-05-22 DIAGNOSIS — F319 Bipolar disorder, unspecified: Secondary | ICD-10-CM | POA: Diagnosis present

## 2014-05-22 DIAGNOSIS — I10 Essential (primary) hypertension: Secondary | ICD-10-CM | POA: Diagnosis present

## 2014-05-22 DIAGNOSIS — T43594A Poisoning by other antipsychotics and neuroleptics, undetermined, initial encounter: Principal | ICD-10-CM | POA: Diagnosis present

## 2014-05-22 DIAGNOSIS — R339 Retention of urine, unspecified: Secondary | ICD-10-CM | POA: Diagnosis present

## 2014-05-22 DIAGNOSIS — R4182 Altered mental status, unspecified: Secondary | ICD-10-CM | POA: Diagnosis present

## 2014-05-22 DIAGNOSIS — E876 Hypokalemia: Secondary | ICD-10-CM | POA: Diagnosis present

## 2014-05-22 LAB — URINALYSIS, ROUTINE W REFLEX MICROSCOPIC
Bilirubin Urine: NEGATIVE
Glucose, UA: NEGATIVE mg/dL
Hgb urine dipstick: NEGATIVE
Ketones, ur: NEGATIVE mg/dL
LEUKOCYTES UA: NEGATIVE
NITRITE: NEGATIVE
PH: 7 (ref 5.0–8.0)
Protein, ur: NEGATIVE mg/dL
SPECIFIC GRAVITY, URINE: 1.004 — AB (ref 1.005–1.030)
Urobilinogen, UA: 0.2 mg/dL (ref 0.0–1.0)

## 2014-05-22 LAB — BLOOD GAS, ARTERIAL
Acid-Base Excess: 0.8 mmol/L (ref 0.0–2.0)
BICARBONATE: 23.9 meq/L (ref 20.0–24.0)
Drawn by: 235321
FIO2: 0.21 %
O2 SAT: 91.8 %
PATIENT TEMPERATURE: 98.6
TCO2: 21.2 mmol/L (ref 0–100)
pCO2 arterial: 34.8 mmHg — ABNORMAL LOW (ref 35.0–45.0)
pH, Arterial: 7.451 — ABNORMAL HIGH (ref 7.350–7.450)
pO2, Arterial: 58.1 mmHg — ABNORMAL LOW (ref 80.0–100.0)

## 2014-05-22 LAB — RAPID URINE DRUG SCREEN, HOSP PERFORMED
Amphetamines: NOT DETECTED
Barbiturates: NOT DETECTED
Benzodiazepines: NOT DETECTED
Cocaine: NOT DETECTED
OPIATES: NOT DETECTED
Tetrahydrocannabinol: NOT DETECTED

## 2014-05-22 LAB — CBC WITH DIFFERENTIAL/PLATELET
BASOS PCT: 0 % (ref 0–1)
Basophils Absolute: 0 10*3/uL (ref 0.0–0.1)
Eosinophils Absolute: 0 10*3/uL (ref 0.0–0.7)
Eosinophils Relative: 0 % (ref 0–5)
HCT: 38.5 % (ref 36.0–46.0)
HEMOGLOBIN: 12.8 g/dL (ref 12.0–15.0)
Lymphocytes Relative: 10 % — ABNORMAL LOW (ref 12–46)
Lymphs Abs: 0.4 10*3/uL — ABNORMAL LOW (ref 0.7–4.0)
MCH: 29.8 pg (ref 26.0–34.0)
MCHC: 33.2 g/dL (ref 30.0–36.0)
MCV: 89.5 fL (ref 78.0–100.0)
MONO ABS: 0 10*3/uL — AB (ref 0.1–1.0)
MONOS PCT: 1 % — AB (ref 3–12)
NEUTROS PCT: 89 % — AB (ref 43–77)
Neutro Abs: 3.9 10*3/uL (ref 1.7–7.7)
Platelets: 246 10*3/uL (ref 150–400)
RBC: 4.3 MIL/uL (ref 3.87–5.11)
RDW: 14.2 % (ref 11.5–15.5)
WBC: 4.3 10*3/uL (ref 4.0–10.5)

## 2014-05-22 LAB — COMPREHENSIVE METABOLIC PANEL
ALBUMIN: 4 g/dL (ref 3.5–5.2)
ALT: 9 U/L (ref 0–35)
ANION GAP: 14 (ref 5–15)
AST: 15 U/L (ref 0–37)
Alkaline Phosphatase: 57 U/L (ref 39–117)
BUN: 8 mg/dL (ref 6–23)
CO2: 23 mEq/L (ref 19–32)
CREATININE: 0.6 mg/dL (ref 0.50–1.10)
Calcium: 9.8 mg/dL (ref 8.4–10.5)
Chloride: 100 mEq/L (ref 96–112)
GFR calc Af Amer: >90 mL/min (ref >90)
GFR calc non Af Amer: >90 mL/min (ref >90)
Glucose, Bld: 134 mg/dL — ABNORMAL HIGH (ref 70–99)
POTASSIUM: 4.1 meq/L (ref 3.7–5.3)
Sodium: 137 mEq/L (ref 137–147)
Total Bilirubin: 0.6 mg/dL (ref 0.3–1.2)
Total Protein: 8.1 g/dL (ref 6.0–8.3)

## 2014-05-22 LAB — I-STAT BETA HCG BLOOD, ED (MC, WL, AP ONLY)

## 2014-05-22 LAB — CBG MONITORING, ED: Glucose-Capillary: 129 mg/dL — ABNORMAL HIGH (ref 70–99)

## 2014-05-22 LAB — AMMONIA: AMMONIA: 23 umol/L (ref 11–60)

## 2014-05-22 LAB — I-STAT CG4 LACTIC ACID, ED: Lactic Acid, Venous: 1.08 mmol/L (ref 0.5–2.2)

## 2014-05-22 LAB — SALICYLATE LEVEL

## 2014-05-22 LAB — ACETAMINOPHEN LEVEL: Acetaminophen (Tylenol), Serum: <15 ug/mL (ref 10–30)

## 2014-05-22 MED ORDER — SODIUM CHLORIDE 0.9 % IV BOLUS (SEPSIS)
2000.0000 mL | Freq: Once | INTRAVENOUS | Status: AC
  Administered 2014-05-22: 2000 mL via INTRAVENOUS

## 2014-05-22 MED ORDER — SODIUM CHLORIDE 0.9 % IV BOLUS (SEPSIS): 500.0000 mL | Freq: Once | INTRAVENOUS | Status: DC

## 2014-05-22 NOTE — ED Notes (Signed)
Pt presents with mom with c/o drug overdose. Pt's mother at bedside and reports that she called her daughter for dinner earlier this evening and when he daughter came out of the room she was in and out of consciousness and mom reports she "looked like a zombie". Mom believes that pt has overdosed on medication. Pt is somewhat confused in triage at this time, speaking about bread and playing with the pulse ox on her finger. When asked if she took medication she said that she doesn't know what she took and that she took "2 or 3 of them". When asked again what pt took, she reports that she did not take anything. Pt is not making any sense with her statements at this time.

## 2014-05-22 NOTE — ED Provider Notes (Signed)
CSN: [id number]     Arrival date & time 05/22/14  2102 History   First MD Initiated Contact with Patient 05/22/14 2122     Chief Complaint  Patient presents with  . Drug Overdose     (Consider location/radiation/quality/duration/timing/severity/associated sxs/prior Treatment) HPI 18 year old female with history of bipolar disorder with history of anxiety depression has been depressed for months but is not known to be suicidal recently, was last known well at her baseline at 5:00 this evening when she went into her room at her house and when her family checked on her at 7:00 this evening the patient was found to be confused with dilated pupils and was brought to the emergency department, the patient denies any overdose, the patient denies any threats to hurt herself or others, the patient denies suicidal or homicidal ideation, the patient denies hallucinations, there is no known trauma, there has been no fever, the patient denies any cough chest pain shortness breath abdominal pain vomiting diarrhea or other concerns. The patient's headache onset has been within the last few hours. The patient is currently oriented to person only not to time or to place. Patient apparently told her family she took 2 or 3 unknown pills and family suspects overdose. Past Medical History  Diagnosis Date  . Scoliosis   . MRSA (methicillin resistant staph aureus) culture positive   . Bipolar affective   . Depression   . Gonorrhea    Past Surgical History  Procedure Laterality Date  . Back surgery  2012    spinal fusion  . Back surgery  2012    MRSA infection- rods removed.  . Back surgery  2013    Rods put back in   Family History  Problem Relation Age of Onset  . Suicidality Father     Committed suicide   History  Substance Use Topics  . Smoking status: Former Games developer  . Smokeless tobacco: Never Used  . Alcohol Use: Yes     Comment: occasionally   OB History    No data available     Review of  Systems  Unable to perform ROS: Mental status change      Allergies  Review of patient's allergies indicates no known allergies.  Home Medications   Prior to Admission medications   Not on File   BP 117/56 mmHg  Pulse 70  Temp(Src) 98.1 F (36.7 C) (Oral)  Resp 20  Ht 5\' 2"  (1.575 m)  Wt 194 lb 10.7 oz (88.3 kg)  BMI 35.60 kg/m2  SpO2 100%  LMP 05/08/2014 Physical Exam  Constitutional:  Awake, alert, nontoxic appearance with baseline speech for patient.  HENT:  Head: Atraumatic.  Mouth/Throat: No oropharyngeal exudate.  Oral mucous membranes somewhat dry  Eyes: EOM are normal. Pupils are equal, round, and reactive to light. Right eye exhibits no discharge. Left eye exhibits no discharge.  Pupils dilated bilaterally  Neck: Neck supple.  Cardiovascular: Regular rhythm.   No murmur heard. Tachycardic  Pulmonary/Chest: Effort normal and breath sounds normal. No stridor. No respiratory distress. She has no wheezes. She has no rales. She exhibits no tenderness.  Abdominal: Soft. Bowel sounds are normal. She exhibits no mass. There is no tenderness. There is no rebound.  Musculoskeletal: She exhibits no tenderness.  Baseline ROM, moves extremities with no obvious new focal weakness.  Lymphadenopathy:    She has no cervical adenopathy.  Neurological: She is alert.  Awake, alert, oriented to person only, cooperative but unaware of situation; motor strength  5/5 bilaterally; sensation normal to light touch bilaterally; peripheral visual fields full to confrontation; no facial asymmetry; tongue midline; major cranial nerves appear intact; no pronator drift, normal finger to nose bilaterally  Skin: No rash noted.  Psychiatric: She has a normal mood and affect.  Nursing note and vitals reviewed.   ED Course  Procedures (including critical care time) Patient awake alert but still remains confused oriented to person only not to time or to place however now states that she  overdosed on some of her unknown old psychiatric medicines in order to get high and still denies suicidal ideation or homicidal ideation or hallucinations. [facility] paged for admit. Pt not medically clear for Psych admit. Pupils still dilated however tachycardia resolved with pulse now in the 70s with sinus rhythm on the monitor. 0005 Patient / Family / Caregiver informed of clinical course, understand medical decision-making process, and agree with plan. Labs Review Labs Reviewed  CBC WITH DIFFERENTIAL - Abnormal; Notable for the following:    Neutrophils Relative % 89 (*)    Lymphocytes Relative 10 (*)    Lymphs Abs 0.4 (*)    Monocytes Relative 1 (*)    Monocytes Absolute 0.0 (*)    All other components within normal limits  COMPREHENSIVE METABOLIC PANEL - Abnormal; Notable for the following:    Glucose, Bld 134 (*)    All other components within normal limits  URINALYSIS, ROUTINE W REFLEX MICROSCOPIC - Abnormal; Notable for the following:    Specific Gravity, Urine 1.004 (*)    All other components within normal limits  SALICYLATE LEVEL - Abnormal; Notable for the following:    Salicylate Lvl <2.0 (*)    All other components within normal limits  BLOOD GAS, ARTERIAL - Abnormal; Notable for the following:    pH, Arterial 7.451 (*)    pCO2 arterial 34.8 (*)    pO2, Arterial 58.1 (*)    All other components within normal limits  BASIC METABOLIC PANEL - Abnormal; Notable for the following:    Glucose, Bld 115 (*)    All other components within normal limits  CBC - Abnormal; Notable for the following:    Hemoglobin 11.4 (*)    HCT 34.8 (*)    All other components within normal limits  CBC - Abnormal; Notable for the following:    RBC 3.76 (*)    Hemoglobin 11.0 (*)    HCT 33.3 (*)    All other components within normal limits  COMPREHENSIVE METABOLIC PANEL - Abnormal; Notable for the following:    Potassium 3.4 (*)    Calcium 8.2 (*)    Total Protein 5.7 (*)    Albumin 2.9 (*)     All other components within normal limits  CBG MONITORING, ED - Abnormal; Notable for the following:    Glucose-Capillary 129 (*)    All other components within normal limits  MRSA PCR SCREENING  ACETAMINOPHEN LEVEL  AMMONIA  URINE RAPID DRUG SCREEN (HOSP PERFORMED)  ETHANOL  BASIC METABOLIC PANEL  DRUGS OF ABUSE SCREEN W/O ALC, ROUTINE URINE  MAGNESIUM  BASIC METABOLIC PANEL  I-STAT CG4 LACTIC ACID, ED  I-STAT BETA HCG BLOOD, ED ([facility], [facility], [facility] ONLY)    Imaging Review No results found.   EKG Interpretation   Date/Time:  Saturday May 22 2014 22:37:13 EST Ventricular Rate:  103 PR Interval:  155 QRS Duration: 86 QT Interval:  358 QTC Calculation: 469 R Axis:   84 Text Interpretation:  Sinus tachycardia Compared to  previous tracing Rate  faster Confirmed by [facility]ED[facility]AR  MD, Jonny RuizJOHN ([id number]) on 05/22/2014 10:40:01 PM      MDM   Final diagnoses:  Altered mental status  Headache  Overdose, undetermined intent, initial encounter    The patient appears reasonably stabilized for admission considering the current resources, flow, and capabilities available in the ED at this time, and I doubt any other [facility] requiring further screening and/or treatment in the ED prior to admission.    Hurman HornJohn M Shacarra Choe, MD 05/27/14 [phone]

## 2014-05-22 NOTE — ED Notes (Signed)
Bed: WA17 Expected date:  Expected time:  Means of arrival:  Comments: Triage 3 

## 2014-05-22 NOTE — ED Notes (Signed)
MD at bedside. 

## 2014-05-23 DIAGNOSIS — T50904A Poisoning by unspecified drugs, medicaments and biological substances, undetermined, initial encounter: Secondary | ICD-10-CM

## 2014-05-23 DIAGNOSIS — R404 Transient alteration of awareness: Secondary | ICD-10-CM

## 2014-05-23 DIAGNOSIS — G9341 Metabolic encephalopathy: Secondary | ICD-10-CM

## 2014-05-23 DIAGNOSIS — T50901A Poisoning by unspecified drugs, medicaments and biological substances, accidental (unintentional), initial encounter: Secondary | ICD-10-CM | POA: Diagnosis present

## 2014-05-23 DIAGNOSIS — R4182 Altered mental status, unspecified: Secondary | ICD-10-CM | POA: Diagnosis present

## 2014-05-23 DIAGNOSIS — F332 Major depressive disorder, recurrent severe without psychotic features: Secondary | ICD-10-CM

## 2014-05-23 LAB — CBC
HCT: 34.8 % — ABNORMAL LOW (ref 36.0–46.0)
Hemoglobin: 11.4 g/dL — ABNORMAL LOW (ref 12.0–15.0)
MCH: 29.1 pg (ref 26.0–34.0)
MCHC: 32.8 g/dL (ref 30.0–36.0)
MCV: 88.8 fL (ref 78.0–100.0)
PLATELETS: 256 10*3/uL (ref 150–400)
RBC: 3.92 MIL/uL (ref 3.87–5.11)
RDW: 14.4 % (ref 11.5–15.5)
WBC: 5.1 10*3/uL (ref 4.0–10.5)

## 2014-05-23 LAB — BASIC METABOLIC PANEL
ANION GAP: 10 (ref 5–15)
BUN: 6 mg/dL (ref 6–23)
CO2: 23 mEq/L (ref 19–32)
Calcium: 9.1 mg/dL (ref 8.4–10.5)
Chloride: 105 mEq/L (ref 96–112)
Creatinine, Ser: 0.55 mg/dL (ref 0.50–1.10)
GFR calc Af Amer: >90 mL/min (ref >90)
GLUCOSE: 115 mg/dL — AB (ref 70–99)
POTASSIUM: 3.9 meq/L (ref 3.7–5.3)
Sodium: 138 mEq/L (ref 137–147)

## 2014-05-23 LAB — ETHANOL

## 2014-05-23 LAB — MRSA PCR SCREENING: MRSA by PCR: NEGATIVE

## 2014-05-23 MED ORDER — LORAZEPAM 2 MG/ML IJ SOLN
1.0000 mg | Freq: Four times a day (QID) | INTRAMUSCULAR | Status: AC | PRN
  Administered 2014-05-23 – 2014-05-24 (×2): 1 mg via INTRAVENOUS
  Filled 2014-05-23 (×2): qty 1

## 2014-05-23 MED ORDER — VITAMIN B-1 100 MG PO TABS
100.0000 mg | ORAL_TABLET | Freq: Every day | ORAL | Status: DC
  Administered 2014-05-25 – 2014-05-26 (×2): 100 mg via ORAL
  Filled 2014-05-23 (×2): qty 1

## 2014-05-23 MED ORDER — HYDROMORPHONE HCL 1 MG/ML IJ SOLN: 0.5000 mg | INTRAMUSCULAR | Status: DC | PRN

## 2014-05-23 MED ORDER — ONDANSETRON HCL 4 MG PO TABS: 4.0000 mg | ORAL_TABLET | Freq: Four times a day (QID) | ORAL | Status: DC | PRN

## 2014-05-23 MED ORDER — ACETAMINOPHEN 650 MG RE SUPP: 650.0000 mg | Freq: Four times a day (QID) | RECTAL | Status: DC | PRN

## 2014-05-23 MED ORDER — OXYCODONE HCL 5 MG PO TABS: 5.0000 mg | ORAL_TABLET | ORAL | Status: DC | PRN

## 2014-05-23 MED ORDER — ENOXAPARIN SODIUM 40 MG/0.4ML ~~LOC~~ SOLN
40.0000 mg | SUBCUTANEOUS | Status: DC
  Administered 2014-05-23 – 2014-05-26 (×4): 40 mg via SUBCUTANEOUS
  Filled 2014-05-23 (×4): qty 0.4

## 2014-05-23 MED ORDER — ACETAMINOPHEN 325 MG PO TABS: 650.0000 mg | ORAL_TABLET | Freq: Four times a day (QID) | ORAL | Status: DC | PRN

## 2014-05-23 MED ORDER — ONDANSETRON HCL 4 MG/2ML IJ SOLN: 4.0000 mg | Freq: Four times a day (QID) | INTRAMUSCULAR | Status: DC | PRN

## 2014-05-23 MED ORDER — THIAMINE HCL 100 MG/ML IJ SOLN
100.0000 mg | Freq: Every day | INTRAMUSCULAR | Status: DC
  Administered 2014-05-23 – 2014-05-24 (×2): 100 mg via INTRAVENOUS
  Filled 2014-05-23: qty 2
  Filled 2014-05-23: qty 1
  Filled 2014-05-23: qty 2

## 2014-05-23 MED ORDER — SODIUM CHLORIDE 0.9 % IV SOLN
INTRAVENOUS | Status: DC
  Administered 2014-05-23 (×3): via INTRAVENOUS

## 2014-05-23 MED ORDER — ALUM & MAG HYDROXIDE-SIMETH 200-200-20 MG/5ML PO SUSP: 30.0000 mL | Freq: Four times a day (QID) | ORAL | Status: DC | PRN

## 2014-05-23 MED ORDER — FOLIC ACID 1 MG PO TABS
1.0000 mg | ORAL_TABLET | Freq: Every day | ORAL | Status: DC
  Administered 2014-05-25 – 2014-05-26 (×2): 1 mg via ORAL
  Filled 2014-05-23 (×3): qty 1

## 2014-05-23 MED ORDER — ADULT MULTIVITAMIN W/MINERALS CH
1.0000 | ORAL_TABLET | Freq: Every day | ORAL | Status: DC
  Administered 2014-05-25 – 2014-05-26 (×2): 1 via ORAL
  Filled 2014-05-23 (×2): qty 1

## 2014-05-23 MED ORDER — LORAZEPAM 1 MG PO TABS: 1.0000 mg | ORAL_TABLET | Freq: Four times a day (QID) | ORAL | Status: AC | PRN

## 2014-05-23 NOTE — H&P (Addendum)
[facility] Admission History and Physical       Karen DrownMarissa J Abalos ZOX:[id number] DOB: 21-Feb-1996 DOA: 05/22/2014  Referring physician: EDP PCP: No PCP Per Patient  Specialists:   Chief Complaint: Overdose  HPI: Karen Kim is a 18 y.o. female with a history of Bipolar disorder,a dn Major Depressive Disorder who was brought to the ED after a suspected overdose on her psych medications.  The patient is unable to give her history at this time due to delirium, and her mother gives the history.  Her Mother returned from work this evening at 7 pm and the patient was confused and not making any sense and had rambling speech and was walking around in circles.  She had last been seen normal at 5 pm when her brother saw her go into her room.   The patient at times interjects several different stories: 1) that her Ex-boyfriend told her to kill herself and she took the pills,  2) then another time she stated that she took the pills trying to get high and she was not trying to kill herself.     Her mother reported that the patient has been off her medications for the past 1-2 months and has not seen her psychiatrist in 2 months due to no longer having insurance.   Her mother drove her to the ED.    In the ED the UDS , Alcohol , Acetaminophen, and Salicylate levels were all negative initially.     Review of Systems: Unable to Obtain from the Patient  Past Medical History  Diagnosis Date  . Scoliosis   . MRSA (methicillin resistant staph aureus) culture positive   . Bipolar affective   . Depression   . Gonorrhea       Past Surgical History  Procedure Laterality Date  . Back surgery  2012    spinal fusion  . Back surgery  2012    MRSA infection- rods removed.  . Back surgery  2013    Rods put back in       Prior to Admission medications   Medication Sig Start Date End Date Taking? Authorizing Provider  ciprofloxacin (CIPRO) 500 MG tablet Take 1 tablet (500 mg total) by  mouth 2 (two) times daily. 04/09/14   Olivia Mackielga M Otter, MD  naproxen (NAPROSYN) 500 MG tablet Take 1 tablet (500 mg total) by mouth 2 (two) times daily with a meal. 04/09/14   Olivia Mackielga M Otter, MD  predniSONE (DELTASONE) 10 MG tablet Take 2 tablets (20 mg total) by mouth daily. Take 60 mg by mouth x 7 days, then 40 mg x 3 days, then 20mg  x 2 days, then 10 mg x 2 days. 03/22/14   Harle BattiestElizabeth Tysinger, NP      No Known Allergies   Social History:  reports that she has quit smoking. She has never used smokeless tobacco. She reports that she drinks alcohol. She reports that she uses illicit drugs (Marijuana).     Family History  Problem Relation Age of Onset  . Suicidality Father     Committed suicide       Physical Exam:  GEN:  Drowsy, Obese  18 y.o. Caucasian female examined and in no acute distress; cooperative with exam Filed Vitals:   05/22/14 2315 05/23/14 0000 05/23/14 0015 05/23/14 0030  BP: 106/64 114/64 104/85 115/68  Pulse: 76 76 78 80  Temp:      TempSrc:      Resp: 21 22 18  34  Height:      SpO2: 98% 97% 94% 96%   Blood pressure 115/68, pulse 80, temperature 97.7 F (36.5 C), temperature source Oral, resp. rate 34, height 5\' 2"  (1.575 m), last menstrual period 05/08/2014, SpO2 96 %. PSYCH: SHe is alert and oriented x1; does not appear anxious does not appear depressed; affect is normal HEENT: Normocephalic and Atraumatic, Mucous membranes pink; Pupils Dilated and Sluggish but reactive to Light,  EOM intact; Fundi:  Benign;  No scleral icterus, Nares: Patent, Oropharynx: Clear, Fair Dentition,    Neck:  FROM, No Cervical Lymphadenopathy nor Thyromegaly or Carotid Bruit; No JVD; Breasts:: Not examined CHEST WALL: No tenderness CHEST: Normal respiration, clear to auscultation bilaterally HEART: Regular rate and rhythm; no murmurs rubs or gallops BACK: No kyphosis or scoliosis; No CVA tenderness ABDOMEN: Positive Bowel Sounds Obese, Soft Non-Tender; No Masses, No Organomegaly.     Rectal Exam: Not done EXTREMITIES: No  Cyanosis, Clubbing, or Edema; No Ulcerations. Genitalia: not examined PULSES: 2+ and symmetric SKIN: Normal hydration no rash or ulceration CNS:  Alert and oriented x 1, No Focal Deficits Vascular: pulses palpable throughout    Labs on Admission:  Basic Metabolic Panel:  Recent Labs Lab 05/22/14 2221  NA 137  K 4.1  CL 100  CO2 23  GLUCOSE 134*  BUN 8  CREATININE 0.60  CALCIUM 9.8   Liver Function Tests:  Recent Labs Lab 05/22/14 2221  AST 15  ALT 9  ALKPHOS 57  BILITOT 0.6  PROT 8.1  ALBUMIN 4.0   No results for input(s): LIPASE, AMYLASE in the last 168 hours.  Recent Labs Lab 05/22/14 2222  AMMONIA 23   CBC:  Recent Labs Lab 05/22/14 2221  WBC 4.3  NEUTROABS 3.9  HGB 12.8  HCT 38.5  MCV 89.5  PLT 246   Cardiac Enzymes: No results for input(s): CKTOTAL, CKMB, CKMBINDEX, TROPONINI in the last 168 hours.  BNP (last 3 results) No results for input(s): PROBNP in the last 8760 hours. CBG:  Recent Labs Lab 05/22/14 2239  GLUCAP 129*    Radiological Exams on Admission: Dg Chest 2 View  05/22/2014   CLINICAL DATA:  Altered mental status. Possible overdose. Initial encounter.  EXAM: CHEST  2 VIEW  COMPARISON:  Radiographs 04/09/2014 and 08/10/2011.  FINDINGS: The heart size and mediastinal contours are stable. The lungs are clear. There is no pleural effusion or pneumothorax. Thoracolumbar spinal fixation rods are grossly stable, incompletely visualized. A mild residual scoliosis appears unchanged. No acute osseous findings are evident.  IMPRESSION: No active cardiopulmonary process. Spinal fixation rods appear unchanged.   Electronically Signed   By: Roxy Horseman M.D.   On: 05/22/2014 23:45   Ct Head Wo Contrast  05/22/2014   CLINICAL DATA:  Initial evaluation for altered mental status, headache. Possible drug overdose.  EXAM: CT HEAD WITHOUT CONTRAST  TECHNIQUE: Contiguous axial images were obtained from the  base of the skull through the vertex without intravenous contrast.  COMPARISON:  None.  FINDINGS: There is no acute intracranial hemorrhage or infarct. There is a in 5 mm round hyperdense lesion located near the foramen of Monro, suspicious for a small colloid cyst (series 2, image 12). Gray-white matter differentiation is well maintained. Ventricles are normal in size without evidence of hydrocephalus. CSF containing spaces are within normal limits. No extra-axial fluid collection. Round hyperdensity at the superior aspect of the sella on axial sequence (series 2, image 6) is most compatible with volume averaging, with no abnormal  density seen on associated coronal sequence.  The calvarium is intact.  Orbital soft tissues are within normal limits.  The paranasal sinuses and mastoid air cells are well pneumatized and free of fluid.  Scalp soft tissues are unremarkable.  IMPRESSION: 1. No acute intracranial process. 2. 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 No hydrocephalus.   Electronically Signed   By: Rise MuBenjamin  McClintock M.D.   On: 05/22/2014 22:17     EKG: Independently reviewed.    Assessment/Plan:   18 y.o. female with   Principal Problem:   1.   Overdose- presumably on Psych Meds   Stepdown ICU Monitoring   UDS Screen Negative   Acetaminophen, Salicylate, and ETOH levels  Negative   Psych consult when Alert and Medically Clear   Suicide Precautions and 1:1 Sitter  Active Problems:   2.   Metabolic encephalopathy   Due to Overdose     3.   Bipolar affective-    previously on Abilify, Lamictal, and Hydroxyzine Rx         4.   MDD (major depressive disorder), recurrent episode, severe   Chronic     5.   DVT Prophylaxis    Lovenox    Code Status:     FULL CODE Family Communication:   Mother at Bedside Disposition Plan:    [facility]     Time spent:  5960 Minutes  Ron ParkerJENKINS,Treasa Bradshaw C [facility] Pager  [phone]   If 7AM -7PM Please Contact the Day Rounding Team MD for [facility]  If 7PM-7AM, Please Contact Night-Floor Coverage  [email or web address] Password [email or web address] 05/23/2014, 1:02 AM

## 2014-05-23 NOTE — Plan of Care (Signed)
Problem: Consults Goal: Skin Care Protocol Initiated - if Braden Score 18 or less If consults are not indicated, leave blank or document N/A Outcome: Not Applicable Date Met:  85/27/78  Problem: Phase I Progression Outcomes Goal: OOB as tolerated unless otherwise ordered Outcome: Completed/Met Date Met:  05/23/14 Pt out of bed to bedside commode and bathroom with standby assistance Goal: Voiding-avoid urinary catheter unless indicated Outcome: Completed/Met Date Met:  05/23/14  Problem: Phase II Progression Outcomes Goal: Progress activity as tolerated unless otherwise ordered Outcome: Progressing Goal: Vital signs remain stable Outcome: Progressing Goal: Obtain order to discontinue catheter if appropriate Outcome: Not Applicable Date Met:  24/23/53  Problem: Phase III Progression Outcomes Goal: Voiding independently Outcome: Completed/Met Date Met:  05/23/14 Goal: Foley discontinued Outcome: Not Applicable Date Met:  61/44/31

## 2014-05-23 NOTE — Progress Notes (Signed)
Patient ID: Karen Kim, female   DOB: 03/20/1996, 18 y.o.   MRN: [id number]  [facility] PROGRESS NOTE  Karen Kim [id number] DOB: 03/20/1996 DOA: 05/22/2014 PCP: No PCP Per Patient  Brief narrative: 18 y.o. female with a history of bipolar disorder and major depressive disorder who was brought to the ED after a suspected overdose on her psych medications (unclear which ones). Pt was brought to the ED by her mom. The patient was unable to provide any history at the time of the admission and morning of 11/22 still rather agitated and unable to provide meaningful history. Mother present at bedside explained she came home from work at 7 pm 11/21 and found her daughter to be confused with rumbled speech, walking around the house and becoming increasingly agitated. She was apparently seen by her brother at 5 pm same day with normal and stable mental state. Mother explained that pt has been off of her psych medications for 1-2 months and has not seen psychiatrist for 2 months due to lack of medical insurance. Pt   In ED, pt was hemodynamically stable but agitated and unable to provide any history. [facility] asked to admit to SDU for further evaluation.   Assessment and Plan:    Principal Problem:   Acute encephalopathy - unclear what the exact etiology is, unclear which antipsychotic medications patient took if any - this could be related to bipolar disorder itself uncontrolled as pt has not been on medications - no clear metabolic cause, electrolytes are stable, CXR and UA with no signs of an infectious etiology  - UDS negative, no salicylates or acetaminophen noted on blood work, no alcohol in the blood - pt with persistent agitation this AM 11/22, will place on CIWA protocol and provide ativan as needed - keep in SDU for now until pt more stable    Overdose - unclear which medications pt took - continue to provide sitter at bedside with suicide precautions - psych evaluation  once pt more medically stable and able to participate  Active Problems:   Acute retention - > 700 cc in bladder, foley placed    Bipolar affective with MDD, recurrent and severe episode  - plan on psych consult as noted above in next 24 - 48 hours   DVT prophylaxis  Lovenox SQ while pt is in hospital  Code Status: Full Family Communication: Mother at bedside Disposition Plan: Keep in SDU    IV Access:   Peripheral IV Procedures and diagnostic studies:   Dg Chest 2 View  05/22/2014  No active cardiopulmonary process. Spinal fixation rods appear unchanged.    Ct Head Wo Contrast  05/22/2014   No acute intracranial process. 2. 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 No hydrocephalus.    Medical Consultants:   None  Other Consultants:   None   Anti-Infectives:   None  Debbora PrestoMAGICK-Eyal Greenhaw, MD  [facility] Pager [phone]  If 7PM-7AM, please contact night-coverage [email or web address] Password [email or web address] 05/23/2014, 11:42 AM   LOS: 1 day   HPI/Subjective: No events overnight.   Objective: Filed Vitals:   05/23/14 0700 05/23/14 0728 05/23/14 0827 05/23/14 1109  BP:   112/58 108/68  Pulse: 89  81 89  Temp:  98.3 F (36.8 C)    TempSrc:  Oral    Resp: 18  14 18   Height:      Weight:      SpO2: 96%  95% 97%  Intake/Output Summary (Last 24 hours) at 05/23/14 1142 Last data filed at 05/23/14 0955  Gross per 24 hour  Intake   1400 ml  Output   1050 ml  Net    350 ml    Exam:   General:  Pt is alert, agitated, pulling lines, unable to cooperate with questions, difficult to perform exam due to combativeness   Cardiovascular: Regular rate and rhythm, S1/S2, no murmurs, no rubs, no gallops  Respiratory: Clear to auscultation bilaterally, no wheezing, no crackles, no rhonchi  Abdomen: Soft, non tender, non distended, bowel sounds present, no guarding  Extremities: No edema, pulses DP and PT palpable bilaterally   Data  Reviewed: Basic Metabolic Panel:  Recent Labs Lab 05/22/14 2221 05/23/14 0409  NA 137 138  K 4.1 3.9  CL 100 105  CO2 23 23  GLUCOSE 134* 115*  BUN 8 6  CREATININE 0.60 0.55  CALCIUM 9.8 9.1   Liver Function Tests:  Recent Labs Lab 05/22/14 2221  AST 15  ALT 9  ALKPHOS 57  BILITOT 0.6  PROT 8.1  ALBUMIN 4.0    Recent Labs Lab 05/22/14 2222  AMMONIA 23   CBC:  Recent Labs Lab 05/22/14 2221 05/23/14 0409  WBC 4.3 5.1  NEUTROABS 3.9  --   HGB 12.8 11.4*  HCT 38.5 34.8*  MCV 89.5 88.8  PLT 246 256   CBG:  Recent Labs Lab 05/22/14 2239  GLUCAP 129*    Recent Results (from the past 240 hour(s))  MRSA PCR Screening     Status: None   Collection Time: 05/23/14  5:20 AM  Result Value Ref Range Status   MRSA by PCR NEGATIVE NEGATIVE Final    Comment:        The GeneXpert MRSA Assay (FDA approved for NASAL specimens only), is one component of a comprehensive MRSA colonization surveillance program. It is not intended to diagnose MRSA infection nor to guide or monitor treatment for MRSA infections.      Scheduled Meds: . enoxaparin (LOVENOX) injection  40 mg Subcutaneous Q24H  . folic acid  1 mg Oral Daily  . multivitamin with minerals  1 tablet Oral Daily  . sodium chloride  500 mL Intravenous Once  . thiamine  100 mg Oral Daily   Or  . thiamine  100 mg Intravenous Daily   Continuous Infusions: . sodium chloride 100 mL/hr at 05/23/14 1056

## 2014-05-24 DIAGNOSIS — I959 Hypotension, unspecified: Secondary | ICD-10-CM

## 2014-05-24 DIAGNOSIS — R410 Disorientation, unspecified: Secondary | ICD-10-CM

## 2014-05-24 DIAGNOSIS — T50902D Poisoning by unspecified drugs, medicaments and biological substances, intentional self-harm, subsequent encounter: Secondary | ICD-10-CM

## 2014-05-24 DIAGNOSIS — R45851 Suicidal ideations: Secondary | ICD-10-CM

## 2014-05-24 DIAGNOSIS — F3130 Bipolar disorder, current episode depressed, mild or moderate severity, unspecified: Secondary | ICD-10-CM

## 2014-05-24 LAB — BASIC METABOLIC PANEL
Anion gap: 12 (ref 5–15)
BUN: 8 mg/dL (ref 6–23)
CALCIUM: 9.2 mg/dL (ref 8.4–10.5)
CO2: 23 mEq/L (ref 19–32)
CREATININE: 0.63 mg/dL (ref 0.50–1.10)
Chloride: 104 mEq/L (ref 96–112)
GFR calc non Af Amer: >90 mL/min (ref >90)
Glucose, Bld: 94 mg/dL (ref 70–99)
Potassium: 3.9 mEq/L (ref 3.7–5.3)
Sodium: 139 mEq/L (ref 137–147)

## 2014-05-24 LAB — MAGNESIUM: MAGNESIUM: 1.9 mg/dL (ref 1.5–2.5)

## 2014-05-24 LAB — CBC
HEMATOCRIT: 33.3 % — AB (ref 36.0–46.0)
Hemoglobin: 11 g/dL — ABNORMAL LOW (ref 12.0–15.0)
MCH: 29.3 pg (ref 26.0–34.0)
MCHC: 33 g/dL (ref 30.0–36.0)
MCV: 88.6 fL (ref 78.0–100.0)
Platelets: 214 10*3/uL (ref 150–400)
RBC: 3.76 MIL/uL — ABNORMAL LOW (ref 3.87–5.11)
RDW: 14.4 % (ref 11.5–15.5)
WBC: 7.2 10*3/uL (ref 4.0–10.5)

## 2014-05-24 MED ORDER — LORAZEPAM 2 MG/ML IJ SOLN
1.0000 mg | Freq: Once | INTRAMUSCULAR | Status: AC
  Administered 2014-05-24: 1 mg via INTRAVENOUS
  Filled 2014-05-24: qty 1

## 2014-05-24 MED ORDER — HALOPERIDOL LACTATE 5 MG/ML IJ SOLN
1.0000 mg | Freq: Four times a day (QID) | INTRAMUSCULAR | Status: DC | PRN
  Administered 2014-05-24 – 2014-05-25 (×3): 1 mg via INTRAVENOUS
  Filled 2014-05-24 (×3): qty 1

## 2014-05-24 MED ORDER — SODIUM CHLORIDE 0.9 % IV BOLUS (SEPSIS)
1000.0000 mL | Freq: Once | INTRAVENOUS | Status: AC
  Administered 2014-05-24: 1000 mL via INTRAVENOUS

## 2014-05-24 MED ORDER — SODIUM CHLORIDE 0.9 % IV SOLN
INTRAVENOUS | Status: DC
  Administered 2014-05-24 – 2014-05-26 (×3): via INTRAVENOUS

## 2014-05-24 NOTE — Progress Notes (Signed)
[facility] PROGRESS NOTE  Karen DrownMarissa J Kim ZOX:[id number] DOB: Feb 08, 1996 DOA: 05/22/2014 PCP: No PCP Per Patient   Brief narrative 18 year old female with history of bipolar disorder and major depressive disorder brought to the ED by her mother after a suspected drug overdose possibly of her psych medications (patient's mother found empty bottle of lamictal and half empty bottle of abilify) . Patient has been [facility] for drug overdose. When patient's mother came back from work she found her to be confused with normal speech walking around the house agitated. She last saw her the previous evening and was normal. Patient had stopped taking her psych meds for past 3 months after she finished her school and also had stopped seeing her psychiatrist. Patient admitted to step down monitoring.  Assessment/Plan: Acute encephalopathy possibly secondary to drug overdose  It is unclear what all patient could have ingested. Her mother found empty bottle of Lamictal and half empty bottle of Abilify. Her urine drug screen on admission was positive for cannabis. [facility]. -Patient actively hallucinating and agitated. When necessary IV Ativan not helping her symptoms. Will place on when necessary Haldol. Continue Recruitment consultantsafety sitter. -Monitor on CIWA. -Noted for low blood pressure this morning increase IV fluid rate to 1 25 mL per hour. -Psychiatry consulted. Will follow with recommendations. -Monitor in step down unit. -Monitor electrolytes  Active problems Acute urinary retention Foley placed on admission.  Hypertension Monitor with IV fluids  Bipolar affective disorder with MDD patient stopped taking  medications for almost 3 months.  Diet: Nothing by mouth DVT prophylaxis: Subcutaneous Lovenox  Code Status: Full code Family Communication: Mother at bedside Disposition Plan: Continue monitoring in  stepdown. Needs inpatient psych facility once medically stable   Consultants:  Psychiatry  Procedures:  None  Antibiotics:  None  HPI/Subjective: Patient seen. Agitated on attempting to exam. Reportedly agitated trying to get out from bed and hallicinating  Objective: Filed Vitals:   05/24/14 1001  BP:   Pulse: 41  Temp:   Resp: 20    Intake/Output Summary (Last 24 hours) at 05/24/14 1243 Last data filed at 05/24/14 1000  Gross per 24 hour  Intake 1323.67 ml  Output   1565 ml  Net -241.33 ml   Filed Weights   05/23/14 0400 05/24/14 0507  Weight: 85.4 kg (188 lb 4.4 oz) 88.3 kg (194 lb 10.7 oz)    Exam:   General: Young female lying in bed in no acute distress, confused and hallucinating  HEENT: Dilated pupils, moist oral mucosa Unable to perform remaining medical exam as patient agitated and trying to hit back when attempted to exam  Data Reviewed: Basic Metabolic Panel:  Recent Labs Lab 05/22/14 2221 05/23/14 0409 05/24/14 0345  NA 137 138 139  K 4.1 3.9 3.9  CL 100 105 104  CO2 23 23 23   GLUCOSE 134* 115* 94  BUN 8 6 8   CREATININE 0.60 0.55 0.63  CALCIUM 9.8 9.1 9.2  MG  --   --  1.9   Liver Function Tests:  Recent Labs Lab 05/22/14 2221  AST 15  ALT 9  ALKPHOS 57  BILITOT 0.6  PROT 8.1  ALBUMIN 4.0   No results for input(s): LIPASE, AMYLASE in the last 168 hours.  Recent Labs Lab 05/22/14 2222  AMMONIA 23   CBC:  Recent Labs Lab 05/22/14 2221 05/23/14 0409 05/24/14 0345  WBC 4.3 5.1 7.2  NEUTROABS  3.9  --   --   HGB 12.8 11.4* 11.0*  HCT 38.5 34.8* 33.3*  MCV 89.5 88.8 88.6  PLT 246 256 214   Cardiac Enzymes: No results for input(s): CKTOTAL, CKMB, CKMBINDEX, TROPONINI in the last 168 hours. BNP (last 3 results) No results for input(s): PROBNP in the last 8760 hours. CBG:  Recent Labs Lab 05/22/14 2239  GLUCAP 129*    Recent Results (from the past 240 hour(s))  MRSA PCR Screening     Status: None    Collection Time: 05/23/14  5:20 AM  Result Value Ref Range Status   MRSA by PCR NEGATIVE NEGATIVE Final    Comment:        The GeneXpert MRSA Assay (FDA approved for NASAL specimens only), is one component of a comprehensive MRSA colonization surveillance program. It is not intended to diagnose MRSA infection nor to guide or monitor treatment for MRSA infections.      Studies: Dg Chest 2 View  05/22/2014   CLINICAL DATA:  Altered mental status. Possible overdose. Initial encounter.  EXAM: CHEST  2 VIEW  COMPARISON:  Radiographs 04/09/2014 and 08/10/2011.  FINDINGS: The heart size and mediastinal contours are stable. The lungs are clear. There is no pleural effusion or pneumothorax. Thoracolumbar spinal fixation rods are grossly stable, incompletely visualized. A mild residual scoliosis appears unchanged. No acute osseous findings are evident.  IMPRESSION: No active cardiopulmonary process. Spinal fixation rods appear unchanged.   Electronically Signed   By: Roxy HorsemanBill  Veazey M.D.   On: 05/22/2014 23:45   Ct Head Wo Contrast  05/22/2014   CLINICAL DATA:  Initial evaluation for altered mental status, headache. Possible drug overdose.  EXAM: CT HEAD WITHOUT CONTRAST  TECHNIQUE: Contiguous axial images were obtained from the base of the skull through the vertex without intravenous contrast.  COMPARISON:  None.  FINDINGS: There is no acute intracranial hemorrhage or infarct. There is a in 5 mm round hyperdense lesion located near the foramen of Monro, suspicious for a small colloid cyst (series 2, image 12). Gray-white matter differentiation is well maintained. Ventricles are normal in size without evidence of hydrocephalus. CSF containing spaces are within normal limits. No extra-axial fluid collection. Round hyperdensity at the superior aspect of the sella on axial sequence (series 2, image 6) is most compatible with volume averaging, with no abnormal density seen on associated coronal sequence.  The  calvarium is intact.  Orbital soft tissues are within normal limits.  The paranasal sinuses and mastoid air cells are well pneumatized and free of fluid.  Scalp soft tissues are unremarkable.  IMPRESSION: 1. No acute intracranial process. 2. 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 No hydrocephalus.   Electronically Signed   By: Rise MuBenjamin  McClintock M.D.   On: 05/22/2014 22:17    Scheduled Meds: . enoxaparin (LOVENOX) injection  40 mg Subcutaneous Q24H  . folic acid  1 mg Oral Daily  . multivitamin with minerals  1 tablet Oral Daily  . sodium chloride  500 mL Intravenous Once  . thiamine  100 mg Oral Daily   Or  . thiamine  100 mg Intravenous Daily   Continuous Infusions: . sodium chloride 125 mL/hr at 05/24/14 1105      Time spent: 25 minutes    Ashaun Gaughan  [facility] Pager [phone] If 7PM-7AM, please contact night-coverage at [email or web address], password [email or web address] 05/24/2014, 12:43 PM  LOS: 2 days

## 2014-05-24 NOTE — Consult Note (Signed)
[facility] Face-to-Face Psychiatry Consult   Reason for Consult:  Drug overdose and bipolar disorder Referring Physician:  Dr. [name] is an 18 y.o. female. Total Time spent with patient: 45 minutes  Assessment: AXIS I:  Bipolar, Depressed AXIS II:  Cluster B Traits AXIS III:   Past Medical History  Diagnosis Date  . Scoliosis   . MRSA (methicillin resistant staph aureus) culture positive   . Bipolar affective   . Depression   . Gonorrhea    AXIS IV:  other psychosocial or environmental problems, problems related to social environment and problems with primary support group AXIS V:  41-50 serious symptoms  Plan:  Recommend psychiatric Inpatient admission when medically cleared. Supportive therapy provided about ongoing stressors.  Subjective:   Karen Kim is a 18 y.o. female patient admitted with Drug overdose and bipolar disorder.  HPI:  Karen Kim is a 18 y.o. female admitted to [facility] ICU after she overdosed her psych medication (Abilify and Lamictal unknown amount) which she has been non compliant over two months. Most of the information for this evaluation obtained from medical records and face to face with patient mother. patient is not able to participate in the evaluation due to sedation and not able wake up with several. verbal stimuli. Patient mother found her with altered mental status. Patient has been suffering with significant symptoms of bipolar disorder with mood swings, increased goal directed activities, impulsive and meeting strangers, arguments and relationship problems with her BF and trespassing charges at [employer] and intentional overdose on medication. She has history of cannabis abuse. She has multiple [facility] admissions and was received [facility] and her last visit is over two months ago and has no scheduled visits due to loosing her medicaid coverage. She is granduation  from high school and now no college or job. She does not listen parents but stay with her mother, step father and younger siblings. She has significant alcohol dependence in her biological father and grandma. Her dad committed suicide when she was 50 years old.   Medical History: patient with a history of Bipolar disorder who was brought to the ED after a suspected overdose on her psych medications. The patient is unable to give her history at this time due to delirium, and her mother gives the history. Her Mother returned from work this evening at 7 pm and the patient was confused and not making any sense and had rambling speech and was walking around in circles. She had last been seen normal at 5 pm when her brother saw her go into her room. The patient at times interjects several different stories: 1) that her Ex-boyfriend told her to kill herself and she took the pills, 2) then another time she stated that she took the pills trying to get high and she was not trying to kill herself. Her mother reported that the patient has been off her medications for the past 1-2 months and has not seen her psychiatrist in 2 months due to no longer having insurance. Her mother drove her to the ED. In the ED the UDS , Alcohol , Acetaminophen, and Salicylate levels were all negative initially.   HPI Elements:   Location:  Bipolar depression. Quality:  poor. Severity:  intentional overdose. Timing:  unknown stresses. Duration:  two months. Context:  non complaint with treatment as her medicaid was expired.  Past Psychiatric History: Past Medical History  Diagnosis Date  .  Scoliosis   . MRSA (methicillin resistant staph aureus) culture positive   . Bipolar affective   . Depression   . Gonorrhea     reports that she has quit smoking. She has never used smokeless tobacco. She reports that she drinks alcohol. She reports that she uses illicit drugs (Marijuana). Family History  Problem Relation  Age of Onset  . Suicidality Father     Committed suicide     Living Arrangements: Parent   Abuse/Neglect [facility]) Physical Abuse: Denies Verbal Abuse: Denies Sexual Abuse: Denies Allergies:  No Known Allergies  ACT Assessment Complete:  NO Objective: Blood pressure 84/59, pulse 72, temperature 99.1 F (37.3 C), temperature source Axillary, resp. rate 28, height $RemoveBe'5\' 2"'bjChNPLTW$  (1.575 m), weight 88.3 kg (194 lb 10.7 oz), last menstrual period 05/08/2014, SpO2 96 %.Body mass index is 35.6 kg/(m^2). Results for orders placed or performed during the hospital encounter of 05/22/14 (from the past 72 hour(s))  Blood gas, arterial     Status: Abnormal   Collection Time: 05/22/14 10:15 PM  Result Value Ref Range   FIO2 0.21 %   pH, Arterial 7.451 (H) 7.350 - 7.450   pCO2 arterial 34.8 (L) 35.0 - 45.0 mmHg   pO2, Arterial 58.1 (L) 80.0 - 100.0 mmHg   Bicarbonate 23.9 20.0 - 24.0 mEq/L   TCO2 21.2 0 - 100 mmol/L   Acid-Base Excess 0.8 0.0 - 2.0 mmol/L   O2 Saturation 91.8 %   Patient temperature 98.6    Collection site RIGHT RADIAL    Drawn by [id number]    Sample type ARTERIAL DRAW    Allens test (pass/fail) PASS PASS  CBC with Differential     Status: Abnormal   Collection Time: 05/22/14 10:21 PM  Result Value Ref Range   WBC 4.3 4.0 - 10.5 K/uL   RBC 4.30 3.87 - 5.11 MIL/uL   Hemoglobin 12.8 12.0 - 15.0 g/dL   HCT 38.5 36.0 - 46.0 %   MCV 89.5 78.0 - 100.0 fL   MCH 29.8 26.0 - 34.0 pg   MCHC 33.2 30.0 - 36.0 g/dL   RDW 14.2 11.5 - 15.5 %   Platelets 246 150 - 400 K/uL   Neutrophils Relative % 89 (H) 43 - 77 %   Neutro Abs 3.9 1.7 - 7.7 K/uL   Lymphocytes Relative 10 (L) 12 - 46 %   Lymphs Abs 0.4 (L) 0.7 - 4.0 K/uL   Monocytes Relative 1 (L) 3 - 12 %   Monocytes Absolute 0.0 (L) 0.1 - 1.0 K/uL   Eosinophils Relative 0 0 - 5 %   Eosinophils Absolute 0.0 0.0 - 0.7 K/uL   Basophils Relative 0 0 - 1 %   Basophils Absolute 0.0 0.0 - 0.1 K/uL  Comprehensive metabolic panel     Status:  Abnormal   Collection Time: 05/22/14 10:21 PM  Result Value Ref Range   Sodium 137 137 - 147 mEq/L   Potassium 4.1 3.7 - 5.3 mEq/L   Chloride 100 96 - 112 mEq/L   CO2 23 19 - 32 mEq/L   Glucose, Bld 134 (H) 70 - 99 mg/dL   BUN 8 6 - 23 mg/dL   Creatinine, Ser 0.60 0.50 - 1.10 mg/dL   Calcium 9.8 8.4 - 10.5 mg/dL   Total Protein 8.1 6.0 - 8.3 g/dL   Albumin 4.0 3.5 - 5.2 g/dL   AST 15 0 - 37 U/L   ALT 9 0 - 35 U/L   Alkaline Phosphatase 57  39 - 117 U/L   Total Bilirubin 0.6 0.3 - 1.2 mg/dL   GFR calc non Af Amer >90 >90 mL/min   GFR calc Af Amer >90 >90 mL/min    Comment: (NOTE) The eGFR has been calculated using the CKD EPI equation. This calculation has not been validated in all clinical situations. eGFR's persistently <90 mL/min signify possible Chronic Kidney Disease.    Anion gap 14 5 - 15  Acetaminophen level     Status: None   Collection Time: 05/22/14 10:21 PM  Result Value Ref Range   Acetaminophen (Tylenol), Serum <15.0 10 - 30 ug/mL    Comment:        THERAPEUTIC CONCENTRATIONS VARY SIGNIFICANTLY. A RANGE OF 10-30 ug/mL MAY BE AN EFFECTIVE CONCENTRATION FOR MANY PATIENTS. HOWEVER, SOME ARE BEST TREATED AT CONCENTRATIONS OUTSIDE THIS RANGE. ACETAMINOPHEN CONCENTRATIONS >150 ug/mL AT 4 HOURS AFTER INGESTION AND >50 ug/mL AT 12 HOURS AFTER INGESTION ARE OFTEN ASSOCIATED WITH TOXIC REACTIONS.   Salicylate level     Status: Abnormal   Collection Time: 05/22/14 10:21 PM  Result Value Ref Range   Salicylate Lvl <2.5 (L) 2.8 - 20.0 mg/dL  Ethanol     Status: None   Collection Time: 05/22/14 10:21 PM  Result Value Ref Range   Alcohol, Ethyl (B) <11 0 - 11 mg/dL    Comment:        LOWEST DETECTABLE LIMIT FOR SERUM ALCOHOL IS 11 mg/dL FOR MEDICAL PURPOSES ONLY   Ammonia     Status: None   Collection Time: 05/22/14 10:22 PM  Result Value Ref Range   Ammonia 23 11 - 60 umol/L  I-Stat CG4 Lactic Acid, ED     Status: None   Collection Time: 05/22/14 10:38 PM   Result Value Ref Range   Lactic Acid, Venous 1.08 0.5 - 2.2 mmol/L  CBG monitoring, ED     Status: Abnormal   Collection Time: 05/22/14 10:39 PM  Result Value Ref Range   Glucose-Capillary 129 (H) 70 - 99 mg/dL   Comment 1 Notify RN   I-Stat beta hCG blood, ED ([facility], [facility], [facility] only)     Status: None   Collection Time: 05/22/14 10:47 PM  Result Value Ref Range   I-stat hCG, quantitative <5.0 <5 mIU/mL   Comment 3            Comment:   GEST. AGE      CONC.  (mIU/mL)   <=1 WEEK        5 - 50     2 WEEKS       50 - 500     3 WEEKS       100 - 10,000     4 WEEKS     1,000 - 30,000        FEMALE AND NON-PREGNANT FEMALE:     LESS THAN 5 mIU/mL   Urinalysis, Routine w reflex microscopic     Status: Abnormal   Collection Time: 05/22/14 10:52 PM  Result Value Ref Range   Color, Urine YELLOW YELLOW   APPearance CLEAR CLEAR   Specific Gravity, Urine 1.004 (L) 1.005 - 1.030   pH 7.0 5.0 - 8.0   Glucose, UA NEGATIVE NEGATIVE mg/dL   Hgb urine dipstick NEGATIVE NEGATIVE   Bilirubin Urine NEGATIVE NEGATIVE   Ketones, ur NEGATIVE NEGATIVE mg/dL   Protein, ur NEGATIVE NEGATIVE mg/dL   Urobilinogen, UA 0.2 0.0 - 1.0 mg/dL   Nitrite NEGATIVE NEGATIVE   Leukocytes, UA  NEGATIVE NEGATIVE    Comment: MICROSCOPIC NOT DONE ON URINES WITH NEGATIVE PROTEIN, BLOOD, LEUKOCYTES, NITRITE, OR GLUCOSE <1000 mg/dL.  Drug screen panel, emergency     Status: None   Collection Time: 05/22/14 10:57 PM  Result Value Ref Range   Opiates NONE DETECTED NONE DETECTED   Cocaine NONE DETECTED NONE DETECTED   Benzodiazepines NONE DETECTED NONE DETECTED   Amphetamines NONE DETECTED NONE DETECTED   Tetrahydrocannabinol NONE DETECTED NONE DETECTED   Barbiturates NONE DETECTED NONE DETECTED    Comment:        DRUG SCREEN FOR MEDICAL PURPOSES ONLY.  IF CONFIRMATION IS NEEDED FOR ANY PURPOSE, NOTIFY LAB WITHIN 5 DAYS.        LOWEST DETECTABLE LIMITS FOR URINE DRUG SCREEN Drug Class       Cutoff (ng/mL) Amphetamine       1000 Barbiturate      200 Benzodiazepine   625 Tricyclics       638 Opiates          300 Cocaine          300 THC              50   Basic metabolic panel     Status: Abnormal   Collection Time: 05/23/14  4:09 AM  Result Value Ref Range   Sodium 138 137 - 147 mEq/L   Potassium 3.9 3.7 - 5.3 mEq/L   Chloride 105 96 - 112 mEq/L   CO2 23 19 - 32 mEq/L   Glucose, Bld 115 (H) 70 - 99 mg/dL   BUN 6 6 - 23 mg/dL   Creatinine, Ser 0.55 0.50 - 1.10 mg/dL   Calcium 9.1 8.4 - 10.5 mg/dL   GFR calc non Af Amer >90 >90 mL/min   GFR calc Af Amer >90 >90 mL/min    Comment: (NOTE) The eGFR has been calculated using the CKD EPI equation. This calculation has not been validated in all clinical situations. eGFR's persistently <90 mL/min signify possible Chronic Kidney Disease.    Anion gap 10 5 - 15  CBC     Status: Abnormal   Collection Time: 05/23/14  4:09 AM  Result Value Ref Range   WBC 5.1 4.0 - 10.5 K/uL   RBC 3.92 3.87 - 5.11 MIL/uL   Hemoglobin 11.4 (L) 12.0 - 15.0 g/dL   HCT 34.8 (L) 36.0 - 46.0 %   MCV 88.8 78.0 - 100.0 fL   MCH 29.1 26.0 - 34.0 pg   MCHC 32.8 30.0 - 36.0 g/dL   RDW 14.4 11.5 - 15.5 %   Platelets 256 150 - 400 K/uL  MRSA PCR Screening     Status: None   Collection Time: 05/23/14  5:20 AM  Result Value Ref Range   MRSA by PCR NEGATIVE NEGATIVE    Comment:        The GeneXpert MRSA Assay (FDA approved for NASAL specimens only), is one component of a comprehensive MRSA colonization surveillance program. It is not intended to diagnose MRSA infection nor to guide or monitor treatment for MRSA infections.   CBC     Status: Abnormal   Collection Time: 05/24/14  3:45 AM  Result Value Ref Range   WBC 7.2 4.0 - 10.5 K/uL   RBC 3.76 (L) 3.87 - 5.11 MIL/uL   Hemoglobin 11.0 (L) 12.0 - 15.0 g/dL   HCT 33.3 (L) 36.0 - 46.0 %   MCV 88.6 78.0 - 100.0 fL   MCH 29.3 26.0 -  34.0 pg   MCHC 33.0 30.0 - 36.0 g/dL   RDW 14.4 11.5 - 15.5 %   Platelets 214 150 - 400  K/uL  Basic metabolic panel     Status: None   Collection Time: 05/24/14  3:45 AM  Result Value Ref Range   Sodium 139 137 - 147 mEq/L   Potassium 3.9 3.7 - 5.3 mEq/L   Chloride 104 96 - 112 mEq/L   CO2 23 19 - 32 mEq/L   Glucose, Bld 94 70 - 99 mg/dL   BUN 8 6 - 23 mg/dL   Creatinine, Ser 0.63 0.50 - 1.10 mg/dL   Calcium 9.2 8.4 - 10.5 mg/dL   GFR calc non Af Amer >90 >90 mL/min   GFR calc Af Amer >90 >90 mL/min    Comment: (NOTE) The eGFR has been calculated using the CKD EPI equation. This calculation has not been validated in all clinical situations. eGFR's persistently <90 mL/min signify possible Chronic Kidney Disease.    Anion gap 12 5 - 15  Magnesium     Status: None   Collection Time: 05/24/14  3:45 AM  Result Value Ref Range   Magnesium 1.9 1.5 - 2.5 mg/dL   Labs are reviewed.  Current Facility-Administered Medications  Medication Dose Route Frequency Provider Last Rate Last Dose  . 0.9 %  sodium chloride infusion   Intravenous Continuous Nishant Dhungel, MD 125 mL/hr at 05/24/14 0810    . acetaminophen (TYLENOL) tablet 650 mg  650 mg Oral Q6H PRN Theressa Millard, MD       Or  . acetaminophen (TYLENOL) suppository 650 mg  650 mg Rectal Q6H PRN Theressa Millard, MD      . alum & mag hydroxide-simeth (MAALOX/MYLANTA) 200-200-20 MG/5ML suspension 30 mL  30 mL Oral Q6H PRN Theressa Millard, MD      . enoxaparin (LOVENOX) injection 40 mg  40 mg Subcutaneous Q24H Theressa Millard, MD   40 mg at 05/24/14 0626  . folic acid (FOLVITE) tablet 1 mg  1 mg Oral Daily Theodis Blaze, MD   1 mg at 05/23/14 1251  . haloperidol lactate (HALDOL) injection 1 mg  1 mg Intravenous Q6H PRN Nishant Dhungel, MD   1 mg at 05/24/14 0919  . LORazepam (ATIVAN) tablet 1 mg  1 mg Oral Q6H PRN Theodis Blaze, MD       Or  . LORazepam (ATIVAN) injection 1 mg  1 mg Intravenous Q6H PRN Theodis Blaze, MD   1 mg at 05/24/14 0415  . multivitamin with minerals tablet 1 tablet  1 tablet Oral Daily  Theodis Blaze, MD   1 tablet at 05/23/14 1258  . ondansetron (ZOFRAN) tablet 4 mg  4 mg Oral Q6H PRN Theressa Millard, MD       Or  . ondansetron (ZOFRAN) injection 4 mg  4 mg Intravenous Q6H PRN Theressa Millard, MD      . oxyCODONE (Oxy IR/ROXICODONE) immediate release tablet 5 mg  5 mg Oral Q4H PRN Harvette Evonnie Dawes, MD      . sodium chloride 0.9 % bolus 500 mL  500 mL Intravenous Once Babette Relic, MD      . thiamine (VITAMIN B-1) tablet 100 mg  100 mg Oral Daily Theodis Blaze, MD       Or  . thiamine (B-1) injection 100 mg  100 mg Intravenous Daily Theodis Blaze, MD   100 mg at 05/24/14  0919    Psychiatric Specialty Exam: Physical Exam as per history and physical  ROS sedation   Blood pressure 84/59, pulse 72, temperature 99.1 F (37.3 C), temperature source Axillary, resp. rate 28, height $RemoveBe'5\' 2"'[id number]$  (1.575 m), weight 88.3 kg (194 lb 10.7 oz), last menstrual period 05/08/2014, SpO2 96 %.Body mass index is 35.6 kg/(m^2).  General Appearance: Disheveled and Guarded  Eye Sport and exercise psychologist::  Fair  Speech:  Slow  Volume:  Decreased  Mood:  Depressed  Affect:  Constricted and Depressed  Thought Process:  NA  Orientation:  NA  Thought Content:  NA  Suicidal Thoughts:  Yes.  with intent/plan  Homicidal Thoughts:  No  Memory:  NA  Judgement:  NA  Insight:  NA  Psychomotor Activity:  Decreased  Concentration:  NA  Recall:  NA  Fund of Knowledge:NA  Language: NA  Akathisia:  NA  Handed:  Right  AIMS (if indicated):     Assets:  Communication Skills Desire for Improvement Housing Intimacy Leisure Time [place] Talents/Skills  Sleep:      Musculoskeletal: Strength & Muscle Tone: decreased Gait & Station: unable to stand Patient leans: N/A  Treatment Plan Summary: Daily contact with patient to assess and evaluate symptoms and progress in treatment Medication management  Hold psych medication as she overdosed on psych  medications  Jessie Cowher,JANARDHAHA R. 05/24/2014 9:37 AM

## 2014-05-24 NOTE — Progress Notes (Signed)
Clinical Social Work  CSW received referral in order to complete psychosocial assessment. CSW spoke with bedside RN who reports patient and mother at bedside but just fell asleep. CSW will follow up at later time to complete full assessment.  [place] City[place] Cecilia [place], [place] [phone]

## 2014-05-24 NOTE — Progress Notes (Signed)
Pt urine output 200cc over 6 hour period. Foley in place. MD Dhungel made aware and ordered a bladder scan. Bladder scan showed 48mL. Since foley is not kinked and pt is not retaining, MD orderded 1L bolus. Will administer and continue to monitor.

## 2014-05-25 LAB — COMPREHENSIVE METABOLIC PANEL
ALBUMIN: 2.9 g/dL — AB (ref 3.5–5.2)
ALT: <5 U/L (ref 0–35)
ANION GAP: 10 (ref 5–15)
AST: 9 U/L (ref 0–37)
Alkaline Phosphatase: 43 U/L (ref 39–117)
BUN: 11 mg/dL (ref 6–23)
CALCIUM: 8.2 mg/dL — AB (ref 8.4–10.5)
CO2: 23 mEq/L (ref 19–32)
CREATININE: 0.71 mg/dL (ref 0.50–1.10)
Chloride: 104 mEq/L (ref 96–112)
GFR calc Af Amer: >90 mL/min (ref >90)
GFR calc non Af Amer: >90 mL/min (ref >90)
Glucose, Bld: 77 mg/dL (ref 70–99)
Potassium: 3.4 mEq/L — ABNORMAL LOW (ref 3.7–5.3)
Sodium: 137 mEq/L (ref 137–147)
Total Bilirubin: 0.6 mg/dL (ref 0.3–1.2)
Total Protein: 5.7 g/dL — ABNORMAL LOW (ref 6.0–8.3)

## 2014-05-25 LAB — DRUGS OF ABUSE SCREEN W/O ALC, ROUTINE URINE
Amphetamine Screen, Ur: NEGATIVE
Barbiturate Quant, Ur: NEGATIVE
Benzodiazepines.: NEGATIVE
COCAINE METABOLITES: NEGATIVE
CREATININE, U: 200.9 mg/dL
Marijuana Metabolite: NEGATIVE
Methadone: NEGATIVE
Opiate Screen, Urine: NEGATIVE
PHENCYCLIDINE (PCP): NEGATIVE
Propoxyphene: NEGATIVE

## 2014-05-25 MED ORDER — POTASSIUM CHLORIDE CRYS ER 20 MEQ PO TBCR
40.0000 meq | EXTENDED_RELEASE_TABLET | Freq: Once | ORAL | Status: AC
  Administered 2014-05-25: 40 meq via ORAL
  Filled 2014-05-25: qty 2

## 2014-05-25 NOTE — Progress Notes (Signed)
Pt refuses to wear BP cuff or oxygen monitor. MD aware.

## 2014-05-25 NOTE — Consult Note (Signed)
Psychiatry Consult Follow Up Note  Reason for Consult:  Drug overdose and bipolar disorder Referring Physician:  Dr. [name] is an 18 y.o. female. Total Time spent with patient: 20 minutes  Assessment: AXIS I:  Bipolar, Depressed AXIS II:  Cluster B Traits AXIS III:   Past Medical History  Diagnosis Date  . Scoliosis   . MRSA (methicillin resistant staph aureus) culture positive   . Bipolar affective   . Depression   . Gonorrhea    AXIS IV:  other psychosocial or environmental problems, problems related to social environment and problems with primary support group AXIS V:  41-50 serious symptoms  Plan:  Recommend psychiatric Inpatient admission when medically cleared. Supportive therapy provided about ongoing stressors.  Subjective:   Karen Kim is a 18 y.o. female patient admitted with Drug overdose and bipolar disorder.  HPI:  Karen Kim is a 18 y.o. female admitted to [facility] ICU after she overdosed her psych medication (Abilify and Lamictal unknown amount) which she has been non compliant over two months. Most of the information for this evaluation obtained from medical records and face to face with patient mother. patient is not able to participate in the evaluation due to sedation and not able wake up with several. verbal stimuli. Patient mother found her with altered mental status. Patient has been suffering with significant symptoms of bipolar disorder with mood swings, increased goal directed activities, impulsive and meeting strangers, arguments and relationship problems with her BF and trespassing charges at [place] and intentional overdose on medication. She has history of cannabis abuse. She has multiple [facility] admissions and was received out patient medication management [facility] and her last visit is over two months ago and has no scheduled visits due to loosing her medicaid coverage. She is granduation from  high school and now no college or job. She does not listen parents but stay with her mother, step father and younger siblings. She has significant alcohol dependence in her biological father and grandma. Her dad committed suicide when she was 10 years old.   Interval History: patient seen for psych consultation follow up. Patient is awake, alert and responded verbally but closed eyes most of this visit. Patient endorses taking medication but minimizes suicide attempt at this time. Patient is not reliable historian and will follow up with her later for further assessment and now I strongly believe she needs in patient hospitalization for crisis stabilization.   Medical History: patient with a history of Bipolar disorder who was brought to the ED after a suspected overdose on her psych medications. The patient is unable to give her history at this time due to delirium, and her mother gives the history. Her Mother returned from work this evening at 7 pm and the patient was confused and not making any sense and had rambling speech and was walking around in circles. She had last been seen normal at 5 pm when her brother saw her go into her room. The patient at times interjects several different stories: 1) that her Ex-boyfriend told her to kill herself and she took the pills, 2) then another time she stated that she took the pills trying to get high and she was not trying to kill herself. Her mother reported that the patient has been off her medications for the past 1-2 months and has not seen her psychiatrist in 2 months due to no longer having insurance. Her mother drove her  to the ED. In the ED the UDS , Alcohol , Acetaminophen, and Salicylate levels were all negative initially.   Past Psychiatric History: Past Medical History  Diagnosis Date  . Scoliosis   . MRSA (methicillin resistant staph aureus) culture positive   . Bipolar affective   . Depression   . Gonorrhea     reports that she  has quit smoking. She has never used smokeless tobacco. She reports that she drinks alcohol. She reports that she uses illicit drugs (Marijuana). Family History  Problem Relation Age of Onset  . Suicidality Father     Committed suicide     Living Arrangements: Parent   Abuse/Neglect [facility]) Physical Abuse: Denies Verbal Abuse: Denies Sexual Abuse: Denies Allergies:  No Known Allergies  ACT Assessment Complete:  NO Objective: Blood pressure 99/53, pulse 84, temperature 97.7 F (36.5 C), temperature source Oral, resp. rate 20, height $RemoveBe'5\' 2"'rTgFxxTAt$  (1.575 m), weight 88.3 kg (194 lb 10.7 oz), last menstrual period 05/08/2014, SpO2 97 %.Body mass index is 35.6 kg/(m^2). Results for orders placed or performed during the hospital encounter of 05/22/14 (from the past 72 hour(s))  Blood gas, arterial     Status: Abnormal   Collection Time: 05/22/14 10:15 PM  Result Value Ref Range   FIO2 0.21 %   pH, Arterial 7.451 (H) 7.350 - 7.450   pCO2 arterial 34.8 (L) 35.0 - 45.0 mmHg   pO2, Arterial 58.1 (L) 80.0 - 100.0 mmHg   Bicarbonate 23.9 20.0 - 24.0 mEq/L   TCO2 21.2 0 - 100 mmol/L   Acid-Base Excess 0.8 0.0 - 2.0 mmol/L   O2 Saturation 91.8 %   Patient temperature 98.6    Collection site RIGHT RADIAL    Drawn by [id number]    Sample type ARTERIAL DRAW    Allens test (pass/fail) PASS PASS  CBC with Differential     Status: Abnormal   Collection Time: 05/22/14 10:21 PM  Result Value Ref Range   WBC 4.3 4.0 - 10.5 K/uL   RBC 4.30 3.87 - 5.11 MIL/uL   Hemoglobin 12.8 12.0 - 15.0 g/dL   HCT 38.5 36.0 - 46.0 %   MCV 89.5 78.0 - 100.0 fL   MCH 29.8 26.0 - 34.0 pg   MCHC 33.2 30.0 - 36.0 g/dL   RDW 14.2 11.5 - 15.5 %   Platelets 246 150 - 400 K/uL   Neutrophils Relative % 89 (H) 43 - 77 %   Neutro Abs 3.9 1.7 - 7.7 K/uL   Lymphocytes Relative 10 (L) 12 - 46 %   Lymphs Abs 0.4 (L) 0.7 - 4.0 K/uL   Monocytes Relative 1 (L) 3 - 12 %   Monocytes Absolute 0.0 (L) 0.1 - 1.0 K/uL   Eosinophils Relative  0 0 - 5 %   Eosinophils Absolute 0.0 0.0 - 0.7 K/uL   Basophils Relative 0 0 - 1 %   Basophils Absolute 0.0 0.0 - 0.1 K/uL  Comprehensive metabolic panel     Status: Abnormal   Collection Time: 05/22/14 10:21 PM  Result Value Ref Range   Sodium 137 137 - 147 mEq/L   Potassium 4.1 3.7 - 5.3 mEq/L   Chloride 100 96 - 112 mEq/L   CO2 23 19 - 32 mEq/L   Glucose, Bld 134 (H) 70 - 99 mg/dL   BUN 8 6 - 23 mg/dL   Creatinine, Ser 0.60 0.50 - 1.10 mg/dL   Calcium 9.8 8.4 - 10.5 mg/dL   Total Protein 8.1 6.0 -  8.3 g/dL   Albumin 4.0 3.5 - 5.2 g/dL   AST 15 0 - 37 U/L   ALT 9 0 - 35 U/L   Alkaline Phosphatase 57 39 - 117 U/L   Total Bilirubin 0.6 0.3 - 1.2 mg/dL   GFR calc non Af Amer >90 >90 mL/min   GFR calc Af Amer >90 >90 mL/min    Comment: (NOTE) The eGFR has been calculated using the CKD EPI equation. This calculation has not been validated in all clinical situations. eGFR's persistently <90 mL/min signify possible Chronic Kidney Disease.    Anion gap 14 5 - 15  Acetaminophen level     Status: None   Collection Time: 05/22/14 10:21 PM  Result Value Ref Range   Acetaminophen (Tylenol), Serum <15.0 10 - 30 ug/mL    Comment:        THERAPEUTIC CONCENTRATIONS VARY SIGNIFICANTLY. A RANGE OF 10-30 ug/mL MAY BE AN EFFECTIVE CONCENTRATION FOR MANY PATIENTS. HOWEVER, SOME ARE BEST TREATED AT CONCENTRATIONS OUTSIDE THIS RANGE. ACETAMINOPHEN CONCENTRATIONS >150 ug/mL AT 4 HOURS AFTER INGESTION AND >50 ug/mL AT 12 HOURS AFTER INGESTION ARE OFTEN ASSOCIATED WITH TOXIC REACTIONS.   Salicylate level     Status: Abnormal   Collection Time: 05/22/14 10:21 PM  Result Value Ref Range   Salicylate Lvl <2.4 (L) 2.8 - 20.0 mg/dL  Ethanol     Status: None   Collection Time: 05/22/14 10:21 PM  Result Value Ref Range   Alcohol, Ethyl (B) <11 0 - 11 mg/dL    Comment:        LOWEST DETECTABLE LIMIT FOR SERUM ALCOHOL IS 11 mg/dL FOR MEDICAL PURPOSES ONLY   Ammonia     Status: None    Collection Time: 05/22/14 10:22 PM  Result Value Ref Range   Ammonia 23 11 - 60 umol/L  I-Stat CG4 Lactic Acid, ED     Status: None   Collection Time: 05/22/14 10:38 PM  Result Value Ref Range   Lactic Acid, Venous 1.08 0.5 - 2.2 mmol/L  CBG monitoring, ED     Status: Abnormal   Collection Time: 05/22/14 10:39 PM  Result Value Ref Range   Glucose-Capillary 129 (H) 70 - 99 mg/dL   Comment 1 Notify RN   I-Stat beta hCG blood, ED ([facility], [facility], [facility] only)     Status: None   Collection Time: 05/22/14 10:47 PM  Result Value Ref Range   I-stat hCG, quantitative <5.0 <5 mIU/mL   Comment 3            Comment:   GEST. AGE      CONC.  (mIU/mL)   <=1 WEEK        5 - 50     2 WEEKS       50 - 500     3 WEEKS       100 - 10,000     4 WEEKS     1,000 - 30,000        FEMALE AND NON-PREGNANT FEMALE:     LESS THAN 5 mIU/mL   Urinalysis, Routine w reflex microscopic     Status: Abnormal   Collection Time: 05/22/14 10:52 PM  Result Value Ref Range   Color, Urine YELLOW YELLOW   APPearance CLEAR CLEAR   Specific Gravity, Urine 1.004 (L) 1.005 - 1.030   pH 7.0 5.0 - 8.0   Glucose, UA NEGATIVE NEGATIVE mg/dL   Hgb urine dipstick NEGATIVE NEGATIVE   Bilirubin Urine NEGATIVE NEGATIVE  Ketones, ur NEGATIVE NEGATIVE mg/dL   Protein, ur NEGATIVE NEGATIVE mg/dL   Urobilinogen, UA 0.2 0.0 - 1.0 mg/dL   Nitrite NEGATIVE NEGATIVE   Leukocytes, UA NEGATIVE NEGATIVE    Comment: MICROSCOPIC NOT DONE ON URINES WITH NEGATIVE PROTEIN, BLOOD, LEUKOCYTES, NITRITE, OR GLUCOSE <1000 mg/dL.  Drug screen panel, emergency     Status: None   Collection Time: 05/22/14 10:57 PM  Result Value Ref Range   Opiates NONE DETECTED NONE DETECTED   Cocaine NONE DETECTED NONE DETECTED   Benzodiazepines NONE DETECTED NONE DETECTED   Amphetamines NONE DETECTED NONE DETECTED   Tetrahydrocannabinol NONE DETECTED NONE DETECTED   Barbiturates NONE DETECTED NONE DETECTED    Comment:        DRUG SCREEN FOR MEDICAL PURPOSES ONLY.  IF  CONFIRMATION IS NEEDED FOR ANY PURPOSE, NOTIFY LAB WITHIN 5 DAYS.        LOWEST DETECTABLE LIMITS FOR URINE DRUG SCREEN Drug Class       Cutoff (ng/mL) Amphetamine      1000 Barbiturate      200 Benzodiazepine   170 Tricyclics       017 Opiates          300 Cocaine          300 THC              50   Basic metabolic panel     Status: Abnormal   Collection Time: 05/23/14  4:09 AM  Result Value Ref Range   Sodium 138 137 - 147 mEq/L   Potassium 3.9 3.7 - 5.3 mEq/L   Chloride 105 96 - 112 mEq/L   CO2 23 19 - 32 mEq/L   Glucose, Bld 115 (H) 70 - 99 mg/dL   BUN 6 6 - 23 mg/dL   Creatinine, Ser 0.55 0.50 - 1.10 mg/dL   Calcium 9.1 8.4 - 10.5 mg/dL   GFR calc non Af Amer >90 >90 mL/min   GFR calc Af Amer >90 >90 mL/min    Comment: (NOTE) The eGFR has been calculated using the CKD EPI equation. This calculation has not been validated in all clinical situations. eGFR's persistently <90 mL/min signify possible Chronic Kidney Disease.    Anion gap 10 5 - 15  CBC     Status: Abnormal   Collection Time: 05/23/14  4:09 AM  Result Value Ref Range   WBC 5.1 4.0 - 10.5 K/uL   RBC 3.92 3.87 - 5.11 MIL/uL   Hemoglobin 11.4 (L) 12.0 - 15.0 g/dL   HCT 34.8 (L) 36.0 - 46.0 %   MCV 88.8 78.0 - 100.0 fL   MCH 29.1 26.0 - 34.0 pg   MCHC 32.8 30.0 - 36.0 g/dL   RDW 14.4 11.5 - 15.5 %   Platelets 256 150 - 400 K/uL  MRSA PCR Screening     Status: None   Collection Time: 05/23/14  5:20 AM  Result Value Ref Range   MRSA by PCR NEGATIVE NEGATIVE    Comment:        The GeneXpert MRSA Assay (FDA approved for NASAL specimens only), is one component of a comprehensive MRSA colonization surveillance program. It is not intended to diagnose MRSA infection nor to guide or monitor treatment for MRSA infections.   CBC     Status: Abnormal   Collection Time: 05/24/14  3:45 AM  Result Value Ref Range   WBC 7.2 4.0 - 10.5 K/uL   RBC 3.76 (L) 3.87 - 5.11 MIL/uL  Hemoglobin 11.0 (L) 12.0 - 15.0  g/dL   HCT 33.3 (L) 36.0 - 46.0 %   MCV 88.6 78.0 - 100.0 fL   MCH 29.3 26.0 - 34.0 pg   MCHC 33.0 30.0 - 36.0 g/dL   RDW 14.4 11.5 - 15.5 %   Platelets 214 150 - 400 K/uL  Basic metabolic panel     Status: None   Collection Time: 05/24/14  3:45 AM  Result Value Ref Range   Sodium 139 137 - 147 mEq/L   Potassium 3.9 3.7 - 5.3 mEq/L   Chloride 104 96 - 112 mEq/L   CO2 23 19 - 32 mEq/L   Glucose, Bld 94 70 - 99 mg/dL   BUN 8 6 - 23 mg/dL   Creatinine, Ser 0.63 0.50 - 1.10 mg/dL   Calcium 9.2 8.4 - 10.5 mg/dL   GFR calc non Af Amer >90 >90 mL/min   GFR calc Af Amer >90 >90 mL/min    Comment: (NOTE) The eGFR has been calculated using the CKD EPI equation. This calculation has not been validated in all clinical situations. eGFR's persistently <90 mL/min signify possible Chronic Kidney Disease.    Anion gap 12 5 - 15  Magnesium     Status: None   Collection Time: 05/24/14  3:45 AM  Result Value Ref Range   Magnesium 1.9 1.5 - 2.5 mg/dL  Drugs of abuse screen w/o alc, routine urine (ref lab)     Status: None   Collection Time: 05/24/14  7:58 AM  Result Value Ref Range   Marijuana Metabolite NEGATIVE Negative   Amphetamine Screen, Ur NEGATIVE Negative   Barbiturate Quant, Ur NEGATIVE Negative   Methadone NEGATIVE Negative   Benzodiazepines. NEGATIVE Negative   Phencyclidine (PCP) NEGATIVE Negative   Cocaine Metabolites NEGATIVE Negative   Opiate Screen, Urine NEGATIVE Negative   Propoxyphene NEGATIVE Negative   Creatinine,U 200.9 mg/dL    Comment: (NOTE) Cutoff Values for Urine Drug Screen:        Drug Class           Cutoff (ng/mL)        Amphetamines            1000        Barbiturates             200        Cocaine Metabolites      300        Benzodiazepines          200        Methadone                300        Opiates                 2000        Phencyclidine             25        Propoxyphene             300        Marijuana Metabolites     50 For medical  purposes only. Performed at [place] metabolic panel     Status: Abnormal   Collection Time: 05/25/14  4:08 AM  Result Value Ref Range   Sodium 137 137 - 147 mEq/L   Potassium 3.4 (L) 3.7 - 5.3 mEq/L   Chloride 104 96 - 112 mEq/L   CO2 23  19 - 32 mEq/L   Glucose, Bld 77 70 - 99 mg/dL   BUN 11 6 - 23 mg/dL   Creatinine, Ser 0.71 0.50 - 1.10 mg/dL   Calcium 8.2 (L) 8.4 - 10.5 mg/dL   Total Protein 5.7 (L) 6.0 - 8.3 g/dL   Albumin 2.9 (L) 3.5 - 5.2 g/dL   AST 9 0 - 37 U/L   ALT <5 0 - 35 U/L   Alkaline Phosphatase 43 39 - 117 U/L   Total Bilirubin 0.6 0.3 - 1.2 mg/dL   GFR calc non Af Amer >90 >90 mL/min   GFR calc Af Amer >90 >90 mL/min    Comment: (NOTE) The eGFR has been calculated using the CKD EPI equation. This calculation has not been validated in all clinical situations. eGFR's persistently <90 mL/min signify possible Chronic Kidney Disease.    Anion gap 10 5 - 15   Labs are reviewed.  Current Facility-Administered Medications  Medication Dose Route Frequency Provider Last Rate Last Dose  . 0.9 %  sodium chloride infusion   Intravenous Continuous Nishant Dhungel, MD 75 mL/hr at 05/25/14 1100    . acetaminophen (TYLENOL) tablet 650 mg  650 mg Oral Q6H PRN Theressa Millard, MD       Or  . acetaminophen (TYLENOL) suppository 650 mg  650 mg Rectal Q6H PRN Theressa Millard, MD      . alum & mag hydroxide-simeth (MAALOX/MYLANTA) 200-200-20 MG/5ML suspension 30 mL  30 mL Oral Q6H PRN Theressa Millard, MD      . enoxaparin (LOVENOX) injection 40 mg  40 mg Subcutaneous Q24H Theressa Millard, MD   40 mg at 05/25/14 0908  . folic acid (FOLVITE) tablet 1 mg  1 mg Oral Daily Theodis Blaze, MD   1 mg at 05/25/14 0908  . haloperidol lactate (HALDOL) injection 1 mg  1 mg Intravenous Q6H PRN Nishant Dhungel, MD   1 mg at 05/24/14 2249  . LORazepam (ATIVAN) tablet 1 mg  1 mg Oral Q6H PRN Theodis Blaze, MD       Or  . LORazepam (ATIVAN) injection 1 mg   1 mg Intravenous Q6H PRN Theodis Blaze, MD   1 mg at 05/24/14 0415  . multivitamin with minerals tablet 1 tablet  1 tablet Oral Daily Theodis Blaze, MD   1 tablet at 05/25/14 0908  . ondansetron (ZOFRAN) tablet 4 mg  4 mg Oral Q6H PRN Theressa Millard, MD       Or  . ondansetron (ZOFRAN) injection 4 mg  4 mg Intravenous Q6H PRN Theressa Millard, MD      . oxyCODONE (Oxy IR/ROXICODONE) immediate release tablet 5 mg  5 mg Oral Q4H PRN Harvette Evonnie Dawes, MD      . sodium chloride 0.9 % bolus 500 mL  500 mL Intravenous Once Babette Relic, MD      . thiamine (VITAMIN B-1) tablet 100 mg  100 mg Oral Daily Theodis Blaze, MD   100 mg at 05/25/14 3559   Or  . thiamine (B-1) injection 100 mg  100 mg Intravenous Daily Theodis Blaze, MD   100 mg at 05/24/14 0919    Psychiatric Specialty Exam: Physical Exam as per history and physical  ROS sedation   Blood pressure 99/53, pulse 84, temperature 97.7 F (36.5 C), temperature source Oral, resp. rate 20, height $RemoveBe'5\' 2"'SnLIpOasL$  (1.575 m), weight 88.3 kg (194 lb 10.7 oz), last menstrual period  05/08/2014, SpO2 97 %.Body mass index is 35.6 kg/(m^2).  General Appearance: Disheveled and Guarded  Eye Sport and exercise psychologist::  Fair  Speech:  Slow  Volume:  Decreased  Mood:  Depressed  Affect:  Constricted and Depressed  Thought Process:  NA  Orientation:  NA  Thought Content:  NA  Suicidal Thoughts:  Yes.  with intent/plan  Homicidal Thoughts:  No  Memory:  NA  Judgement:  NA  Insight:  NA  Psychomotor Activity:  Decreased  Concentration:  NA  Recall:  NA  Fund of Knowledge:NA  Language: NA  Akathisia:  NA  Handed:  Right  AIMS (if indicated):     Assets:  Communication Skills Desire for Improvement Housing Intimacy Leisure Time [place] Talents/Skills  Sleep:      Musculoskeletal: Strength & Muscle Tone: decreased Gait & Station: unable to stand Patient leans: N/A  Treatment Plan Summary: Daily contact with patient to  assess and evaluate symptoms and progress in treatment Medication management  Continue supportive therapy Hold psych medication as she overdosed on psych medications  Jabes Primo,JANARDHAHA R. 05/25/2014 5:02 PM

## 2014-05-25 NOTE — Progress Notes (Signed)
Clinical Social Work  CSW staffed case with psych MD. Patient transferring to 5 east so CSW will follow up to complete full assessment at later time.  RangerHolly Bellamy [place], [place] [phone]

## 2014-05-25 NOTE — Progress Notes (Addendum)
[facility] PROGRESS NOTE  Karen Kim ZOX:[id number] DOB: 26-Mar-1996 DOA: 05/22/2014 PCP: No PCP Per Patient   Brief narrative 18 year old female with history of bipolar disorder and major depressive disorder brought to the ED by her mother after a suspected drug overdose possibly of her psych medications (patient's mother found empty bottle of lamictal and half empty bottle of abilify) . Patient has been admitted to behavioral [facility] previously for drug overdose. When patient's mother came back from work she found her to be confused with normal speech walking around the house agitated. She last saw her the previous evening and was normal. Patient had stopped taking her psych meds for past 3 months after she finished her school and also had stopped seeing her psychiatrist. Patient admitted to step down monitoring.  Assessment/Plan: Acute encephalopathy possibly secondary to drug overdose  It is unclear what all patient could have ingested. Her mother found empty bottle of Lamictal and half empty bottle of Abilify. Her urine drug screen on admission was positive for cannabis. [facility]. -Patient actively hallucinating and agitated on admission. On prn haldol for agitation. metnal status improving and pt not hallucinating this am.  Advance diet to regular.  -Monitor on CIWA. -continue IV fluids. -Psychiatry following. Further recommendations pending.  -transfer to tele. -replenish lytes.  Active problems Acute urinary retention Foley placed on admission. continue for now until more alert and out of bed  Hypotension  improved with fluids   Hypokalemia  replenished  Bipolar affective disorder with MDD patient stopped taking  medications for almost 3 months. Hold all meds for now  Diet: regular  DVT prophylaxis: Subcutaneous Lovenox  Code Status: Full code Family Communication: Mother at bedside Disposition  Plan: transfer to tele. Likely Needs inpatient psych facility once medically stable   Consultants:  Psychiatry  Procedures:  None  Antibiotics:  None  HPI/Subjective: Patient seen ad examined. Answering questions appropriately , although gets sleepy during conversation  Objective: Filed Vitals:   05/25/14 1200  BP:   Pulse:   Temp: 99.6 F (37.6 C)  Resp:     Intake/Output Summary (Last 24 hours) at 05/25/14 1318 Last data filed at 05/25/14 0800  Gross per 24 hour  Intake   2375 ml  Output   1075 ml  Net   1300 ml   Filed Weights   05/23/14 0400 05/24/14 0507  Weight: 85.4 kg (188 lb 4.4 oz) 88.3 kg (194 lb 10.7 oz)    Exam:   General: Young female lying in bed in no acute distress,  HEENT: dilated pupils, no pallor, poor dentition Chest: clear b/l, no added sounds,  CVS: NS1&S2, no murmurs Abd: soft, NT, ND BS+ Ext: warm, no edema CNS: sleepy but arousable and oriented.   Data Reviewed: Basic Metabolic Panel:  Recent Labs Lab 05/22/14 2221 05/23/14 0409 05/24/14 0345 05/25/14 0408  NA 137 138 139 137  K 4.1 3.9 3.9 3.4*  CL 100 105 104 104  CO2 23 23 23 23   GLUCOSE 134* 115* 94 77  BUN 8 6 8 11   CREATININE 0.60 0.55 0.63 0.71  CALCIUM 9.8 9.1 9.2 8.2*  MG  --   --  1.9  --    Liver Function Tests:  Recent Labs Lab 05/22/14 2221 05/25/14 0408  AST 15 9  ALT 9 <5  ALKPHOS 57 43  BILITOT 0.6 0.6  PROT 8.1 5.7*  ALBUMIN 4.0 2.9*   No  results for input(s): LIPASE, AMYLASE in the last 168 hours.  Recent Labs Lab 05/22/14 2222  AMMONIA 23   CBC:  Recent Labs Lab 05/22/14 2221 05/23/14 0409 05/24/14 0345  WBC 4.3 5.1 7.2  NEUTROABS 3.9  --   --   HGB 12.8 11.4* 11.0*  HCT 38.5 34.8* 33.3*  MCV 89.5 88.8 88.6  PLT 246 256 214   Cardiac Enzymes: No results for input(s): CKTOTAL, CKMB, CKMBINDEX, TROPONINI in the last 168 hours. BNP (last 3 results) No results for input(s): PROBNP in the last 8760 hours. CBG:  Recent  Labs Lab 05/22/14 2239  GLUCAP 129*    Recent Results (from the past 240 hour(s))  MRSA PCR Screening     Status: None   Collection Time: 05/23/14  5:20 AM  Result Value Ref Range Status   MRSA by PCR NEGATIVE NEGATIVE Final    Comment:        The GeneXpert MRSA Assay (FDA approved for NASAL specimens only), is one component of a comprehensive MRSA colonization surveillance program. It is not intended to diagnose MRSA infection nor to guide or monitor treatment for MRSA infections.      Studies: No results found.  Scheduled Meds: . enoxaparin (LOVENOX) injection  40 mg Subcutaneous Q24H  . folic acid  1 mg Oral Daily  . multivitamin with minerals  1 tablet Oral Daily  . sodium chloride  500 mL Intravenous Once  . thiamine  100 mg Oral Daily   Or  . thiamine  100 mg Intravenous Daily   Continuous Infusions: . sodium chloride 75 mL/hr at 05/25/14 1100      Time spent: 25 minutes    Marguerette Sheller  [facility] Pager [phone] If 7PM-7AM, please contact night-coverage at [email or web address], password [email or web address] 05/25/2014, 1:18 PM  LOS: 3 days

## 2014-05-25 NOTE — Progress Notes (Addendum)
Full report given to RN Dayle Points5E Kirsten. Pt stable for transport.

## 2014-05-26 ENCOUNTER — Encounter (HOSPITAL_COMMUNITY): Payer: Self-pay | Admitting: *Deleted

## 2014-05-26 ENCOUNTER — Inpatient Hospital Stay (HOSPITAL_COMMUNITY)
Admission: AD | Admit: 2014-05-26 | Discharge: 2014-05-31 | DRG: 885 | Disposition: A | Discharge status: Home / Self Care | Payer: Federal, State, Local not specified - Other | Source: Intra-hospital | Attending: Psychiatry | Admitting: Psychiatry

## 2014-05-26 DIAGNOSIS — F314 Bipolar disorder, current episode depressed, severe, without psychotic features: Secondary | ICD-10-CM | POA: Diagnosis present

## 2014-05-26 DIAGNOSIS — Z8614 Personal history of Methicillin resistant Staphylococcus aureus infection: Secondary | ICD-10-CM | POA: Diagnosis not present

## 2014-05-26 DIAGNOSIS — F1290 Cannabis use, unspecified, uncomplicated: Secondary | ICD-10-CM | POA: Diagnosis present

## 2014-05-26 DIAGNOSIS — Z9114 Patient's other noncompliance with medication regimen: Secondary | ICD-10-CM

## 2014-05-26 DIAGNOSIS — Z87891 Personal history of nicotine dependence: Secondary | ICD-10-CM | POA: Diagnosis not present

## 2014-05-26 DIAGNOSIS — Z79899 Other long term (current) drug therapy: Secondary | ICD-10-CM

## 2014-05-26 DIAGNOSIS — F3130 Bipolar disorder, current episode depressed, mild or moderate severity, unspecified: Secondary | ICD-10-CM | POA: Diagnosis present

## 2014-05-26 DIAGNOSIS — T50902A Poisoning by unspecified drugs, medicaments and biological substances, intentional self-harm, initial encounter: Secondary | ICD-10-CM

## 2014-05-26 DIAGNOSIS — F3113 Bipolar disorder, current episode manic without psychotic features, severe: Secondary | ICD-10-CM

## 2014-05-26 LAB — BASIC METABOLIC PANEL
ANION GAP: 10 (ref 5–15)
BUN: 9 mg/dL (ref 6–23)
CO2: 26 meq/L (ref 19–32)
Calcium: 8.4 mg/dL (ref 8.4–10.5)
Chloride: 105 mEq/L (ref 96–112)
Creatinine, Ser: 0.71 mg/dL (ref 0.50–1.10)
GFR calc Af Amer: >90 mL/min (ref >90)
GLUCOSE: 83 mg/dL (ref 70–99)
POTASSIUM: 3.7 meq/L (ref 3.7–5.3)
Sodium: 141 mEq/L (ref 137–147)

## 2014-05-26 MED ORDER — ALUM & MAG HYDROXIDE-SIMETH 200-200-20 MG/5ML PO SUSP: 30.0000 mL | ORAL | Status: DC | PRN

## 2014-05-26 MED ORDER — ACETAMINOPHEN 325 MG PO TABS: 650.0000 mg | ORAL_TABLET | Freq: Four times a day (QID) | ORAL | Status: DC | PRN

## 2014-05-26 MED ORDER — MAGNESIUM HYDROXIDE 400 MG/5ML PO SUSP: 30.0000 mL | Freq: Every day | ORAL | Status: DC | PRN

## 2014-05-26 NOTE — Progress Notes (Signed)
Patient did not attend the evening speaker NA meeting. Pt was notified that group was beginning but remained in bed.   

## 2014-05-26 NOTE — Discharge Summary (Signed)
Physician Discharge Summary  Kathyrn DrownMarissa J Alcalde ZOX:[id number] DOB: [birth year] DOA: 05/22/2014  PCP: No PCP Per Patient  Admit date: 05/22/2014 Discharge date: 05/26/2014  Recommendations for Outpatient Follow-up:  1. [facility]   Discharge Diagnoses:  Principal Problem:   Overdose Active Problems:   Bipolar affective   MDD (major depressive disorder), recurrent episode, severe   Altered mental status   Metabolic encephalopathy  Discharge Condition: Stable  Diet recommendation: Heart healthy diet discussed in details   Brief narrative 18 year old female with history of bipolar disorder and major depressive disorder brought to the ED by her mother after a suspected drug overdose possibly of her psych medications (patient's mother found empty bottle of lamictal and half empty bottle of abilify) . Patient has been admitted to behavioral health [facility] previously for drug overdose. When patient's mother came back from work she found her to be confused with normal speech walking around the house agitated. She last saw her the previous evening and was normal. Patient had stopped taking her psych meds for past 3 months after she finished her school and also had stopped seeing her psychiatrist. Patient admitted to step down monitoring.  Assessment/Plan: Acute encephalopathy possibly secondary to drug overdose It is unclear what all patient could have ingested. Her mother found empty bottle of Lamictal and half empty bottle of Abilify. Her urine drug screen on admission was positive for cannabis. [facility]. Patient actively hallucinating and agitated on admission. On prn haldol for agitation. metnal status improving and pt not hallucinating this am.  Advance diet to regular.  Psychiatry following. Recommend  Inpatient admission.   Electrolytes stable   Active problems Acute urinary retention Foley  placed on admission. Plan d/c foley prior to transfer   Hypotension Stable   Hypokalemia Supplemented   Bipolar affective disorder with MDD Patient stopped taking medications for almost 3 months.  [facility] admission when bed available   Code Status: Full code Family Communication: Mother at bedside [facility]  Consultants:  Psychiatry Procedures:  None Antibiotics:  None  Discharge Exam: Filed Vitals:   05/26/14 0638  BP: 103/68  Pulse: 64  Temp: 98.3 F (36.8 C)  Resp: 18   Filed Vitals:   05/25/14 2112 05/26/14 0206 05/26/14 0208 05/26/14 0638  BP: 119/53 83/34 90/39  103/68  Pulse: 89 78 77 64  Temp: 97.7 F (36.5 C) 98.4 F (36.9 C)  98.3 F (36.8 C)  TempSrc: Oral Oral  Oral  Resp: 22 20  18   Height:      Weight:      SpO2: 97% 95%  98%    General: Pt is alert, follows commands appropriately, not in acute distress Cardiovascular: Regular rate and rhythm, S1/S2 +, no murmurs, no rubs, no gallops Respiratory: Clear to auscultation bilaterally, no wheezing, no crackles, no rhonchi Abdominal: Soft, non tender, non distended, bowel sounds +, no guarding Extremities: no cyanosis, pulses palpable bilaterally DP and PT Neuro: Grossly nonfocal  Discharge Instructions  Discharge Instructions    Diet - low sodium heart healthy    Complete by:  As directed      Increase activity slowly    Complete by:  As directed             Medication List    STOP taking these medications        ciprofloxacin 500 MG tablet  Commonly known as:  CIPRO     predniSONE 10 MG tablet  Commonly known as:  DELTASONE      TAKE these medications        naproxen 500 MG tablet  Commonly known as:  NAPROSYN  Take 1 tablet (500 mg total) by mouth 2 (two) times daily with a meal.           Follow-up Information    Follow up with Debbora PrestoMAGICK-Chelisa Hennen, MD.   Specialty:  Internal Medicine   Why:  As needed, If symptoms worsen   Contact  information:   [address] [place] [address] [phone]        The results of significant diagnostics from this hospitalization (including imaging, microbiology, ancillary and laboratory) are listed below for reference.     Microbiology: Recent Results (from the past 240 hour(s))  MRSA PCR Screening     Status: None   Collection Time: 05/23/14  5:20 AM  Result Value Ref Range Status   MRSA by PCR NEGATIVE NEGATIVE Final    Comment:        The GeneXpert MRSA Assay (FDA approved for NASAL specimens only), is one component of a comprehensive MRSA colonization surveillance program. It is not intended to diagnose MRSA infection nor to guide or monitor treatment for MRSA infections.      Labs: Basic Metabolic Panel:  Recent Labs Lab 05/22/14 2221 05/23/14 0409 05/24/14 0345 05/25/14 0408 05/26/14 0514  NA 137 138 139 137 141  K 4.1 3.9 3.9 3.4* 3.7  CL 100 105 104 104 105  CO2 23 23 23 23 26   GLUCOSE 134* 115* 94 77 83  BUN 8 6 8 11 9   CREATININE 0.60 0.55 0.63 0.71 0.71  CALCIUM 9.8 9.1 9.2 8.2* 8.4  MG  --   --  1.9  --   --    Liver Function Tests:  Recent Labs Lab 05/22/14 2221 05/25/14 0408  AST 15 9  ALT 9 <5  ALKPHOS 57 43  BILITOT 0.6 0.6  PROT 8.1 5.7*  ALBUMIN 4.0 2.9*    Recent Labs Lab 05/22/14 2222  AMMONIA 23   CBC:  Recent Labs Lab 05/22/14 2221 05/23/14 0409 05/24/14 0345  WBC 4.3 5.1 7.2  NEUTROABS 3.9  --   --   HGB 12.8 11.4* 11.0*  HCT 38.5 34.8* 33.3*  MCV 89.5 88.8 88.6  PLT 246 256 214   CBG:  Recent Labs Lab 05/22/14 2239  GLUCAP 129*   SIGNED: Time coordinating discharge: Over 30 minutes  Debbora PrestoMAGICK-Bren Steers, MD  [facility] 05/26/2014, 10:31 AM Pager [phone]  If 7PM-7AM, please contact night-coverage [email or web address] Password [email or web address]

## 2014-05-26 NOTE — Consult Note (Signed)
Psychiatry Consult Follow Up Note  Reason for Consult:  Drug overdose and bipolar disorder Referring Physician:  Dr. [name] is an 18 y.o. female. Total Time spent with patient: 20 minutes  Assessment: AXIS I:  Bipolar, Depressed AXIS II:  Cluster B Traits AXIS III:   Past Medical History  Diagnosis Date  . Scoliosis   . MRSA (methicillin resistant staph aureus) culture positive   . Bipolar affective   . Depression   . Gonorrhea    AXIS IV:  other psychosocial or environmental problems, problems related to social environment and problems with primary support group AXIS V:  41-50 serious symptoms  Plan:  Spoke with [facility] and she has bed available today Spoke with Sindy Messing, LCSW and will be completing IVC petition Recommend psychiatric Inpatient admission when medically cleared. Supportive therapy provided about ongoing stressors.  Appreciate psychiatric consultation Please contact [phone] or [phone] if needs further assistance   Subjective:   Karen Kim is a 18 y.o. female patient admitted with Drug overdose and bipolar disorder.  HPI:  Karen Kim is a 18 y.o. female admitted to [facility] ICU after she overdosed her psych medication (Abilify and Lamictal unknown amount) which she has been non compliant over two months. Most of the information for this evaluation obtained from medical records and face to face with patient mother. patient is not able to participate in the evaluation due to sedation and not able wake up with several. verbal stimuli. Patient mother found her with altered mental status. Patient has been suffering with significant symptoms of bipolar disorder with mood swings, increased goal directed activities, impulsive and meeting strangers, arguments and relationship problems with her BF and trespassing charges at [place] and intentional overdose on medication. She has history of cannabis abuse. She has multiple [facility] admissions and  was received out [facility] and her last visit is over two months ago and has no scheduled visits due to loosing her medicaid coverage. She is granduation from high school and now no college or job. She does not listen parents but stay with her mother, step father and younger siblings. She has significant alcohol dependence in her biological father and grandma. Her dad committed suicide when she was 30 years old.   Interval History: Patient has been depressed, tearful and minimizes her symptoms of bipolar disorder and stated that she was upset prior to intention overdose of her psychotropic medications and denied suicide intent or medication can cause any danger to her self. She is some what unreliable and unable to understand the severity of mental illness and suicide behavior and not able to contract for safety.   Patient is awake, alert and poorly cooperative during this visit. Patient endorses taking medication but minimizes suicide attempt at this time. I strongly believe she needs in patient hospitalization for crisis stabilization. Patient aunt who is at bed side encourage her to be admitted and treated. She become emotional and asking to go home.   Past Psychiatric History: Past Medical History  Diagnosis Date  . Scoliosis   . MRSA (methicillin resistant staph aureus) culture positive   . Bipolar affective   . Depression   . Gonorrhea     reports that she has quit smoking. She has never used smokeless tobacco. She reports that she drinks alcohol. She reports that she uses illicit drugs (Marijuana). Family History  Problem Relation Age of Onset  .  Suicidality Father     Committed suicide     Living Arrangements: Parent   Abuse/Neglect [facility]) Physical Abuse: Denies Verbal Abuse: Denies Sexual Abuse: Denies Allergies:  No Known Allergies  ACT Assessment Complete:  NO Objective: Blood pressure 103/68, pulse 64,  temperature 98.3 F (36.8 C), temperature source Oral, resp. rate 18, height _0  (1.575 m), weight 88.3 kg (194 lb 10.7 oz), last menstrual period 05/08/2014, SpO2 98 %.Body mass index is 35.6 kg/(m^2). Results for orders placed or performed during the hospital encounter of 05/22/14 (from the past 72 hour(s))  CBC     Status: Abnormal   Collection Time: 05/24/14  3:45 AM  Result Value Ref Range   WBC 7.2 4.0 - 10.5 K/uL   RBC 3.76 (L) 3.87 - 5.11 MIL/uL   Hemoglobin 11.0 (L) 12.0 - 15.0 g/dL   HCT 33.3 (L) 36.0 - 46.0 %   MCV 88.6 78.0 - 100.0 fL   MCH 29.3 26.0 - 34.0 pg   MCHC 33.0 30.0 - 36.0 g/dL   RDW 14.4 11.5 - 15.5 %   Platelets 214 150 - 400 K/uL  Basic metabolic panel     Status: None   Collection Time: 05/24/14  3:45 AM  Result Value Ref Range   Sodium 139 137 - 147 mEq/L   Potassium 3.9 3.7 - 5.3 mEq/L   Chloride 104 96 - 112 mEq/L   CO2 23 19 - 32 mEq/L   Glucose, Bld 94 70 - 99 mg/dL   BUN 8 6 - 23 mg/dL   Creatinine, Ser 0.63 0.50 - 1.10 mg/dL   Calcium 9.2 8.4 - 10.5 mg/dL   GFR calc non Af Amer >90 >90 mL/min   GFR calc Af Amer >90 >90 mL/min    Comment: (NOTE) The eGFR has been calculated using the CKD EPI equation. This calculation has not been validated in all clinical situations. eGFR's persistently <90 mL/min signify possible Chronic Kidney Disease.    Anion gap 12 5 - 15  Magnesium     Status: None   Collection Time: 05/24/14  3:45 AM  Result Value Ref Range   Magnesium 1.9 1.5 - 2.5 mg/dL  Drugs of abuse screen w/o alc, routine urine (ref lab)     Status: None   Collection Time: 05/24/14  7:58 AM  Result Value Ref Range   Marijuana Metabolite NEGATIVE Negative   Amphetamine Screen, Ur NEGATIVE Negative   Barbiturate Quant, Ur NEGATIVE Negative   Methadone NEGATIVE Negative   Benzodiazepines. NEGATIVE Negative   Phencyclidine (PCP) NEGATIVE Negative   Cocaine Metabolites NEGATIVE Negative   Opiate Screen, Urine NEGATIVE Negative    Propoxyphene NEGATIVE Negative   Creatinine,U 200.9 mg/dL    Comment: (NOTE) Cutoff Values for Urine Drug Screen:        Drug Class           Cutoff (ng/mL)        Amphetamines            1000        Barbiturates             200        Cocaine Metabolites      300        Benzodiazepines          200        Methadone                300  Opiates                 2000        Phencyclidine             25        Propoxyphene             300        Marijuana Metabolites     50 For medical purposes only. Performed at [facility]   Comprehensive metabolic panel     Status: Abnormal   Collection Time: 05/25/14  4:08 AM  Result Value Ref Range   Sodium 137 137 - 147 mEq/L   Potassium 3.4 (L) 3.7 - 5.3 mEq/L   Chloride 104 96 - 112 mEq/L   CO2 23 19 - 32 mEq/L   Glucose, Bld 77 70 - 99 mg/dL   BUN 11 6 - 23 mg/dL   Creatinine, Ser 0.71 0.50 - 1.10 mg/dL   Calcium 8.2 (L) 8.4 - 10.5 mg/dL   Total Protein 5.7 (L) 6.0 - 8.3 g/dL   Albumin 2.9 (L) 3.5 - 5.2 g/dL   AST 9 0 - 37 U/L   ALT <5 0 - 35 U/L   Alkaline Phosphatase 43 39 - 117 U/L   Total Bilirubin 0.6 0.3 - 1.2 mg/dL   GFR calc non Af Amer >90 >90 mL/min   GFR calc Af Amer >90 >90 mL/min    Comment: (NOTE) The eGFR has been calculated using the CKD EPI equation. This calculation has not been validated in all clinical situations. eGFR's persistently <90 mL/min signify possible Chronic Kidney Disease.    Anion gap 10 5 - 15  Basic metabolic panel     Status: None   Collection Time: 05/26/14  5:14 AM  Result Value Ref Range   Sodium 141 137 - 147 mEq/L   Potassium 3.7 3.7 - 5.3 mEq/L   Chloride 105 96 - 112 mEq/L   CO2 26 19 - 32 mEq/L   Glucose, Bld 83 70 - 99 mg/dL   BUN 9 6 - 23 mg/dL   Creatinine, Ser 0.71 0.50 - 1.10 mg/dL   Calcium 8.4 8.4 - 10.5 mg/dL   GFR calc non Af Amer >90 >90 mL/min   GFR calc Af Amer >90 >90 mL/min    Comment: (NOTE) The eGFR has been calculated using the CKD EPI  equation. This calculation has not been validated in all clinical situations. eGFR's persistently <90 mL/min signify possible Chronic Kidney Disease.    Anion gap 10 5 - 15   Labs are reviewed.  Current Facility-Administered Medications  Medication Dose Route Frequency Provider Last Rate Last Dose  . 0.9 %  sodium chloride infusion   Intravenous Continuous Nishant Dhungel, MD 75 mL/hr at 05/26/14 0058    . acetaminophen (TYLENOL) tablet 650 mg  650 mg Oral Q6H PRN Theressa Millard, MD       Or  . acetaminophen (TYLENOL) suppository 650 mg  650 mg Rectal Q6H PRN Theressa Millard, MD      . alum & mag hydroxide-simeth (MAALOX/MYLANTA) 200-200-20 MG/5ML suspension 30 mL  30 mL Oral Q6H PRN Theressa Millard, MD      . enoxaparin (LOVENOX) injection 40 mg  40 mg Subcutaneous Q24H Theressa Millard, MD   40 mg at 05/26/14 0949  . folic acid (FOLVITE) tablet 1 mg  1 mg Oral Daily Theodis Blaze, MD   1 mg at 05/26/14  8185  . haloperidol lactate (HALDOL) injection 1 mg  1 mg Intravenous Q6H PRN Nishant Dhungel, MD   1 mg at 05/25/14 2128  . multivitamin with minerals tablet 1 tablet  1 tablet Oral Daily Theodis Blaze, MD   1 tablet at 05/26/14 [phone]  . ondansetron (ZOFRAN) tablet 4 mg  4 mg Oral Q6H PRN Theressa Millard, MD       Or  . ondansetron (ZOFRAN) injection 4 mg  4 mg Intravenous Q6H PRN Theressa Millard, MD      . oxyCODONE (Oxy IR/ROXICODONE) immediate release tablet 5 mg  5 mg Oral Q4H PRN Harvette Evonnie Dawes, MD      . sodium chloride 0.9 % bolus 500 mL  500 mL Intravenous Once Babette Relic, MD      . thiamine (VITAMIN B-1) tablet 100 mg  100 mg Oral Daily Theodis Blaze, MD   100 mg at 05/26/14 9702   Or  . thiamine (B-1) injection 100 mg  100 mg Intravenous Daily Theodis Blaze, MD   100 mg at 05/24/14 0919    Psychiatric Specialty Exam: Physical Exam as per history and physical  ROS sedation   Blood pressure 103/68, pulse 64, temperature 98.3 F (36.8 C), temperature  source Oral, resp. rate 18, height _0  (1.575 m), weight 88.3 kg (194 lb 10.7 oz), last menstrual period 05/08/2014, SpO2 98 %.Body mass index is 35.6 kg/(m^2).  General Appearance: Disheveled and Guarded  Eye Sport and exercise psychologist::  Fair  Speech:  Slow  Volume:  Decreased  Mood:  Depressed  Affect:  Constricted and Depressed  Thought Process:  NA  Orientation:  NA  Thought Content:  NA  Suicidal Thoughts:  Yes.  with intent/plan  Homicidal Thoughts:  No  Memory:  NA  Judgement:  NA  Insight:  NA  Psychomotor Activity:  Decreased  Concentration:  NA  Recall:  NA  Fund of Knowledge:NA  Language: NA  Akathisia:  NA  Handed:  Right  AIMS (if indicated):     Assets:  Communication Skills Desire for Improvement Housing Intimacy Leisure Time [place] Talents/Skills  Sleep:      Musculoskeletal: Strength & Muscle Tone: decreased Gait & Station: unable to stand Patient leans: N/A  Treatment Plan Summary: Daily contact with patient to assess and evaluate symptoms and progress in treatment Medication management  Continue supportive therapy and hold psych medication as she overdosed on psych medications  Azie Mcconahy,JANARDHAHA R. 05/26/2014 10:52 AM

## 2014-05-26 NOTE — Discharge Instructions (Signed)
Suicidal Feelings, How to Help Yourself °Everyone feels sad or unhappy at times, but depressing thoughts and feelings of hopelessness can lead to thoughts of suicide. It can seem as if life is too tough to handle. If you feel as though you have reached the point where suicide is the only answer, it is time to let someone know immediately.  °HOW TO COPE AND PREVENT SUICIDE °· Let family, friends, teachers, or counselors know. Get help. Try not to isolate yourself from those who care about you. Even though you may not feel sociable, talk with someone every day. It is best if it is face-to-face. Remember, they will want to help you. °· Eat a regularly spaced and well-balanced diet. °· Get plenty of rest. °· Avoid alcohol and drugs because they will only make you feel worse and may also lower your inhibitions. Remove them from the home. If you are thinking of taking an overdose of your prescribed medicines, give your medicines to someone who can give them to you one day at a time. If you are on antidepressants, let your caregiver know of your feelings so he or she can provide a safer medicine, if that is a concern. °· Remove weapons or poisons from your home. °· Try to stick to routines. Follow a schedule and remind yourself that you have to keep that schedule every day. °· Set some realistic goals and achieve them. Make a list and cross things off as you go. Accomplishments give a sense of worth. Wait until you are feeling better before doing things you find difficult or unpleasant to do. °· If you are able, try to start exercising. Even half-hour periods of exercise each day will make you feel better. Getting out in the sun or into nature helps you recover from depression faster. If you have a favorite place to walk, take advantage of that. °· Increase safe activities that have always given you pleasure. This may include playing your favorite music, reading a good book, painting a picture, or playing your favorite  instrument. Do whatever takes your mind off your depression. °· Keep your living space well-lighted. °GET HELP °Contact a suicide hotline, crisis center, or local suicide prevention center for help right away. Local centers may include a hospital, clinic, community service organization, social service provider, or health department. °· Call your local emergency services (911 in the [place]). °· Call a suicide hotline: °¨ 1-800-273-TALK (1-800-273-8255) in the [place]. °¨ 1-800-SUICIDE (1-800-784-2433) in the [place]. °¨ 1-888-628-9454 in the [place] for Spanish-speaking counselors. °¨ 1-800-799-4TTY (1-800-799-4889) in the [place] for TTY users. °· Visit the following websites for information and help: °¨ National Suicide Prevention Lifeline: www.suicidepreventionlifeline.org °¨ Hopeline: www.hopeline.com °¨ American Foundation for Suicide Prevention: www.afsp.org °· For lesbian, gay, bisexual, transgender, or questioning youth, contact The Trevor Project: °¨ 1-866-4-U-TREVOR (1-866-488-7386) in the [place]. °¨ www.thetrevorproject.org °· In [place], treatment resources are listed in each province with listings available under [employer] or similar titles. Another source for Crisis Centres by Province is located at http://www.suicideprevention.ca/in-crisis-now/find-a-crisis-centre-now/crisis-centres °Document Released: 12/23/2002 Document Revised: 09/10/2011 Document Reviewed: 10/13/2013 °ExitCare® Patient Information ©2015 [employer]. This information is not intended to replace advice given to you by your health care provider. Make sure you discuss any questions you have with your health care provider. ° °

## 2014-05-26 NOTE — Progress Notes (Signed)
Called report to [place] HillsLindsey, Charity fundraiserN [facility]. Awaiting transportation.

## 2014-05-26 NOTE — Progress Notes (Signed)
Patient ID: Karen DrownMarissa J Kim, female   DOB: 12-27-1995, 18 y.o.   MRN: [id number] Admission Note-[facility] after she had overdosed on an undisclosed amount of Lamictal and Abilify. She was hospitalized 4 days prior to [facility]. She is irritable, and poorly cooperative with the admission process, whining and complaining about the entire process. She states she was never trying to overdose but rather trying to get high. When writer questioned her choice in using antidepressants to get high she stated she didn't care if i didn't believe her. Poor eye contact. She states she is angry about being here because the Dr was going to release her until her mom told him things that werent true and now she is here. States she sees a therapist but doesn't know the name or where he works and that her mom takes her. She states she has been seeing him for several months weekly.States she will not take any medications while here, and will refuse all food. She denies being suicidal and states she never was. She denies wanting to hurt others.She denies being psychotic but acknowledges that her mom says she was when she was admitted, states there is a lot she doesn't remember about her admission. She denies any drug or alcohol use.She has no property on admission. [facility] and says it doesn't help and will talk to the Dr. About going home.

## 2014-05-26 NOTE — Progress Notes (Signed)
Pt left the unit in stable condition acompained by [facility], [facility]

## 2014-05-26 NOTE — Progress Notes (Signed)
D: Pt denies SI/HI/AVH. Pt is pleasant and cooperative. Pt lying in bed on approach. Pt remained in room all night, pt did not attend evening group. Pt forwards little information, but stated she was doing ok. Pt stated she stopped taking her medications due to her feeling like they weren't working.   A: Pt was offered support and encouragement. Pt was given scheduled medications. Pt was encourage to attend groups. Q 15 minute checks were done for safety.   R: Pt is taking medication. Pt has no complaints at this time .Pt receptive to treatment and safety maintained on unit.

## 2014-05-26 NOTE — Progress Notes (Signed)
Clinical Social Work Department BRIEF PSYCHOSOCIAL ASSESSMENT 05/26/2014  Patient:  Karen Kim,Karen Kim     Account Number:  [id number]     Admit date:  05/22/2014  Clinical Social Worker:  Dennison BullaGERBER,Josely Moffat, LCSW  Date/Time:  05/26/2014 02:30 PM  Referred by:  Physician  Date Referred:  05/26/2014 Referred for  Psychosocial assessment   Other Referral:   Interview type:  Patient Other interview type:    PSYCHOSOCIAL DATA Living Status:  FAMILY Admitted from facility:   Level of care:   Primary support name:  Angie Primary support relationship to patient:  PARENT Degree of support available:   Strong    CURRENT CONCERNS Current Concerns  Behavioral Health Issues   Other Concerns:    SOCIAL WORK ASSESSMENT / PLAN CSW received referral in order to assist with inpatient psych placement. CSW rounded with psych MD to evaluate patient. Psych MD recommending inpatient placement. [phone] RN to call report to [phone].    Psych MD informed patient [facility] when aunt present in room. Patient upset and reports she wants to DC home and will follow up on outpatient basis. Psych MD explained that due to safety concerns, patient would be unable to DC home. Patient unwilling to sign voluntary to [facility] so psych MD recommended IVC.    CSW faxed IVC forms to Magistrate and called to confirm that information was received. Magistrate reports that patient will be served and transported.   Assessment/plan status:  No Further Intervention Required Other assessment/ plan:   Information/referral to community resources:   [facility]    PATIENT'S/FAMILY'S RESPONSE TO PLAN OF CARE: Patient alert and oriented. Patient angry and guarded during assessment. Patient reports she wanted to be at home with her family for the holidays and denies that she was trying to kill herself. Patient is unable to give any further explanation to why she overdosed on  medication and reports no consistent follow up in over 2 months. Patient [facility] and [facility] status. CSW offered to call family but patient reports she does not want CSW to contact anyone on her behalf.     [place] Rosa ValleyHolly Shabria Egley, [place]CS[place] [phone]

## 2014-05-26 NOTE — Plan of Care (Signed)
Problem: Discharge Progression Outcomes Goal: Discharge plan in place and appropriate Outcome: Completed/Met Date Met:  05/26/14 Goal: Pain controlled with appropriate interventions Outcome: Completed/Met Date Met:  05/26/14 Goal: Hemodynamically stable Outcome: Completed/Met Date Met:  88/91/69 Goal: Complications resolved/controlled Outcome: Completed/Met Date Met:  05/26/14 Goal: Tolerating diet Outcome: Completed/Met Date Met:  05/26/14 Goal: Activity appropriate for discharge plan Outcome: Completed/Met Date Met:  05/26/14

## 2014-05-27 DIAGNOSIS — F314 Bipolar disorder, current episode depressed, severe, without psychotic features: Principal | ICD-10-CM

## 2014-05-27 NOTE — Progress Notes (Signed)
D: Pt slept majority of this morning. Cooperative with nursing assessment when she woke up. Denied pain, A /V hallucinations at time of assessment. Verbally contracted for safety while on unit. A: Emotional support and availability offered. R: Pt showered. Ate lunch in her room. Visiting quietly with mother at this time.

## 2014-05-27 NOTE — Plan of Care (Signed)
Problem: Alteration in mood Goal: STG-Patient reports thoughts of self-harm to staff Outcome: Progressing Pt denied thoughts of self harm when assessed. However, she's withdrawned to her room,  Depressed mood / sad/flat affect with minimal interaction towards others thus far this shift.

## 2014-05-27 NOTE — Plan of Care (Signed)
Problem: Ineffective individual coping Goal: STG: Patient will remain free from self harm Outcome: Progressing AEB assessment

## 2014-05-27 NOTE — [facility] Suicide Risk Assessment (Signed)
   Nursing information obtained from:  Patient Demographic factors:  Adolescent or young adult, Caucasian Current Mental Status:  NA Loss Factors:  NA Historical Factors:  Family history of suicide, Impulsivity Risk Reduction Factors:  Living with another person, especially a relative, Positive social support Total Time spent with patient: 1 hour  CLINICAL FACTORS:   Bipolar Disorder:   Depressive phase Depression:   Hopelessness Impulsivity Severe Previous Psychiatric Diagnoses and Treatments  Psychiatric Specialty Exam: Physical Exam  ROS  Blood pressure 96/52, pulse 99, temperature 97 F (36.1 C), temperature source Oral, resp. rate 18, height 5' (1.524 m), weight 87.091 kg (192 lb), last menstrual period 05/08/2014.Body mass index is 37.5 kg/(m^2).  General Appearance: Guarded  Eye Contact::  Fair  Speech:  Pressured  Volume:  Increased  Mood:  Depressed, Irritable and Worthless  Affect:  Constricted and Depressed  Thought Process:  Intact  Orientation:  Full (Time, Place, and Person)  Thought Content:  Rumination  Suicidal Thoughts:  Yes.  with intent/plan  Homicidal Thoughts:  No  Memory:  Immediate;   Fair Recent;   Fair Remote;   Poor  Judgement:  Impaired  Insight:  Lacking  Psychomotor Activity:  Increased  Concentration:  Poor  Recall:  Fair  Fund of Knowledge:Fair  Language: Fair  Akathisia:  No  Handed:  Right  AIMS (if indicated):     Assets:  Manufacturing systems engineerCommunication Skills Social Support  Sleep:  Number of Hours: 6.5   Musculoskeletal: Strength & Muscle Tone: within normal limits Gait & Station: normal Patient leans: N/A  COGNITIVE FEATURES THAT CONTRIBUTE TO RISK:  Polarized thinking    SUICIDE RISK:   Severe:  Frequent, intense, and enduring suicidal ideation, specific plan, no subjective intent, but some objective markers of intent (i.e., choice of lethal method), the method is accessible, some limited preparatory behavior, evidence of impaired  self-control, severe dysphoria/symptomatology, multiple risk factors present, and few if any protective factors, particularly a lack of social support.  PLAN OF CARE:  I certify that inpatient services furnished can reasonably be expected to improve the patient's condition.  Makinzi Prieur T. 05/27/2014, 11:19 AM

## 2014-05-27 NOTE — H&P (Signed)
Psychiatric Admission Assessment Adult  Patient Identification:  Karen Kim  Date of Evaluation:  05/27/2014  Chief Complaint:  BIPOLAR DISORDER,MOST RECENT EPISODE,DEPRESSED   History of Present Illness: Karen Kim is 18 years old, Caucasian female. She reports, "6 days ago, I passed out. I took some antidepressant pills. I was trying to get high, but they did not work, I passed out instead. I was not depressed, and I'm still not depressed. I feel fine. Can I go home now? I have a therapist at home. I can get counseling at home, if I want it. I don't want to attend these group sessions here. If I don't attend groups here, what would happen, will I get discharged? I'm not planning on coming out of my room. I don't need any medicines and I sleep fine. I don't do drugs or use alcohol".  O: Mellissa appears nonchalant about her mental illness. She has poor insight about her mental health. She is threatening to not participate in group sessions so she can be discharged because of her non-participant".   Elements:  Location:  Bipolar affective disorder. Quality:  Denies any signs/symptoms of depression. Severity:  Denies any level of depression/anxiety. Timing:  Says, took some pills to get high, rather, passed out instead. Duration:  Happened 6 days ago. Context:  "I just took some antidepressants to get high, they did not work, I passed out instead and woke up at the hospital".  Associated Signs/Synptoms:  Depression Symptoms:  Denies any sign/symptoms of depression  (Hypo) Manic Symptoms:  Impulsivity,  Anxiety Symptoms:  Denies  Psychotic Symptoms:  Denies  PTSD Symptoms: Denies  Total Time spent with patient: 1 hour  Psychiatric Specialty Exam: Physical Exam  Constitutional: She is oriented to person, place, and time. She appears well-developed and well-nourished.  Obese  HENT:  Head: Normocephalic.  Eyes: Pupils are equal, round, and reactive to light.  Neck: Normal  range of motion.  Cardiovascular: Normal rate and regular rhythm.   Respiratory: Effort normal and breath sounds normal.  GI: Soft. Bowel sounds are normal.  Genitourinary:  Denies any issues in this area  Musculoskeletal: Normal range of motion.  Neurological: She is alert and oriented to person, place, and time.  Skin: Skin is warm and dry.  Psychiatric: Her speech is normal. Thought content normal. Her mood appears not anxious (Denies). Her affect is angry, blunt and inappropriate. She is withdrawn. Agitated: (Irritated) Cognition and memory are normal. She expresses inappropriate judgment. She does not exhibit a depressed mood (Denies).    Review of Systems  Constitutional: Negative.   HENT: Negative.   Eyes: Negative.   Respiratory: Negative.   Cardiovascular: Negative.   Gastrointestinal: Negative.   Genitourinary: Negative.   Musculoskeletal: Negative.   Skin: Negative.   Neurological: Negative.   Endo/Heme/Allergies: Negative.   Psychiatric/Behavioral: Positive for depression (Hx of). Negative for suicidal ideas, hallucinations, memory loss and substance abuse. The patient is not nervous/anxious and does not have insomnia.     Blood pressure 96/52, pulse 99, temperature 97 F (36.1 C), temperature source Oral, resp. rate 18, height 5' (1.524 m), weight 87.091 kg (192 lb), last menstrual period 05/08/2014.Body mass index is 37.5 kg/(m^2).  General Appearance: Fairly Groomed and Obese  Eye Contact::  Fair  Speech:  Clear and Coherent  Volume:  Normal  Mood:  Depressed  Affect:  Restricted  Thought Process:  Intact  Orientation:  Full (Time, Place, and Person)  Thought Content:  Denies any hallucinations, delusions and  or paranoia  Suicidal Thoughts:  No  Homicidal Thoughts:  No  Memory:  Immediate;   Good Recent;   Good Remote;   Good  Judgement:  Impaired  Insight:  Lacking  Psychomotor Activity:  Decreased  Concentration:  Poor  Recall:  Good  Fund of  Knowledge:Poor  Language: Fair  Akathisia:  No  Handed:  Right  AIMS (if indicated):     Assets:  Desire for Improvement  Sleep:  Number of Hours: 6.5   Musculoskeletal: Strength & Muscle Tone: within normal limits Gait & Station: normal Patient leans: N/A  Past Psychiatric History: Diagnosis:   Hospitalizations: Bipolar affective disorder, depressed  Outpatient Care: None reported  Substance Abuse Care: None  Self-Mutilation: Denies  Suicidal Attempts: Denies  Violent Behaviors: Denies   Past Medical History:   Past Medical History  Diagnosis Date  . Scoliosis   . MRSA (methicillin resistant staph aureus) culture positive   . Bipolar affective   . Depression   . Gonorrhea    None.  Allergies:  No Known Allergies  PTA Medications: No prescriptions prior to admission   Previous Psychotropic Medications: Medication/Dose  "I can't remember my previous meds"               Substance Abuse History in the last 12 months:  Yes.    Consequences of Substance Abuse: Medical Consequences:  Liver damage, Possible death by overdose Legal Consequences:  Arrests, jail time, Loss of driving privilege. Family Consequences:  Family discord, divorce and or separation.  Social History:  reports that she has quit smoking. She has never used smokeless tobacco. She reports that she drinks alcohol. She reports that she uses illicit drugs (Marijuana). Additional Social History: Pain Medications: not abusing but states overdose was not intended to die but to get "high" Prescriptions: refuses to take any meds, denies abusing them History of alcohol / drug use?: No history of alcohol / drug abuse  Current Place of Residence: [place], [place] of Birth: [place], [place]  Family Members: "My mother"  Marital Status:  Single  Children: 0  Sons:  Daughters:  Relationships: Single  Education:  [employer] Charity fundraiser Problems/Performance: Completed high  school  Religious Beliefs/Practices: NA  History of Abuse (Emotional/Phsycial/Sexual): Denies  Occupational Experiences: Medical laboratory scientific officer History:  None.  Legal History: Denies any pending legal charges  Hobbies/Interests: NA  Family History:   Family History  Problem Relation Age of Onset  . Suicidality Father     Committed suicide    Results for orders placed or performed during the hospital encounter of 05/22/14 (from the past 72 hour(s))  Comprehensive metabolic panel     Status: Abnormal   Collection Time: 05/25/14  4:08 AM  Result Value Ref Range   Sodium 137 137 - 147 mEq/L   Potassium 3.4 (L) 3.7 - 5.3 mEq/L   Chloride 104 96 - 112 mEq/L   CO2 23 19 - 32 mEq/L   Glucose, Bld 77 70 - 99 mg/dL   BUN 11 6 - 23 mg/dL   Creatinine, Ser 0.71 0.50 - 1.10 mg/dL   Calcium 8.2 (L) 8.4 - 10.5 mg/dL   Total Protein 5.7 (L) 6.0 - 8.3 g/dL   Albumin 2.9 (L) 3.5 - 5.2 g/dL   AST 9 0 - 37 U/L   ALT <5 0 - 35 U/L   Alkaline Phosphatase 43 39 - 117 U/L   Total Bilirubin 0.6 0.3 - 1.2 mg/dL   GFR  calc non Af Amer >90 >90 mL/min   GFR calc Af Amer >90 >90 mL/min    Comment: (NOTE) The eGFR has been calculated using the CKD EPI equation. This calculation has not been validated in all clinical situations. eGFR's persistently <90 mL/min signify possible Chronic Kidney Disease.    Anion gap 10 5 - 15  Basic metabolic panel     Status: None   Collection Time: 05/26/14  5:14 AM  Result Value Ref Range   Sodium 141 137 - 147 mEq/L   Potassium 3.7 3.7 - 5.3 mEq/L   Chloride 105 96 - 112 mEq/L   CO2 26 19 - 32 mEq/L   Glucose, Bld 83 70 - 99 mg/dL   BUN 9 6 - 23 mg/dL   Creatinine, Ser 0.71 0.50 - 1.10 mg/dL   Calcium 8.4 8.4 - 10.5 mg/dL   GFR calc non Af Amer >90 >90 mL/min   GFR calc Af Amer >90 >90 mL/min    Comment: (NOTE) The eGFR has been calculated using the CKD EPI equation. This calculation has not been validated in all clinical situations. eGFR's persistently  <90 mL/min signify possible Chronic Kidney Disease.    Anion gap 10 5 - 15   Psychological Evaluations:  Assessment:   DSM5: Schizophrenia Disorders:  NA Obsessive-Compulsive Disorders:  NA Trauma-Stressor Disorders:  NA Substance/Addictive Disorders:  NA Depressive Disorders: Bipolar affective disorder, depressed, severe  AXIS I:  Bipolar affective disorder, depressed, severe AXIS II:  Deferred AXIS III:   Past Medical History  Diagnosis Date  . Scoliosis   . MRSA (methicillin resistant staph aureus) culture positive   . Bipolar affective   . Depression   . Gonorrhea    AXIS IV:  other psychosocial or environmental problems and mental illness, chronic AXIS V:  11-20 some danger of hurting self or others possible OR occasionally fails to maintain minimal personal hygiene OR gross impairment in communication  Treatment Plan/Recommendations: 1. Admit for crisis management and stabilization, estimated length of stay 3-5 days.  2. Medication management to reduce current symptoms to base line and improve the patient's overall level of functioning; Mellissa declines to be on any medicines at this time.  3. Treat health problems as indicated.  4. Develop treatment plan to decrease risk of relapse upon discharge and the need for readmission.  5. Psycho-social education regarding relapse prevention and self care.  6. Health care follow up as needed for medical problems.  7. Review, reconcile, and reinstate any pertinent home medications for other health issues where appropriate. 8. Call for consults with hospitalist for any additional specialty patient care services as needed.  Treatment Plan Summary: Daily contact with patient to assess and evaluate symptoms and progress in treatment Medication management  Current Medications:  Current Facility-Administered Medications  Medication Dose Route Frequency Provider Last Rate Last Dose  . acetaminophen (TYLENOL) tablet 650 mg  650 mg  Oral Q6H PRN Kennedy Bucker, NP      . alum & mag hydroxide-simeth (MAALOX/MYLANTA) 200-200-20 MG/5ML suspension 30 mL  30 mL Oral Q4H PRN Kennedy Bucker, NP      . magnesium hydroxide (MILK OF MAGNESIA) suspension 30 mL  30 mL Oral Daily PRN Kennedy Bucker, NP        Observation Level/Precautions:  15 minute checks  Laboratory:  Per ED  Psychotherapy: Group sessions   Medications:  See medication lists  Consultations: As needed  Discharge Concerns: Safety  Estimated LOS: 2-4 days  Other:  I certify that inpatient services furnished can reasonably be expected to improve the patient's condition.   Lindell Spar I, PMHNP-BC 11/26/201512:29 PM I have personally seen the patient and agreed with the findings and involved in the treatment plan. Berniece Andreas, MD

## 2014-05-27 NOTE — Progress Notes (Signed)
Adult Psychoeducational Group Note (orientation Group)  Date:  05/27/2014 Time:  0900  Group Topic/Focus:  Orientation:   The focus of this group is to educate the patient on the purpose and policies of crisis stabilization and provide a format to answer questions about their admission.  The group details unit policies and expectations of patients while admitted.  Participation Level:  None  Participation Quality:  Pt did not attend group as scheduled.  Affect:  Depressed  Cognitive:  Alert  Insight: Appropriate  Engagement in Group:  Pt did not attend group as scheduled despite multiple prompts from staff.   Modes of Intervention:  Discussion  Additional Comments: Pt did not attend group as scheduled despite multiple prompts from staff.   Sherryl MangesWesseh, Maddisyn Hegwood 05/27/2014, 3:34 PM

## 2014-05-28 DIAGNOSIS — F250 Schizoaffective disorder, bipolar type: Secondary | ICD-10-CM

## 2014-05-28 NOTE — Progress Notes (Signed)
[facility]  05/28/2014 9:55 AM Kathyrn DrownMarissa J Worth  MRN:  [id number]  Subjective: Karen Kim reports, "I don't know what I'm doing here. I need to be discharged".  O: Alena says she is does not know the reason for her admission. She says she is not suicidal, rather was trying to get high and took some pill to just get high. She is trying to participate in group sessions. She is in no apparent distress.  Diagnosis:   DSM5: Schizophrenia Disorders:  NA Obsessive-Compulsive Disorders:  NA Trauma-Stressor Disorders:  NA Substance/Addictive Disorders:  NA Depressive Disorders: Bipolar 1 affective disorder Total Time spent with patient: 30 minutes  Axis I: Bipolar affective disorder Axis II: Deferred Axis III:  Past Medical History  Diagnosis Date  . Scoliosis   . MRSA (methicillin resistant staph aureus) culture positive   . Bipolar affective   . Depression   . Gonorrhea    Axis IV: other psychosocial or environmental problems and mental illness, chronic Axis V: 61-70 mild symptoms  ADL's:  Fair  Sleep: Good  Appetite:  Good  Suicidal Ideation:  Plan:  Denies Intent:  denies Means:  Denies Homicidal Ideation:  Plan:  Denies Intent:  Denies Means:  Denies AEB (as evidenced by):  Psychiatric Specialty Exam: Physical Exam  Psychiatric: Her speech is normal and behavior is normal. Thought content normal. Her mood appears not anxious. Her affect is not angry, not blunt, not labile and not inappropriate. Cognition and memory are normal. She expresses impulsivity. She does not exhibit a depressed mood.    Review of Systems  Constitutional: Negative.   HENT: Negative.   Eyes: Negative.   Respiratory: Negative.   Cardiovascular: Negative.   Gastrointestinal: Negative.   Genitourinary: Negative.   Musculoskeletal: Negative.   Skin: Negative.   Neurological: Negative.   Endo/Heme/Allergies: Negative.   Psychiatric/Behavioral: Negative for depression, suicidal  ideas, hallucinations, memory loss and substance abuse. The patient is not nervous/anxious and does not have insomnia.     Blood pressure 96/52, pulse 99, temperature 97 F (36.1 C), temperature source Oral, resp. rate 18, height 5' (1.524 m), weight 87.091 kg (192 lb), last menstrual period 05/08/2014.Body mass index is 37.5 kg/(m^2).  General Appearance: Casual  Eye Contact::  Fair  Speech:  Clear and Coherent  Volume:  Decreased  Mood:  Denies feeling depressed or anxious  Affect:  Restricted  Thought Process:  Coherent  Orientation:  Full (Time, Place, and Person)  Thought Content:  Rumination  Suicidal Thoughts:  No  Homicidal Thoughts:  No  Memory:  Immediate;   Good Recent;   Good Remote;   Good  Judgement:  Impaired  Insight:  Lacking  Psychomotor Activity:  Normal  Concentration:  Good  Recall:  Good  Fund of Knowledge:Fair  Language: Fair  Akathisia:  No  Handed:  Right  AIMS (if indicated):     Assets:  Desire for Improvement  Sleep:  Number of Hours: 6.5   Musculoskeletal: Strength & Muscle Tone: within normal limits Gait & Station: normal Patient leans: N/A  Current Medications: Current Facility-Administered Medications  Medication Dose Route Frequency Provider Last Rate Last Dose  . acetaminophen (TYLENOL) tablet 650 mg  650 mg Oral Q6H PRN Bonnetta BarryShelly Eisbach, NP      . alum & mag hydroxide-simeth (MAALOX/MYLANTA) 200-200-20 MG/5ML suspension 30 mL  30 mL Oral Q4H PRN Bonnetta BarryShelly Eisbach, NP      . magnesium hydroxide (MILK OF MAGNESIA) suspension 30 mL  30 mL  Oral Daily PRN Bonnetta BarryShelly Eisbach, NP        Lab Results: No results found for this or any previous visit (from the past 48 hour(s)).  Physical Findings: AIMS: Facial and Oral Movements Muscles of Facial Expression: None, normal Lips and Perioral Area: None, normal Jaw: None, normal Tongue: None, normal,Extremity Movements Upper (arms, wrists, hands, fingers): None, normal Lower (legs, knees, ankles, toes):  None, normal, Trunk Movements Neck, shoulders, hips: None, normal, Overall Severity Severity of abnormal movements (highest score from questions above): None, normal Incapacitation due to abnormal movements: None, normal Patient's awareness of abnormal movements (rate only patient's report): No Awareness, Dental Status Current problems with teeth and/or dentures?: No Does patient usually wear dentures?: No  CIWA:    COWS:     Treatment Plan Summary: Daily contact with patient to assess and evaluate symptoms and progress in treatment Medication management  Plan: Continue to monitor changes in mood. Declines to be on any medications. Discharge disposition in progress.  Medical Decision Making Problem Points:  Established problem, stable/improving (1), Review of last therapy session (1) and Review of psycho-social stressors (1) Data Points:  Review of medication regiment & side effects (2) Review of new medications or change in dosage (2)  I certify that inpatient services furnished can reasonably be expected to improve the patient's condition.   Armandina Stammerwoko, Agnes I, PMHNP-BC 05/28/2014, 9:55 AM I agreed with the findings, treatment and disposition plan of this patient. Kathryne SharperSyed Azar South, MD

## 2014-05-28 NOTE — Progress Notes (Signed)
D: Pt denies SI/HI/AVH. Pt was minimal on the unit. Pt stayed to room all night.   A: Pt was offered support and encouragement. Pt was given scheduled medications. Pt was encourage to attend groups. Q 15 minute checks were done for safety.   R:Pt attends groups and interacts well with peers and staff. Pt is taking medication. Pt receptive to treatment and safety maintained on unit.

## 2014-05-28 NOTE — Progress Notes (Signed)
D: Pt denies SI/HI/AV. Pt is pleasant and cooperative. Pt plans to take time from school when she leaves. Pt wants to find a job and try to get back in the swing soon.   A: Pt was offered support and encouragement. Pt was given scheduled medications. Pt was encourage to attend groups. Q 15 minute checks were done for safety.   R:Pt attends groups . Pt is taking medication. Pt has no complaints at  This time .Pt receptive to treatment and safety maintained on unit.

## 2014-05-28 NOTE — Progress Notes (Deleted)
Pt on 1:1 for safety fall risk.  Pt has been asleep and refused her am medications. Pt was angry and asking to "go home" pt was upset that the [facility] was going to bathroom while pt voided and having her bowel movements and wanted to know why. Discussed with pt that she had reported a couple of unwitnessed falls that this was for her safety and would be in her treatment plan until we could assure she would could be safe on her own.  She finally voiced understanding. She remains on 1:1 for safety.

## 2014-05-28 NOTE — Plan of Care (Signed)
Problem: Diagnosis: Increased Risk For Suicide Attempt Goal: LTG-Patient Will Report Improved Mood and Deny Suicidal LTG (by discharge) Patient will report improved mood and deny suicidal ideation.  Outcome: Progressing Pt denies SI Goal: LTG-Patient Will Report Absence of Withdrawal Symptoms LTG (by discharge): Patient will report absence of withdrawal symptoms.  Outcome: Completed/Met Date Met:  05/28/14 Pt has not complaints of withdrawal Sx
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
## 2014-05-28 NOTE — [facility] Group Notes (Signed)
[facility] LCSW Aftercare Discharge Planning Group Note  05/28/2014  8:45 AM  Participation Quality: Did Not Attend- patient in bed.  Samuella BruinKristin Bernadetta Roell, MSW, [employer]CSWA Clinical Social Worker [facility] [phone]

## 2014-05-29 MED ORDER — LAMOTRIGINE 25 MG PO TABS
25.0000 mg | ORAL_TABLET | Freq: Every day | ORAL | Status: DC
  Administered 2014-05-29 – 2014-05-31 (×3): 25 mg via ORAL
  Filled 2014-05-29 (×4): qty 1
  Filled 2014-05-29: qty 14

## 2014-05-29 MED ORDER — HYDROXYZINE HCL 50 MG PO TABS
50.0000 mg | ORAL_TABLET | Freq: Once | ORAL | Status: AC
  Administered 2014-05-29: 50 mg via ORAL

## 2014-05-29 MED ORDER — HYDROXYZINE HCL 25 MG PO TABS
25.0000 mg | ORAL_TABLET | ORAL | Status: DC | PRN
  Administered 2014-05-30: 25 mg via ORAL
  Filled 2014-05-29: qty 1
  Filled 2014-05-29: qty 30
  Filled 2014-05-29 (×2): qty 1

## 2014-05-29 NOTE — [facility] Group Notes (Signed)
0900 nursing orientation group     The focus of this group is to educate the patient on the purpose and policies of crisis stabilization and provide a format to answer questions about their admission.  The group details unit policies and expectations of patients while admitted.   Pt did not attend she was asleep in her bed 

## 2014-05-29 NOTE — Progress Notes (Signed)
D: Pt denies SI/HI/AVH. Pt is pleasant and cooperative. Pt stated happier, felt better because she had someone to talk to. Pt continues to blame others and appears to not take responsibility for her actions.   A: Pt was offered support and encouragement. Pt was given scheduled medications. Pt was encourage to attend groups. Q 15 minute checks were done for safety. Pt educated on Bi-polar diagnosis.   R:Pt attends groups and interacts well with peers and staff. Pt is taking medication. Pt has no complaints at this time .Pt receptive to treatment and safety maintained on unit.

## 2014-05-29 NOTE — Progress Notes (Signed)
Patient ID: Karen DrownMarissa J Gaffin, female   DOB: 1995/11/19, 18 y.o.   MRN: [id number] [facility] Progress Note  05/29/2014 11:44 AM Karen Kim  MRN:  [id number]  Subjective: Comfort reports, " I need to be discharged, I know why I'm here, but I don't want to be here. This place is making me irritated and angry. I'm not depressed or suicidal. I took some pills to see how it feels to be high. I was not trying to kill myself. I will be better off being at home and have one on one counseling with my therapist".  O: Tenecia is seen with the MD present. Chart reviewed. Harly is visibly upset, tearful and agitated. She is counseled on her impulsivity. She says she will try medication and will continue to attend group sessions just to get out of this hospital. She states that she does not like the group sessions because every other patient in the group is older that she is, and has nothing in common to talk about with them. This is at least the third admission for Thresia in this hospital, all related to impulsive acts.  Diagnosis:   DSM5: Schizophrenia Disorders:  NA Obsessive-Compulsive Disorders:  NA Trauma-Stressor Disorders:  NA Substance/Addictive Disorders:  NA Depressive Disorders: Bipolar 1 affective disorder Total Time spent with patient: 30 minutes  Axis I: Bipolar affective disorder Axis II: Deferred Axis III:  Past Medical History  Diagnosis Date  . Scoliosis   . MRSA (methicillin resistant staph aureus) culture positive   . Bipolar affective   . Depression   . Gonorrhea    Axis IV: other psychosocial or environmental problems and mental illness, chronic Axis V: 61-70 mild symptoms  ADL's:  Fair  Sleep: Good  Appetite:  Good  Suicidal Ideation:  Plan:  Denies Intent:  denies Means:  Denies Homicidal Ideation:  Plan:  Denies Intent:  Denies Means:  Denies AEB (as evidenced by):  Psychiatric Specialty Exam: Physical Exam  Psychiatric: Her speech is normal and  behavior is normal. Thought content normal. Her mood appears not anxious. Her affect is not angry, not blunt, not labile and not inappropriate. Cognition and memory are normal. She expresses impulsivity. She does not exhibit a depressed mood.    Review of Systems  Constitutional: Negative.   HENT: Negative.   Eyes: Negative.   Respiratory: Negative.   Cardiovascular: Negative.   Gastrointestinal: Negative.   Genitourinary: Negative.   Musculoskeletal: Negative.   Skin: Negative.   Neurological: Negative.   Endo/Heme/Allergies: Negative.   Psychiatric/Behavioral: Negative for depression, suicidal ideas, hallucinations, memory loss and substance abuse. The patient is not nervous/anxious and does not have insomnia.     Blood pressure 90/51, pulse 64, temperature 98.6 F (37 C), temperature source Oral, resp. rate 18, height 5' (1.524 m), weight 87.091 kg (192 lb), last menstrual period 05/08/2014.Body mass index is 37.5 kg/(m^2).  General Appearance: Casual  Eye Contact::  Fair  Speech:  Clear and Coherent  Volume:  Decreased  Mood:  Denies feeling depressed, but obviously agitated/angry  Affect:  Tearful  Thought Process:  Coherent  Orientation:  Full (Time, Place, and Person)  Thought Content:  Rumination  Suicidal Thoughts:  No  Homicidal Thoughts:  No  Memory:  Immediate;   Good Recent;   Good Remote;   Good  Judgement:  Impaired  Insight:  Lacking  Psychomotor Activity:  Restlessness and agitated, angry  Concentration:  Good  Recall:  Good  Fund of Knowledge:Fair  Language:  Fair  Akathisia:  No  Handed:  Right  AIMS (if indicated):     Assets:  Desire for Improvement  Sleep:  Number of Hours: 6.75   Musculoskeletal: Strength & Muscle Tone: within normal limits Gait & Station: normal Patient leans: N/A  Current Medications: Current Facility-Administered Medications  Medication Dose Route Frequency Provider Last Rate Last Dose  . acetaminophen (TYLENOL) tablet 650  mg  650 mg Oral Q6H PRN Bonnetta BarryShelly Eisbach, NP      . alum & mag hydroxide-simeth (MAALOX/MYLANTA) 200-200-20 MG/5ML suspension 30 mL  30 mL Oral Q4H PRN Bonnetta BarryShelly Eisbach, NP      . hydrOXYzine (ATARAX/VISTARIL) tablet 25 mg  25 mg Oral Q4H PRN Sanjuana KavaAgnes I Nwoko, NP      . lamoTRIgine (LAMICTAL) tablet 25 mg  25 mg Oral Daily Sanjuana KavaAgnes I Nwoko, NP      . magnesium hydroxide (MILK OF MAGNESIA) suspension 30 mL  30 mL Oral Daily PRN Bonnetta BarryShelly Eisbach, NP        Lab Results: No results found for this or any previous visit (from the past 48 hour(s)).  Physical Findings: AIMS: Facial and Oral Movements Muscles of Facial Expression: None, normal Lips and Perioral Area: None, normal Jaw: None, normal Tongue: None, normal,Extremity Movements Upper (arms, wrists, hands, fingers): None, normal Lower (legs, knees, ankles, toes): None, normal, Trunk Movements Neck, shoulders, hips: None, normal, Overall Severity Severity of abnormal movements (highest score from questions above): None, normal Incapacitation due to abnormal movements: None, normal Patient's awareness of abnormal movements (rate only patient's report): No Awareness, Dental Status Current problems with teeth and/or dentures?: No Does patient usually wear dentures?: No  CIWA:    COWS:     Treatment Plan Summary: Daily contact with patient to assess and evaluate symptoms and progress in treatment Medication management  Plan: Initiate Lamictal 25 mg daily for mood stabilization, monitor for adverse effects. Add Hydroxyzine 50 mg once, and 25 mg Q 6 hours prn for anxiety/tension. Continue to monitor changes in mood. Has agreed to try Lamictal at 25 mg daily for mood stabilization.. Continue current treatment plan.  Medical Decision Making Problem Points:  Established problem, stable/improving (1), Review of last therapy session (1) and Review of psycho-social stressors (1) Data Points:  Review of medication regiment & side effects (2) Review of  new medications or change in dosage (2)  I certify that inpatient services furnished can reasonably be expected to improve the patient's condition.   Armandina Stammerwoko, Agnes I, PMHNP-BC 05/29/2014, 11:44 AM   I agreed with the findings, treatment and disposition plan of this patient. Kathryne SharperSyed Manahil Vanzile, MD

## 2014-05-29 NOTE — Progress Notes (Signed)
Pt did attend group this evening.  

## 2014-05-29 NOTE — Progress Notes (Signed)
Pt was in bed this morning and around 1030 she wanted to discuss her discharge. Pt denied any depression or hopelessness but did admit to anxiety a 5 on her self-inventory.  She denied any S/H ideation or A/V/H.  She was angry, tearful and mad blaming everyone from her mother to the doctor and staff here for her currently having to be here.  She finally wrote her goal "to get out of here" when asked what she would need to do to be ready for discharge she stated,"there is no point in me answering that question since I am not getting out of here today".  She continued to be labile and was able to talk with a staff member.  Few moments later Dr. Lolly MustacheArfeen and Chandra BatchAggie N. NP spoke with the patient and she agreed to start lamictal for her mood and some vistaril for her anxiety.  From that point on today, pt has continued to be up in the milieu talking with peers and staff.

## 2014-05-29 NOTE — [facility] Group Notes (Signed)
[facility] LCSW Group Therapy 05/29/2014 10:59 AM  Type of Therapy and Topic: Group Therapy: Avoiding Self-Sabotaging and Enabling Behaviors   Pt did not attend for unknown reason.   Chad CordialLauren Carter, LCSWA 05/29/2014 10:59 AM

## 2014-05-29 NOTE — Progress Notes (Signed)
The patient arrived late for the A. A. Meeting but sat appropriately for the duration of the group.

## 2014-05-30 NOTE — [facility] Group Notes (Signed)
The focus of this group is to educate the patient on the purpose and policies of crisis stabilization and provide a format to answer questions about their admission.  The group details unit policies and expectations of patients while admitted.  Patient did not attend 0900 nurse education orientation group this morning.  Patient stayed in bed sleeping.    

## 2014-05-30 NOTE — Progress Notes (Signed)
D:  Patient's self inventory sheet, patient slept good last night, no sleep medication given.  Fair appetite, low energy level, good concentration.  Denied depression and hopeless.  Rated anxiety 3.  Denied withdrawals.  Denied SI.   Denied physical problems.  Goal today is to go home.  Plans to attend groups.  Does have discharge plans.  No problems anticipated after discharge. A:  Medications administered per MD orders.  Emotional support and encouragement given patient. R:  Denied SI and HI, contracts for safety.  Denied A/V hallucinations.  Safety maintained with 15 minute checks.

## 2014-05-30 NOTE — Plan of Care (Signed)
Problem: Consults Goal: Suicide Risk Patient Education (See Patient Education module for education specifics)  Outcome: Completed/Met Date Met:  05/30/14 Nurse discussed suicidal thoughts with patient.     

## 2014-05-30 NOTE — [facility] Group Notes (Signed)
[facility] LCSW Group Therapy  05/30/2014 10:00 AM   Type of Therapy:  Group Therapy  Participation Level:  Did Not Attend  Tenya Araque Horton, LCSW 05/30/2014 10:32 AM   

## 2014-05-30 NOTE — Progress Notes (Signed)
Patient ID: Karen Kim, female   DOB: [birth year], 18 y.o.   MRN: [id number] Patient ID: Karen Kim, female   DOB: [birth year], 18 y.o.   MRN: [id number] [facility] MD Progress Note  05/30/2014 3:17 PM Karen Kim  MRN:  [id number]  Subjective: Karen Kim says she is doing well with improved mood. Looking forward to going home soon.  O: Karen Kim is seen. Chart reviewed. Karen Kim is visibly on the unit. She is cheerful, well groomed and active in group sessions. She says this place is not so bad after all. She adds that she is doing all she can to not come back to this hospital again because she feels restricted. She was started on Lamictal yesterday, tolerating it without problems.  Diagnosis:   DSM5: Schizophrenia Disorders:  NA Obsessive-Compulsive Disorders:  NA Trauma-Stressor Disorders:  NA Substance/Addictive Disorders:  NA Depressive Disorders: Bipolar 1 affective disorder Total Time spent with patient: 30 minutes  Axis I: Bipolar affective disorder Axis II: Deferred Axis III:  Past Medical History  Diagnosis Date  . Scoliosis   . MRSA (methicillin resistant staph aureus) culture positive   . Bipolar affective   . Depression   . Gonorrhea    Axis IV: other psychosocial or environmental problems and mental illness, chronic Axis V: 61-70 mild symptoms  ADL's:  Fair  Sleep: Good  Appetite:  Good  Suicidal Ideation:  Plan:  Denies Intent:  denies Means:  Denies Homicidal Ideation:  Plan:  Denies Intent:  Denies Means:  Denies AEB (as evidenced by):  Psychiatric Specialty Exam: Physical Exam  Psychiatric: Her speech is normal and behavior is normal. Thought content normal. Her mood appears not anxious. Her affect is not angry, not blunt, not labile and not inappropriate. Cognition and memory are normal. She expresses impulsivity. She does not exhibit a depressed mood.    Review of Systems  Constitutional: Negative.   HENT: Negative.   Eyes: Negative.    Respiratory: Negative.   Cardiovascular: Negative.   Gastrointestinal: Negative.   Genitourinary: Negative.   Musculoskeletal: Negative.   Skin: Negative.   Neurological: Negative.   Endo/Heme/Allergies: Negative.   Psychiatric/Behavioral: Negative for depression, suicidal ideas, hallucinations, memory loss and substance abuse. The patient is not nervous/anxious and does not have insomnia.     Blood pressure 105/48, pulse 54, temperature 98.1 F (36.7 C), temperature source Oral, resp. rate 18, height 5' (1.524 m), weight 87.091 kg (192 lb), last menstrual period 05/08/2014.Body mass index is 37.5 kg/(m^2).  General Appearance: Casual  Eye Contact::  Fair  Speech:  Clear and Coherent  Volume:  Decreased  Mood:  Improving  Affect:  Tearful  Thought Process:  Coherent  Orientation:  Full (Time, Place, and Person)  Thought Content:  Rumination  Suicidal Thoughts:  No  Homicidal Thoughts:  No  Memory:  Immediate;   Good Recent;   Good Remote;   Good  Judgement:  Impaired  Insight:  Lacking  Psychomotor Activity:  Normal  Concentration:  Good  Recall:  Good  Fund of Knowledge:Fair  Language: Fair  Akathisia:  No  Handed:  Right  AIMS (if indicated):     Assets:  Desire for Improvement  Sleep:  Number of Hours: 6.75   Musculoskeletal: Strength & Muscle Tone: within normal limits Gait & Station: normal Patient leans: N/A  Current Medications: Current Facility-Administered Medications  Medication Dose Route Frequency Provider Last Rate Last Dose  . acetaminophen (TYLENOL) tablet 650 mg  650 mg Oral Q6H PRN  Bonnetta BarryShelly Eisbach, NP      . alum & mag hydroxide-simeth (MAALOX/MYLANTA) 200-200-20 MG/5ML suspension 30 mL  30 mL Oral Q4H PRN Bonnetta BarryShelly Eisbach, NP      . hydrOXYzine (ATARAX/VISTARIL) tablet 25 mg  25 mg Oral Q4H PRN Sanjuana KavaAgnes I Nwoko, NP   25 mg at 05/30/14 1459  . lamoTRIgine (LAMICTAL) tablet 25 mg  25 mg Oral Daily Sanjuana KavaAgnes I Nwoko, NP   25 mg at 05/30/14 0853  . magnesium  hydroxide (MILK OF MAGNESIA) suspension 30 mL  30 mL Oral Daily PRN Bonnetta BarryShelly Eisbach, NP        Lab Results: No results found for this or any previous visit (from the past 48 hour(s)).  Physical Findings: AIMS: Facial and Oral Movements Muscles of Facial Expression: None, normal Lips and Perioral Area: None, normal Jaw: None, normal Tongue: None, normal,Extremity Movements Upper (arms, wrists, hands, fingers): None, normal Lower (legs, knees, ankles, toes): None, normal, Trunk Movements Neck, shoulders, hips: None, normal, Overall Severity Severity of abnormal movements (highest score from questions above): None, normal Incapacitation due to abnormal movements: None, normal Patient's awareness of abnormal movements (rate only patient's report): No Awareness, Dental Status Current problems with teeth and/or dentures?: No Does patient usually wear dentures?: No  CIWA:    COWS:     Treatment Plan Summary: Daily contact with patient to assess and evaluate symptoms and progress in treatment Medication management  Plan: Continue Lamictal 25 mg daily for mood stabilization, monitor for adverse effects. continue Hydroxyzine 50 mg once, and 25 mg Q 6 hours prn for anxiety/tension. Continue to monitor changes in mood. Continue current treatment plan.  Medical Decision Making Problem Points:  Established problem, stable/improving (1), Review of last therapy session (1) and Review of psycho-social stressors (1) Data Points:  Review of medication regiment & side effects (2) Review of new medications or change in dosage (2)  I certify that inpatient services furnished can reasonably be expected to improve the patient's condition.   Armandina Stammerwoko, Agnes I, PMHNP-BC 05/30/2014, 3:17 PM  I agreed with the findings, treatment and disposition plan of this patient. Kathryne SharperSyed Trong Gosling, MD

## 2014-05-31 MED ORDER — LAMOTRIGINE 25 MG PO TABS: 25.0000 mg | ORAL_TABLET | Freq: Every day | ORAL | Status: DC

## 2014-05-31 MED ORDER — HYDROXYZINE HCL 25 MG PO TABS: 25.0000 mg | ORAL_TABLET | ORAL | Status: DC | PRN

## 2014-05-31 NOTE — Progress Notes (Signed)
The patient attended the A.A. Meeting last evening.

## 2014-05-31 NOTE — Discharge Summary (Signed)
Physician Discharge Summary Note  Patient:  Karen Kim is an 18 y.o., female MRN:  [id number] DOB:  [birth year] Patient phone:  [phone] (home)  Patient address:   [address] [place] [address],  Total Time spent with patient: Greater than 30 minutes  Date of Admission:  05/26/2014 Date of Discharge: 05/31/14  Reason for Admission: Mood stabilization treatment  Discharge Diagnoses: Active Problems:   Bipolar 1 disorder, depressed, severe   Psychiatric Specialty Exam: Physical Exam  Psychiatric: Her speech is normal and behavior is normal. Judgment and thought content normal. Her mood appears not anxious. Her affect is not angry, not blunt, not labile and not inappropriate. Cognition and memory are normal. She does not exhibit a depressed mood.    Review of Systems  Constitutional: Negative.   HENT: Negative.   Eyes: Negative.   Respiratory: Negative.   Cardiovascular: Negative.   Gastrointestinal: Negative.   Genitourinary: Negative.   Musculoskeletal: Negative.   Skin: Negative.   Neurological: Negative.   Endo/Heme/Allergies: Negative.   Psychiatric/Behavioral: Positive for depression (Stable) and substance abuse (Hx of). Negative for suicidal ideas, hallucinations and memory loss. The patient is not nervous/anxious and does not have insomnia.     Blood pressure 128/72, pulse 80, temperature 98.4 F (36.9 C), temperature source Oral, resp. rate 16, height 5' (1.524 m), weight 87.091 kg (192 lb), last menstrual period 05/08/2014.Body mass index is 37.5 kg/(m^2).  See Md's SAR.                                                 Past Psychiatric History: Diagnosis:   Hospitalizations: Bipolar affective disorder, depressed  Outpatient Care: None reported  Substance Abuse Care: None  Self-Mutilation: Denies  Suicidal Attempts: Denies  Violent Behaviors: Denies   Musculoskeletal: Strength & Muscle Tone: within normal  limits Gait & Station: normal Patient leans: N/A  DSM5: Schizophrenia Disorders:  NA Obsessive-Compulsive Disorders:  NA Trauma-Stressor Disorders:  NA Substance/Addictive Disorders:  Drug abuse Depressive Disorders:  Bipolar affective disorder, depressed, severe  Axis Diagnosis:  AXIS I:  Bipolar 1 disorder, depressed, severe AXIS II:  Deferred AXIS III:   Past Medical History  Diagnosis Date  . Scoliosis   . MRSA (methicillin resistant staph aureus) culture positive   . Bipolar affective   . Depression   . Gonorrhea    AXIS IV:  other psychosocial or environmental problems and metal illness, chronic AXIS V:  63  Level of Care:  OP  Hospital Course:  Karen Kim is 18 years old, Caucasian female. She reports, "6 days ago, I passed out. I took some antidepressant pills. I was trying to get high, but they did not work, I passed out instead. I was not depressed, and I'm still not depressed. I feel fine. Can I go home now? I have a therapist at home. I can get counseling at home, if I want it. I don't want to attend these group sessions here. If I don't attend groups here, what would happen, will I get discharged? I'm not planning on coming out of my room. I don't need any medicines and I sleep fine. I don't do drugs or use alcohol".  Karen Kim was admitted to the hospital for mood stabilization treatment due to an apparent drug overdose. However, Karen Kim maintained throughout her hospital stay that her overdose was not a  suicide attempt and or gesture, rather her desire to get high. She maintained that she was not depressed and or suicidal and initially declined to be on any medication and or participate in group counseling sessions. Karen Kim was a patient in this hospital last July with the exact same problems, she reported an attempt to get high that resulted in overdose incident. Again she denied suicide attempt and or thought. She also maintained not having depression.  Karen Kim was  explained the possible consequences of her impulse/erratic behavior of drug abuse. She was explained the need for medication management and counseling sessions as she is likely suffering from an underlying serious mental illness .She finally agreed to a trial of Lamictal. She got involved with the group counseling sessions and seem to have benefited from the combination therapies.  Karen Kim symptoms responded well to her treatment regimens. This is evidence by reports of improved mood, absence of suicidal ideations, hallucinations, delusional thoughts and or paranoia. She is currently being discharged to continue psychiatric treatment and medication management at the [facility] [place], [place]. She is provided with all the pertinent information required to make this appointment without problems. She received a 14 days worth, supply samples of her [facility] medications. [facility] all personal belongings in no apparent distress. Transportation per patient arrangements.  Consults:  psychiatry  Significant Diagnostic Studies:  labs: CBC with diff, CMP, UDS, toxicology tests, U/A, reports reviewed, no changes  Discharge Vitals:   Blood pressure 128/72, pulse 80, temperature 98.4 F (36.9 C), temperature source Oral, resp. rate 16, height 5' (1.524 m), weight 87.091 kg (192 lb), last menstrual period 05/08/2014. Body mass index is 37.5 kg/(m^2). Lab Results:   No results found for this or any previous visit (from the past 72 hour(s)).  Physical Findings: AIMS: Facial and Oral Movements Muscles of Facial Expression: None, normal Lips and Perioral Area: None, normal Jaw: None, normal Tongue: None, normal,Extremity Movements Upper (arms, wrists, hands, fingers): None, normal Lower (legs, knees, ankles, toes): None, normal, Trunk Movements Neck, shoulders, hips: None, normal, Overall Severity Severity of abnormal movements (highest score from questions above): None,  normal Incapacitation due to abnormal movements: None, normal Patient's awareness of abnormal movements (rate only patient's report): No Awareness, Dental Status Current problems with teeth and/or dentures?: No Does patient usually wear dentures?: No  CIWA:  CIWA-Ar Total: 1 COWS:  COWS Total Score: 1  Psychiatric Specialty Exam: See Psychiatric Specialty Exam and Suicide Risk Assessment completed by Attending Physician prior to discharge.  Discharge destination:  Home  Is patient on multiple antipsychotic therapies at discharge:  No   Has Patient had three or more failed trials of antipsychotic monotherapy by history:  No  Recommended Plan for Multiple Antipsychotic Therapies: NA    Medication List    TAKE these medications      Indication   hydrOXYzine 25 MG tablet  Commonly known as:  ATARAX/VISTARIL  Take 1 tablet (25 mg total) by mouth every 4 (four) hours as needed for anxiety (Sleep).   Indication:  Tension, Anxiety/sleep     lamoTRIgine 25 MG tablet  Commonly known as:  LAMICTAL  Take 1 tablet (25 mg total) by mouth daily. For mood stabilization   Indication:  Mood stabilization       Follow-up Information    [facility] On 05[facility]31/2014.   Specialty:  Behavioral Health   Why:  Please present to [facility] on this date or any day Monday-Friday 8am  to 3pm for assessment for therapy and medication management services.   [address] [place] [address] [phone]     Follow-up recommendations: Activity:  As tolerated Diet: As recommended by your primary care doctor. Keep all scheduled follow-up appointments as recommended.   Comments:  Take all your medications as prescribed by your mental healthcare provider. Report any adverse effects and or reactions from your medicines to your outpatient provider promptly. Patient is instructed and cautioned to not engage in alcohol and or illegal drug use while on prescription  medicines. In the event of worsening symptoms, patient is instructed to call the crisis hotline, 911 and or go to the nearest ED for appropriate evaluation and treatment of symptoms. Follow-up with your primary care provider for your other medical issues, concerns and or health care needs.   Total Discharge Time:  Greater than 30 minutes.  Signed: [name], PMHNP-BC 05/31/2014, 3:28 PM   Patient seen, Suicide Assessment Completed.  Disposition Plan Reviewed

## 2014-05-31 NOTE — Progress Notes (Signed)
Pt was discharged home today. She denied any S/I H/I or A/V hallucinations.  She was given f/u appointment, rx, sample medications, and a hotline info bookle. She voiced understanding to all instructions provided. She declined the need for smoking cessation materials.

## 2014-05-31 NOTE — Progress Notes (Signed)
Patient ID: Karen DrownMarissa J Kim, female   DOB: 1995-09-16, 18 y.o.   MRN: [id number] D- Patient alert and oriented. Patient has an anxious mood and affect. Denies SI, HI, AVH, and pain. Patient rated her anxiety 3/10. No complaints at this time.  A- Support and encouragement provided.  Patient encouraged to attend all groups and activities provided. Routine safety checks conducted every 15 minutes.  Patient informed to notify staff with problems or concerns. R- Patient contracts for safety at this time.  Patient receptive, calm, and cooperative.

## 2014-05-31 NOTE — [facility] Group Notes (Signed)
   [facility] LCSW Aftercare Discharge Planning Group Note  05/31/2014  8:45 AM   Participation Quality: Alert, Appropriate and Oriented  Mood/Affect: Appropriate  Depression Rating: 0  Anxiety Rating: o  Thoughts of Suicide: Pt denies SI/HI  Will you contract for safety? Yes  Current AVH: Pt denies  Plan for Discharge/Comments: Pt attended discharge planning group and actively participated in group. CSW provided pt with today's workbook. Patient reports feeling well today and ready to discharge back home. She reports that she has a therapist but cannot recall his name or the name of the practice. Patient declines for CSW to make follow up appointment for her.  Transportation Means: Pt reports access to transportation  Supports: No supports mentioned at this time  Karen Kim, MSW, [employer]WA Clinical Social Worker [facility] [phone]

## 2014-05-31 NOTE — Progress Notes (Addendum)
[facility] Adult Case Management Discharge Plan :  Will you be returning to the same living situation after discharge: Yes,  patient reports that she can return to her current living situation At discharge, do you have transportation home?:Yes,  patient reports access to transportation Do you have the ability to pay for your medications:Yes,  patient will be provided with medication samples  Release of information consent forms completed and in the chart;  Patient's signature needed at discharge.  Patient to Follow up at: Follow-up Information    [facility]/2014.   Specialty:  Behavioral Health   Why:  Please present to [facility] on this date or any day Monday-Friday 8am to 3pm for assessment for therapy and medication management services.   Contact information:   [address] [place] [address] [phone]        Patient denies SI/HI:   Yes,  denies    Safety Planning and Suicide Prevention discussed:  Yes,  with patient  Gerson Fauth, [place]ristin L 05/31/2014, 11:53 AM

## 2014-05-31 NOTE — [facility] Suicide Risk Assessment (Addendum)
Demographic Factors:  18 year old single female, lives with famlily  Total Time spent with patient: 30 minutes  Psychiatric Specialty Exam: Physical Exam  ROS  Blood pressure 128/72, pulse 80, temperature 98.4 F (36.9 C), temperature source Oral, resp. rate 16, height 5' (1.524 m), weight 87.091 kg (192 lb), last menstrual period 05/08/2014.Body mass index is 37.5 kg/(m^2).  General Appearance: Well Groomed  Engineer, water::  Good  Speech:  Normal Rate  Volume:  Normal  Mood:  "OK", denies depression  Affect:  Appropriate  Thought Process:  Goal Directed and Linear  Orientation:  Full (Time, Place, and Person)  Thought Content:  no hallucinations, no delusions   Suicidal Thoughts:  No at this time denies any thoughts of hurting self and  contracts for safety  Homicidal Thoughts:  No  Memory: Recent and Remote grossly intact  Judgement:  Fair  Insight:  Fair  Psychomotor Activity:  Normal  Concentration:  Good  Recall:  Good  Fund of Knowledge:Good  Language: Good  Akathisia:  Negative  Handed:  Right  AIMS (if indicated):     Assets:  Desire for Improvement Physical Health Resilience  Sleep:  Number of Hours: 6.5    Musculoskeletal: Strength & Muscle Tone: within normal limits Gait & Station: normal Patient leans: N/A   Mental Status Per Nursing Assessment::   On Admission:  NA  Current Mental Status by Physician: I have discussed case with treatment team and have met with patient. At this time patient is fully alert, attentive, calm, describes mood as normal and denies depression, affect is appropriate, no thought disorder, no suicidal or homicidal ideations, no psychotic symptoms, future oriented. Of note, states recent overdose was not suicidal in intent but rather " I was just trying to get high" Behavior on unit has been calm and in good control- visible on unit.  Loss Factors: Limited sober support network, unemployment  Historical Factors: Denies any  history of suicidal attempts, no prior history of self injurious behaviors, denies history of violence. (+) substance abuse, has been diagnosed with Bipolar Disorder in the past .  Risk Reduction Factors:   Sense of responsibility to family, Living with another person, especially a relative and Positive coping skills or problem solving skills  Continued Clinical Symptoms:  At this time patient improved, denying depression , denying any significant anxiety symptoms, denying any SI or HI, future oriented. Requesting discharge .   Cognitive Features That Contribute To Risk:  Closed-mindedness- limited insight regarding negative impact of  Recreational drug abuse     Suicide Risk:  Mild:  Suicidal ideation of limited frequency, intensity, duration, and specificity.  There are no identifiable plans, no associated intent, mild dysphoria and related symptoms, good self-control (both objective and subjective assessment), few other risk factors, and identifiable protective factors, including available and accessible social support.  Discharge Diagnoses:   AXIS I:  Bipolar Disorder NOS AXIS II:  Deferred AXIS III:   Past Medical History  Diagnosis Date  . Scoliosis   . MRSA (methicillin resistant staph aureus) culture positive   . Bipolar affective   . Depression   . Gonorrhea    AXIS IV:  occupational problems and problems related to social environment- boyfriend actively abuses drugs, as per her information AXIS V:  60-65 upon discharge  Plan Of Care/Follow-up recommendations:  Activity:  As tolerated Diet:  Regular Tests:  NA Other:  see below  Is patient on multiple antipsychotic therapies at discharge:  No  Has Patient had three or more failed trials of antipsychotic monotherapy by history:  No  Recommended Plan for Multiple Antipsychotic Therapies: NA  Patient is requesting discharge and there are no current grounds for involuntary commitment. She is leaving unit in good  spirits. She plans to return home. Encouraged to abstain from drugs , alcohol. As discussed with Education officer, museum, patient reports she has an established therapist for ongoing psychotherapy, but did not provide name or consent to confirm appointment.     [place], [place] 05/31/2014, 12:29 PM

## 2014-05-31 NOTE — BHH Suicide Risk Assessment (Signed)
[facility] INPATIENT:  Family/Significant Other Suicide Prevention Education  Suicide Prevention Education:  Patient Refusal for Family/Significant Other Suicide Prevention Education: The patient Karen Kim has refused to provide written consent for family/significant other to be provided Family/Significant Other Suicide Prevention Education during admission and/or prior to discharge.  Physician notified. Brochure provided and SPE reviewed with patient.  Sixto Bowdish, [place]in L 05/31/2014, 12:05 PM

## 2014-06-01 NOTE — Progress Notes (Signed)
Patient Discharge Instructions:  [facility].  Per the SW the patient declined to have records sent for follow up.  Jerelene ReddenSheena E Eunice, 06/01/2014, 3:49 PM

## 2014-08-15 ENCOUNTER — Encounter (HOSPITAL_COMMUNITY): Payer: Self-pay | Admitting: Emergency Medicine

## 2014-08-15 ENCOUNTER — Emergency Department (HOSPITAL_COMMUNITY): Payer: Medicaid Other

## 2014-08-15 ENCOUNTER — Emergency Department (HOSPITAL_COMMUNITY)
Admission: EM | Admit: 2014-08-15 | Discharge: 2014-08-15 | Disposition: A | Discharge status: Home / Self Care | Payer: Medicaid Other | Attending: Emergency Medicine | Admitting: Emergency Medicine

## 2014-08-15 DIAGNOSIS — F319 Bipolar disorder, unspecified: Secondary | ICD-10-CM | POA: Insufficient documentation

## 2014-08-15 DIAGNOSIS — Y9389 Activity, other specified: Secondary | ICD-10-CM | POA: Insufficient documentation

## 2014-08-15 DIAGNOSIS — Z3202 Encounter for pregnancy test, result negative: Secondary | ICD-10-CM | POA: Insufficient documentation

## 2014-08-15 DIAGNOSIS — Z87891 Personal history of nicotine dependence: Secondary | ICD-10-CM | POA: Insufficient documentation

## 2014-08-15 DIAGNOSIS — M419 Scoliosis, unspecified: Secondary | ICD-10-CM | POA: Insufficient documentation

## 2014-08-15 DIAGNOSIS — Z8614 Personal history of Methicillin resistant Staphylococcus aureus infection: Secondary | ICD-10-CM | POA: Insufficient documentation

## 2014-08-15 DIAGNOSIS — Y998 Other external cause status: Secondary | ICD-10-CM | POA: Insufficient documentation

## 2014-08-15 DIAGNOSIS — S39012A Strain of muscle, fascia and tendon of lower back, initial encounter: Secondary | ICD-10-CM | POA: Insufficient documentation

## 2014-08-15 DIAGNOSIS — Z8619 Personal history of other infectious and parasitic diseases: Secondary | ICD-10-CM | POA: Insufficient documentation

## 2014-08-15 DIAGNOSIS — Y92410 Unspecified street and highway as the place of occurrence of the external cause: Secondary | ICD-10-CM | POA: Insufficient documentation

## 2014-08-15 DIAGNOSIS — S199XXA Unspecified injury of neck, initial encounter: Secondary | ICD-10-CM | POA: Insufficient documentation

## 2014-08-15 LAB — POC URINE PREG, ED: Preg Test, Ur: NEGATIVE

## 2014-08-15 MED ORDER — METHOCARBAMOL 500 MG PO TABS: 500.0000 mg | ORAL_TABLET | Freq: Three times a day (TID) | ORAL | Status: DC | PRN

## 2014-08-15 MED ORDER — IBUPROFEN 800 MG PO TABS: 800.0000 mg | ORAL_TABLET | Freq: Three times a day (TID) | ORAL | Status: DC

## 2014-08-15 MED ORDER — HYDROCODONE-ACETAMINOPHEN 5-325 MG PO TABS
1.0000 | ORAL_TABLET | Freq: Once | ORAL | Status: AC
  Administered 2014-08-15: 1 via ORAL
  Filled 2014-08-15: qty 1

## 2014-08-15 MED ORDER — METHOCARBAMOL 500 MG PO TABS
1000.0000 mg | ORAL_TABLET | Freq: Once | ORAL | Status: AC
  Administered 2014-08-15: 1000 mg via ORAL
  Filled 2014-08-15: qty 2

## 2014-08-15 MED ORDER — IBUPROFEN 800 MG PO TABS
800.0000 mg | ORAL_TABLET | Freq: Once | ORAL | Status: AC
  Administered 2014-08-15: 800 mg via ORAL
  Filled 2014-08-15: qty 1

## 2014-08-15 MED ORDER — HYDROCODONE-ACETAMINOPHEN 5-325 MG PO TABS: 1.0000 | ORAL_TABLET | ORAL | Status: DC | PRN
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
## 2014-08-15 NOTE — Discharge Instructions (Signed)

## 2014-09-30 ENCOUNTER — Encounter (HOSPITAL_COMMUNITY): Payer: Self-pay | Admitting: Emergency Medicine

## 2014-09-30 ENCOUNTER — Emergency Department (HOSPITAL_COMMUNITY)
Admission: EM | Admit: 2014-09-30 | Discharge: 2014-09-30 | Disposition: A | Discharge status: Home / Self Care | Payer: Medicaid Other | Attending: Emergency Medicine | Admitting: Emergency Medicine

## 2014-09-30 DIAGNOSIS — F319 Bipolar disorder, unspecified: Secondary | ICD-10-CM | POA: Insufficient documentation

## 2014-09-30 DIAGNOSIS — Z87891 Personal history of nicotine dependence: Secondary | ICD-10-CM | POA: Insufficient documentation

## 2014-09-30 DIAGNOSIS — Z9889 Other specified postprocedural states: Secondary | ICD-10-CM | POA: Insufficient documentation

## 2014-09-30 DIAGNOSIS — Z79899 Other long term (current) drug therapy: Secondary | ICD-10-CM | POA: Insufficient documentation

## 2014-09-30 DIAGNOSIS — Z8739 Personal history of other diseases of the musculoskeletal system and connective tissue: Secondary | ICD-10-CM | POA: Insufficient documentation

## 2014-09-30 DIAGNOSIS — A6009 Herpesviral infection of other urogenital tract: Secondary | ICD-10-CM | POA: Insufficient documentation

## 2014-09-30 DIAGNOSIS — A6000 Herpesviral infection of urogenital system, unspecified: Secondary | ICD-10-CM

## 2014-09-30 DIAGNOSIS — Z8614 Personal history of Methicillin resistant Staphylococcus aureus infection: Secondary | ICD-10-CM | POA: Insufficient documentation

## 2014-09-30 MED ORDER — HYDROCODONE-ACETAMINOPHEN 5-325 MG PO TABS: 1.0000 | ORAL_TABLET | Freq: Four times a day (QID) | ORAL | Status: DC | PRN

## 2014-09-30 MED ORDER — ACYCLOVIR 800 MG PO TABS
800.0000 mg | ORAL_TABLET | Freq: Once | ORAL | Status: AC
  Administered 2014-09-30: 800 mg via ORAL
  Filled 2014-09-30 (×2): qty 1

## 2014-09-30 MED ORDER — LIDOCAINE HCL 2 % EX GEL
1.0000 "application " | Freq: Once | CUTANEOUS | Status: AC
  Administered 2014-09-30: 1 via TOPICAL
  Filled 2014-09-30: qty 10

## 2014-09-30 MED ORDER — OXYCODONE-ACETAMINOPHEN 5-325 MG PO TABS
1.0000 | ORAL_TABLET | Freq: Once | ORAL | Status: AC
Start: 2014-09-30 — End: 2014-09-30
  Administered 2014-09-30: 1 via ORAL
  Filled 2014-09-30: qty 1

## 2014-09-30 MED ORDER — ACYCLOVIR 800 MG PO TABS
800.0000 mg | ORAL_TABLET | Freq: Every day | ORAL | Status: DC
Start: 2014-09-30 — End: 2014-12-29

## 2014-09-30 MED ORDER — LIDOCAINE HCL 2 % EX GEL: 1.0000 "application " | Freq: Four times a day (QID) | CUTANEOUS | Status: AC
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
## 2014-09-30 NOTE — ED Provider Notes (Signed)
CSN: [id number]     Arrival date & time 09/30/14  0142 History   First MD Initiated Contact with Patient 09/30/14 0235     Chief Complaint  Patient presents with  . Vaginal Pain     (Consider location/radiation/quality/duration/timing/severity/associated sxs/prior Treatment) HPI Comments: Patient was diagnosed with genital herpes approximately one week ago.  She was started on Valtrex 500 mg twice a day for the initial outbreak.  She was also given a prescription for Vicodin.  Mother has already given this 1 tablet at night.  Mother is controlling her narcotic.  Patient has a history of overdose recently.  Patient is a 19 y.o. female presenting with vaginal pain. The history is provided by the patient.  Vaginal Pain This is a new problem. The current episode started in the past 7 days. The problem occurs constantly. The problem has been gradually worsening. Associated symptoms include a rash. Pertinent negatives include no abdominal pain, fever or vomiting. Nothing aggravates the symptoms. She has tried nothing for the symptoms. The treatment provided no relief.    Past Medical History  Diagnosis Date  . Scoliosis   . MRSA (methicillin resistant staph aureus) culture positive   . Bipolar affective   . Depression   . Gonorrhea    Past Surgical History  Procedure Laterality Date  . Back surgery  2012    spinal fusion  . Back surgery  2012    MRSA infection- rods removed.  . Back surgery  2013    Rods put back in   Family History  Problem Relation Age of Onset  . Suicidality Father     Committed suicide   History  Substance Use Topics  . Smoking status: Former Games developer  . Smokeless tobacco: Never Used  . Alcohol Use: Yes     Comment: occasionally   OB History    No data available     Review of Systems  Constitutional: Negative for fever.  Gastrointestinal: Negative for vomiting and abdominal pain.  Genitourinary: Positive for vaginal pain. Negative for dysuria.  Skin:  Positive for rash.  All other systems reviewed and are negative.     Allergies  Review of patient's allergies indicates no known allergies.  Home Medications   Prior to Admission medications   Medication Sig Start Date End Date Taking? Authorizing Provider  lidocaine (LMX) 4 % cream Apply 1 application topically as needed (pain).   Yes Historical Provider, MD  valACYclovir (VALTREX) 500 MG tablet Take 500 mg by mouth 2 (two) times daily.   Yes Historical Provider, MD  acyclovir (ZOVIRAX) 800 MG tablet Take 1 tablet (800 mg total) by mouth 5 (five) times daily. 09/30/14   Earley Favor, NP  HYDROcodone-acetaminophen (NORCO/VICODIN) 5-325 MG per tablet Take 1 tablet by mouth every 6 (six) hours as needed. 09/30/14   Earley Favor, NP  lamoTRIgine (LAMICTAL) 25 MG tablet Take 1 tablet (25 mg total) by mouth daily. For mood stabilization Patient not taking: Reported on 08/15/2014 05/31/14   [name], NP  lidocaine (XYLOCAINE) 2 % jelly Apply 1 application topically 4 (four) times daily. 09/30/14 10/04/14  Earley Favor, NP   BP 122/68 mmHg  Pulse 98  Temp(Src) 99.8 F (37.7 C) (Oral)  Resp 19  Ht  (1.549 m)  Wt 182 lb (82.555 kg)  BMI 34.41 kg/m2  SpO2 97%  LMP 09/09/2014 Physical Exam  Constitutional: She appears well-developed and well-nourished.  HENT:  Head: Normocephalic.  Eyes: Pupils are equal, round, and  reactive to light.  Cardiovascular: Normal rate.   Pulmonary/Chest: Effort normal.  Abdominal: Soft.  Genitourinary: There is rash on the right labia. There is rash on the left labia. No vaginal discharge found.  Vesicular rash.  Labia minora, labia majora, perineum, upper inner thighs  Neurological: She is alert.  Skin: Rash noted.  Nursing note and vitals reviewed.   ED Course  Procedures (including critical care time) Labs Review Labs Reviewed - No data to display  Imaging Review No results found.   EKG Interpretation None     Patient has been receiving  therapeutic doses of Valtrex for initial outbreak.  I will increase her acyclovir Valtrex to 800 mg 5 times a day for 1 week.  I will also increase her use of Vicodin to one tablet every 6 hours.  Mother will control this as well as provide a prescription for topical eye became gel. MDM   Final diagnoses:  Genital herpes         Earley FavorGail Albino Bufford, NP 09/30/14 [id number]  Devoria AlbeIva Knapp, MD 09/30/14 0300

## 2014-09-30 NOTE — Discharge Instructions (Signed)
Genital Herpes Genital herpes is a sexually transmitted disease. This means that it is a disease passed by having sex with an infected person. There is no cure for genital herpes. The time between attacks can be months to years. The virus may live in a person but produce no problems (symptoms). This infection can be passed to a baby as it travels down the birth canal (vagina). In a newborn, this can cause central nervous system damage, eye damage, or even death. The virus that causes genital herpes is usually HSV-2 virus. The virus that causes oral herpes is usually HSV-1. The diagnosis (learning what is wrong) is made through culture results. SYMPTOMS  Usually symptoms of pain and itching begin a few days to a week after contact. It first appears as small blisters that progress to small painful ulcers which then scab over and heal after several days. It affects the outer genitalia, birth canal, cervix, penis, anal area, buttocks, and thighs. HOME CARE INSTRUCTIONS   Keep ulcerated areas dry and clean.  Take medications as directed. Antiviral medications can speed up healing. They will not prevent recurrences or cure this infection. These medications can also be taken for suppression if there are frequent recurrences.  While the infection is active, it is contagious. Avoid all sexual contact during active infections.  Condoms may help prevent spread of the herpes virus.  Practice safe sex.  Wash your hands thoroughly after touching the genital area.  Avoid touching your eyes after touching your genital area.  Inform your caregiver if you have had genital herpes and become pregnant. It is your responsibility to insure a safe outcome for your baby in this pregnancy.  Only take over-the-counter or prescription medicines for pain, discomfort, or fever as directed by your caregiver. SEEK MEDICAL CARE IF:   You have a recurrence of this infection.  You do not respond to medications and are not  improving.  You have new sources of pain or discharge which have changed from the original infection.  You have an oral temperature above 102 F (38.9 C).  You develop abdominal pain.  You develop eye pain or signs of eye infection. Document Released: 06/15/2000 Document Revised: 09/10/2011 Document Reviewed: 07/06/2009 [facility] Patient Information 2015 [place], [place]. This information is not intended to replace advice given to you by your health care provider. Make sure you discuss any questions you have with your health care provider. Please take the acyclovir 800 mg 5 times a day for 5 days, you can use the Vicodin 1 tablet every 6 hours for pain control.  You've also been given a prescription for lidocaine jelly uses 4 times a day, but no more place for 3 days

## 2014-12-29 ENCOUNTER — Encounter (HOSPITAL_COMMUNITY): Payer: Self-pay | Admitting: Emergency Medicine

## 2014-12-29 ENCOUNTER — Emergency Department (HOSPITAL_COMMUNITY)
Admission: EM | Admit: 2014-12-29 | Discharge: 2014-12-29 | Disposition: A | Discharge status: Other Acute Inpatient Hospital | Payer: 59 | Attending: Emergency Medicine | Admitting: Emergency Medicine

## 2014-12-29 ENCOUNTER — Inpatient Hospital Stay (HOSPITAL_COMMUNITY)
Admission: AD | Admit: 2014-12-29 | Discharge: 2015-01-04 | DRG: 885 | Disposition: A | Discharge status: Home / Self Care | Payer: 59 | Source: Intra-hospital | Attending: Psychiatry | Admitting: Psychiatry

## 2014-12-29 ENCOUNTER — Encounter (HOSPITAL_COMMUNITY): Payer: Self-pay | Admitting: *Deleted

## 2014-12-29 DIAGNOSIS — R45851 Suicidal ideations: Secondary | ICD-10-CM | POA: Diagnosis present

## 2014-12-29 DIAGNOSIS — F333 Major depressive disorder, recurrent, severe with psychotic symptoms: Secondary | ICD-10-CM | POA: Clinically undetermined

## 2014-12-29 DIAGNOSIS — F332 Major depressive disorder, recurrent severe without psychotic features: Principal | ICD-10-CM | POA: Diagnosis present

## 2014-12-29 DIAGNOSIS — Z3202 Encounter for pregnancy test, result negative: Secondary | ICD-10-CM | POA: Diagnosis not present

## 2014-12-29 DIAGNOSIS — Z8614 Personal history of Methicillin resistant Staphylococcus aureus infection: Secondary | ICD-10-CM | POA: Insufficient documentation

## 2014-12-29 DIAGNOSIS — F4310 Post-traumatic stress disorder, unspecified: Secondary | ICD-10-CM | POA: Diagnosis present

## 2014-12-29 DIAGNOSIS — G4700 Insomnia, unspecified: Secondary | ICD-10-CM | POA: Diagnosis present

## 2014-12-29 DIAGNOSIS — Z8659 Personal history of other mental and behavioral disorders: Secondary | ICD-10-CM

## 2014-12-29 DIAGNOSIS — Z79899 Other long term (current) drug therapy: Secondary | ICD-10-CM | POA: Insufficient documentation

## 2014-12-29 DIAGNOSIS — Z8739 Personal history of other diseases of the musculoskeletal system and connective tissue: Secondary | ICD-10-CM | POA: Diagnosis not present

## 2014-12-29 DIAGNOSIS — Z87891 Personal history of nicotine dependence: Secondary | ICD-10-CM | POA: Diagnosis not present

## 2014-12-29 DIAGNOSIS — Z981 Arthrodesis status: Secondary | ICD-10-CM

## 2014-12-29 DIAGNOSIS — F329 Major depressive disorder, single episode, unspecified: Secondary | ICD-10-CM | POA: Diagnosis present

## 2014-12-29 DIAGNOSIS — F411 Generalized anxiety disorder: Secondary | ICD-10-CM | POA: Diagnosis present

## 2014-12-29 DIAGNOSIS — Z8619 Personal history of other infectious and parasitic diseases: Secondary | ICD-10-CM | POA: Diagnosis not present

## 2014-12-29 DIAGNOSIS — F410 Panic disorder [episodic paroxysmal anxiety] without agoraphobia: Secondary | ICD-10-CM | POA: Diagnosis present

## 2014-12-29 DIAGNOSIS — Z9119 Patient's noncompliance with other medical treatment and regimen: Secondary | ICD-10-CM | POA: Diagnosis present

## 2014-12-29 DIAGNOSIS — M419 Scoliosis, unspecified: Secondary | ICD-10-CM | POA: Diagnosis present

## 2014-12-29 LAB — CBC WITH DIFFERENTIAL/PLATELET
BASOS ABS: 0 10*3/uL (ref 0.0–0.1)
Basophils Relative: 0 % (ref 0–1)
EOS PCT: 0 % (ref 0–5)
Eosinophils Absolute: 0 10*3/uL (ref 0.0–0.7)
HCT: 38 % (ref 36.0–46.0)
Hemoglobin: 12.6 g/dL (ref 12.0–15.0)
LYMPHS PCT: 16 % (ref 12–46)
Lymphs Abs: 1.4 10*3/uL (ref 0.7–4.0)
MCH: 29.5 pg (ref 26.0–34.0)
MCHC: 33.2 g/dL (ref 30.0–36.0)
MCV: 89 fL (ref 78.0–100.0)
Monocytes Absolute: 0.6 10*3/uL (ref 0.1–1.0)
Monocytes Relative: 7 % (ref 3–12)
NEUTROS PCT: 77 % (ref 43–77)
Neutro Abs: 6.7 10*3/uL (ref 1.7–7.7)
Platelets: 246 10*3/uL (ref 150–400)
RBC: 4.27 MIL/uL (ref 3.87–5.11)
RDW: 13.3 % (ref 11.5–15.5)
WBC: 8.7 10*3/uL (ref 4.0–10.5)

## 2014-12-29 LAB — BASIC METABOLIC PANEL
Anion gap: 8 (ref 5–15)
BUN: 14 mg/dL (ref 6–20)
CALCIUM: 9.2 mg/dL (ref 8.9–10.3)
CO2: 26 mmol/L (ref 22–32)
CREATININE: 0.57 mg/dL (ref 0.44–1.00)
Chloride: 105 mmol/L (ref 101–111)
GFR calc Af Amer: >60 mL/min (ref >60)
GLUCOSE: 90 mg/dL (ref 65–99)
Potassium: 3.8 mmol/L (ref 3.5–5.1)
Sodium: 139 mmol/L (ref 135–145)

## 2014-12-29 LAB — ETHANOL: Alcohol, Ethyl (B): <5 mg/dL (ref <5)

## 2014-12-29 LAB — RAPID URINE DRUG SCREEN, HOSP PERFORMED
Amphetamines: NOT DETECTED
BARBITURATES: NOT DETECTED
BENZODIAZEPINES: NOT DETECTED
COCAINE: NOT DETECTED
Opiates: NOT DETECTED
Tetrahydrocannabinol: NOT DETECTED

## 2014-12-29 LAB — I-STAT BETA HCG BLOOD, ED (MC, WL, AP ONLY)

## 2014-12-29 MED ORDER — MAGNESIUM HYDROXIDE 400 MG/5ML PO SUSP: 30.0000 mL | Freq: Every day | ORAL | Status: DC | PRN

## 2014-12-29 MED ORDER — ALUM & MAG HYDROXIDE-SIMETH 200-200-20 MG/5ML PO SUSP: 30.0000 mL | ORAL | Status: DC | PRN

## 2014-12-29 MED ORDER — HYDROXYZINE HCL 25 MG PO TABS
25.0000 mg | ORAL_TABLET | Freq: Four times a day (QID) | ORAL | Status: DC | PRN
  Administered 2014-12-29: 25 mg via ORAL
  Filled 2014-12-29: qty 1
  Filled 2014-12-29: qty 20

## 2014-12-29 MED ORDER — ACETAMINOPHEN 325 MG PO TABS
650.0000 mg | ORAL_TABLET | Freq: Four times a day (QID) | ORAL | Status: DC | PRN
  Administered 2014-12-29 – 2014-12-31 (×4): 650 mg via ORAL
  Filled 2014-12-29 (×4): qty 2

## 2014-12-29 MED ORDER — TRAZODONE HCL 50 MG PO TABS
50.0000 mg | ORAL_TABLET | Freq: Every evening | ORAL | Status: DC | PRN
  Administered 2014-12-29: 50 mg via ORAL
  Filled 2014-12-29 (×3): qty 1

## 2014-12-29 NOTE — ED Notes (Signed)
Per patient states she wants help for her depression-increased stress, no SI at this time-off meds for about a year

## 2014-12-29 NOTE — ED Notes (Signed)
[facility] called for transport at approximately 1805.
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
## 2014-12-29 NOTE — ED Provider Notes (Signed)
CSN: [id number]     Arrival date & time 12/29/14  1534 History   First MD Initiated Contact with Patient 12/29/14 1543     Chief Complaint  Patient presents with  . Depression     (Consider location/radiation/quality/duration/timing/severity/associated sxs/prior Treatment) HPI Comments: Patient here complaining of increased stress and depression. Has a history of bipolar disorder and has been out of her medications for the past year. She now endorses suicidal ideations or plan. Denies any intentional ingestions at this time. Denies any command hallucinations. Stressful situations related to a boyfriend. Does have a history of suicide attempt 3 in the past. Symptoms persistent and nothing makes them better. No treatment use prior to arrival  The history is provided by the patient.    Past Medical History  Diagnosis Date  . Scoliosis   . MRSA (methicillin resistant staph aureus) culture positive   . Bipolar affective   . Depression   . Gonorrhea    Past Surgical History  Procedure Laterality Date  . Back surgery  2012    spinal fusion  . Back surgery  2012    MRSA infection- rods removed.  . Back surgery  2013    Rods put back in   Family History  Problem Relation Age of Onset  . Suicidality Father     Committed suicide   History  Substance Use Topics  . Smoking status: Former Games developer  . Smokeless tobacco: Never Used  . Alcohol Use: Yes     Comment: occasionally   OB History    No data available     Review of Systems  All other systems reviewed and are negative.     Allergies  Review of patient's allergies indicates no known allergies.  Home Medications   Prior to Admission medications   Medication Sig Start Date End Date Taking? Authorizing Provider  acyclovir (ZOVIRAX) 800 MG tablet Take 1 tablet (800 mg total) by mouth 5 (five) times daily. 09/30/14   Earley Favor, NP  HYDROcodone-acetaminophen (NORCO/VICODIN) 5-325 MG per tablet Take 1 tablet by mouth  every 6 (six) hours as needed. 09/30/14   Earley Favor, NP  lamoTRIgine (LAMICTAL) 25 MG tablet Take 1 tablet (25 mg total) by mouth daily. For mood stabilization Patient not taking: Reported on 08/15/2014 05/31/14   [name], NP  lidocaine (LMX) 4 % cream Apply 1 application topically as needed (pain).    Historical Provider, MD  valACYclovir (VALTREX) 500 MG tablet Take 500 mg by mouth 2 (two) times daily.    Historical Provider, MD   BP 110/67 mmHg  Pulse 84  Temp(Src) 98.5 F (36.9 C) (Oral)  Resp 16  SpO2 100%  LMP 12/01/2014 Physical Exam  Constitutional: She is oriented to person, place, and time. She appears well-developed and well-nourished.  Non-toxic appearance. No distress.  HENT:  Head: Normocephalic and atraumatic.  Eyes: Conjunctivae, EOM and lids are normal. Pupils are equal, round, and reactive to light.  Neck: Normal range of motion. Neck supple. No tracheal deviation present. No thyroid mass present.  Cardiovascular: Normal rate, regular rhythm and normal heart sounds.  Exam reveals no gallop.   No murmur heard. Pulmonary/Chest: Effort normal and breath sounds normal. No stridor. No respiratory distress. She has no decreased breath sounds. She has no wheezes. She has no rhonchi. She has no rales.  Abdominal: Soft. Normal appearance and bowel sounds are normal. She exhibits no distension. There is no tenderness. There is no rebound and no CVA tenderness.  Musculoskeletal: Normal range of motion. She exhibits no edema or tenderness.  Neurological: She is alert and oriented to person, place, and time. She has normal strength. No cranial nerve deficit or sensory deficit. GCS eye subscore is 4. GCS verbal subscore is 5. GCS motor subscore is 6.  Skin: Skin is warm and dry. No abrasion and no rash noted.  Psychiatric: She has a normal mood and affect. Her speech is normal and behavior is normal. She expresses suicidal ideation. She expresses suicidal plans. She expresses no  homicidal plans.  Nursing note and vitals reviewed.   ED Course  Procedures (including critical care time) Labs Review Labs Reviewed  CBC WITH DIFFERENTIAL/PLATELET  BASIC METABOLIC PANEL  ETHANOL  URINE RAPID DRUG SCREEN, HOSP PERFORMED  I-STAT BETA HCG BLOOD, ED ([facility], [facility], [facility] ONLY)    Imaging Review No results found.   EKG Interpretation None      MDM   Final diagnoses:  None    Patient is to be medically cleared and then placed by behavior health    Lorre NickAnthony Shraddha Lebron, MD 12/29/14 1558
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
## 2014-12-29 NOTE — Progress Notes (Signed)
[facility] INPATIENT:  Family/Significant Other Suicide Prevention Education  Suicide Prevention Education:  Patient Refusal for Family/Significant Other Suicide Prevention Education: The patient Karen Kim has refused to provide written consent for family/significant other to be provided Family/Significant Other Suicide Prevention Education during admission and/or prior to discharge.  Physician notified.  Roselie SkinnerOgunjobi, Jaret Coppedge [facility] 12/29/2014, 7:56 PM

## 2014-12-29 NOTE — BH Assessment (Signed)
[facility] by Julieanne CottonJosephine, NP. [facility](AC) assigned patient to bed #3. Support paperwork completed. Nursing report # [phone].

## 2014-12-29 NOTE — ED Notes (Signed)
Report called to Nadean CorwinKim Maggio, RN.

## 2014-12-29 NOTE — Progress Notes (Signed)
Report received from Melynda Rippleoyka Perry, TTS Counselor.  Patient accepted to OBS bed #3. Rosey BathKelly Sai Moura, RN

## 2014-12-29 NOTE — ED Notes (Signed)
Karen Kim was oriented to unit. Denies current SI/HI/AVH/pain. Urged her to approach staff with needs/concerns. LCSW with pt now. Will continue to monitor for needs/safety.

## 2014-12-30 DIAGNOSIS — Z981 Arthrodesis status: Secondary | ICD-10-CM | POA: Diagnosis not present

## 2014-12-30 DIAGNOSIS — F4310 Post-traumatic stress disorder, unspecified: Secondary | ICD-10-CM

## 2014-12-30 DIAGNOSIS — Z8659 Personal history of other mental and behavioral disorders: Secondary | ICD-10-CM | POA: Diagnosis not present

## 2014-12-30 DIAGNOSIS — F332 Major depressive disorder, recurrent severe without psychotic features: Secondary | ICD-10-CM | POA: Diagnosis present

## 2014-12-30 DIAGNOSIS — R45851 Suicidal ideations: Secondary | ICD-10-CM | POA: Diagnosis present

## 2014-12-30 DIAGNOSIS — Z9119 Patient's noncompliance with other medical treatment and regimen: Secondary | ICD-10-CM | POA: Diagnosis present

## 2014-12-30 DIAGNOSIS — G4700 Insomnia, unspecified: Secondary | ICD-10-CM | POA: Diagnosis present

## 2014-12-30 DIAGNOSIS — F411 Generalized anxiety disorder: Secondary | ICD-10-CM | POA: Diagnosis present

## 2014-12-30 DIAGNOSIS — F333 Major depressive disorder, recurrent, severe with psychotic symptoms: Secondary | ICD-10-CM | POA: Diagnosis not present

## 2014-12-30 DIAGNOSIS — F410 Panic disorder [episodic paroxysmal anxiety] without agoraphobia: Secondary | ICD-10-CM | POA: Diagnosis present

## 2014-12-30 DIAGNOSIS — Z87891 Personal history of nicotine dependence: Secondary | ICD-10-CM | POA: Diagnosis not present

## 2014-12-30 DIAGNOSIS — M419 Scoliosis, unspecified: Secondary | ICD-10-CM | POA: Diagnosis present

## 2014-12-30 LAB — URINE MICROSCOPIC-ADD ON

## 2014-12-30 LAB — URINALYSIS, ROUTINE W REFLEX MICROSCOPIC
Glucose, UA: NEGATIVE mg/dL
HGB URINE DIPSTICK: NEGATIVE
Ketones, ur: NEGATIVE mg/dL
Nitrite: POSITIVE — AB
Protein, ur: NEGATIVE mg/dL
Specific Gravity, Urine: 1.04 — ABNORMAL HIGH (ref 1.005–1.030)
Urobilinogen, UA: 1 mg/dL (ref 0.0–1.0)
pH: 6.5 (ref 5.0–8.0)

## 2014-12-30 LAB — TSH: TSH: 0.636 u[IU]/mL (ref 0.350–4.500)

## 2014-12-30 LAB — PREGNANCY, URINE: Preg Test, Ur: NEGATIVE

## 2014-12-30 LAB — HIV ANTIBODY (ROUTINE TESTING W REFLEX): HIV SCREEN 4TH GENERATION: NONREACTIVE

## 2014-12-30 LAB — MAGNESIUM: Magnesium: 1.7 mg/dL (ref 1.7–2.4)

## 2014-12-30 MED ORDER — CITALOPRAM HYDROBROMIDE 10 MG PO TABS
10.0000 mg | ORAL_TABLET | Freq: Every day | ORAL | Status: DC
  Administered 2014-12-31 – 2015-01-04 (×5): 10 mg via ORAL
  Filled 2014-12-30 (×2): qty 14
  Filled 2014-12-30: qty 1
  Filled 2014-12-30 (×3): qty 14
  Filled 2014-12-30: qty 1

## 2014-12-30 MED ORDER — TRAZODONE HCL 50 MG PO TABS
50.0000 mg | ORAL_TABLET | Freq: Every evening | ORAL | Status: DC | PRN
  Administered 2014-12-30: 50 mg via ORAL
  Filled 2014-12-30: qty 1

## 2014-12-30 MED ORDER — CITALOPRAM HYDROBROMIDE 10 MG PO TABS
10.0000 mg | ORAL_TABLET | Freq: Every day | ORAL | Status: DC
  Administered 2014-12-30: 10 mg via ORAL
  Filled 2014-12-30 (×2): qty 1
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
## 2014-12-30 NOTE — Progress Notes (Signed)
EKG completed per providers orders and paper print out placed on pt's chart. Pt tolerated procedure well.

## 2014-12-30 NOTE — Progress Notes (Signed)
Pt attended karaoke group tonight. 

## 2014-12-30 NOTE — Discharge Summary (Signed)
Karen DrownMarissa J Kim will be admitted to adult unit 400 hall for inpatient treatment Major Depression recurrent, sever, without psychotic features.  Please see H&P for further notes  Shuvon B. Rankin FNP-BC    Case discussed with me as above  Nehemiah MassedFernando [name], MD
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
## 2014-12-30 NOTE — Tx Team (Signed)
Initial Interdisciplinary Treatment Plan   PATIENT STRESSORS: Medication change/non-compliance Family conflict   PATIENT STRENGTHS: Ability for insight Average or above average intelligence Communication skills   PROBLEM LIST: Problem List/Patient Goals Date to be addressed Date deferred Reason deferred Estimated date of resolution  Safety/Suicide Risk 12/30/2014     Depression 12/30/2014     Anxiety 12/30/2014     "Get better mentally." 12/30/2014     "Set better goals for myself." 12/30/2014                              DISCHARGE CRITERIA:  Improved stabilization in mood, thinking, and/or behavior Verbal commitment to aftercare and medication compliance  PRELIMINARY DISCHARGE PLAN: Outpatient therapy Placement in alternative living arrangements  PATIENT/FAMIILY INVOLVEMENT: This treatment plan has been presented to and reviewed with the patient, Kathyrn DrownMarissa J Scheunemann.  The patient and family have been given the opportunity to ask questions and make suggestions.  Lendell CapriceGuthrie, Mouna Yager A 12/30/2014, 5:30 PM
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
## 2014-12-30 NOTE — Progress Notes (Signed)
D: Patient is in a depressed mood.  She is observed resting quietly in bed, respirations even and unlabored, color satisfactory. Writer woke patient up to introduce self to patient and to conduct an assessment.  Patient's responses during assessment were brief, often with one word responses.  Following assessment, patient closed her eyes and continued resting in the bed.  Patient denies SI/HI/AVH. Patient has complaints of back pain but refuses medication and/or treatment.  Patient reports being tired.   A: Patient informed to notify staff with problems or concerns.   R: Patient cooperative. Safety maintained on unit.

## 2014-12-31 ENCOUNTER — Encounter (HOSPITAL_COMMUNITY): Payer: Self-pay | Admitting: Psychiatry

## 2014-12-31 DIAGNOSIS — F333 Major depressive disorder, recurrent, severe with psychotic symptoms: Secondary | ICD-10-CM | POA: Clinically undetermined

## 2014-12-31 DIAGNOSIS — Z8659 Personal history of other mental and behavioral disorders: Secondary | ICD-10-CM

## 2014-12-31 DIAGNOSIS — F4310 Post-traumatic stress disorder, unspecified: Secondary | ICD-10-CM | POA: Diagnosis present

## 2014-12-31 MED ORDER — HYDROXYZINE HCL 25 MG PO TABS: 25.0000 mg | ORAL_TABLET | Freq: Four times a day (QID) | ORAL | Status: DC | PRN

## 2014-12-31 MED ORDER — IBUPROFEN 400 MG PO TABS
400.0000 mg | ORAL_TABLET | Freq: Four times a day (QID) | ORAL | Status: DC | PRN
  Administered 2014-12-31 – 2015-01-01 (×2): 400 mg via ORAL
  Filled 2014-12-31 (×2): qty 1

## 2014-12-31 MED ORDER — QUETIAPINE FUMARATE 25 MG PO TABS: 25.0000 mg | ORAL_TABLET | Freq: Every evening | ORAL | Status: DC | PRN

## 2014-12-31 MED ORDER — BOOST / RESOURCE BREEZE PO LIQD
1.0000 | Freq: Two times a day (BID) | ORAL | Status: DC
Start: 2014-12-31 — End: 2015-01-05
  Administered 2014-12-31 – 2015-01-02 (×3): 1 via ORAL
  Filled 2014-12-31 (×13): qty 1

## 2014-12-31 MED ORDER — CITALOPRAM HYDROBROMIDE 10 MG PO TABS: 10.0000 mg | ORAL_TABLET | Freq: Every day | ORAL | Status: DC

## 2014-12-31 MED ORDER — QUETIAPINE FUMARATE 25 MG PO TABS
25.0000 mg | ORAL_TABLET | Freq: Every evening | ORAL | Status: DC | PRN
  Administered 2014-12-31: 25 mg via ORAL
  Filled 2014-12-31: qty 28
  Filled 2014-12-31 (×2): qty 1
  Filled 2014-12-31 (×3): qty 28
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
## 2014-12-31 NOTE — Tx Team (Addendum)
Interdisciplinary Treatment Plan Update (Adult) Date: 12/31/2014   Time Reviewed: 9:30 AM  Progress in Treatment: Attending groups: Continuing to assess, patient new to milieu Participating in groups: Continuing to assess, patient new to milieu Taking medication as prescribed: Yes Tolerating medication: Yes Family/Significant other contact made: No, continuing to assess for appropriate contacts Patient understands diagnosis: Yes Discussing patient identified problems/goals with staff: Yes Medical problems stabilized or resolved: Yes Denies suicidal/homicidal ideation: Yes Issues/concerns per patient self-inventory: Yes Other:  New problem(s) identified: N/A  Discharge Plan or Barriers:  7/1: CSW continuing to assess.  Reason for Continuation of Hospitalization:  Depression Anxiety Medication Stabilization   Comments: N/A  Estimated length of stay: 3-5 days  For review of initial/current patient goals, please see plan of care. Patient is a 19 year old female admitted for SI due to relationship issues and feeling unsupported by family. She has a history of 3 prior suicide attempts (all overdoses) and 3 prior inpatient pyshciatric admissions- [facility]. Mild history of THC and alcohol use but denies current addiction issues. Patient lives in [place] with family. Patient will benefit from crisis stabilization, medication evaluation, group therapy, and psycho education in addition to case management for discharge planning. Patient and CSW reviewed pt's identified goals and treatment plan. Pt verbalized understanding and agreed to treatment plan.   Attendees:  Patient:    Family:    Physician: Dr. Dub MikesLugo, Dr. Jama Flavorsobos MD  12/31/2014 9:30 AM  Nursing: Onnie BoerJennifer Clark, RN Case manager  12/31/2014 9:30 AM  Clinical Social Worker Lamar SprinklesLauren Carter, LCSWA, MSW 12/31/2014 9:30 AM  Other: Leisa LenzValerie Enoch, Vesta MixerMonarch Liasion 12/31/2014 9:30 AM  Clinical:  Vivi FernsAshley Strader, [facility], [place]na Press,  Quintella ReichertBeverly Knight, RN  12/31/2014 9:30 AM  Other: Juline PatchQuylle Hodnett, LCSW 12/31/2014 9:30 AM  Other:      Scribe for Treatment Team:  Samuella BruinKristin Quatisha Zylka, MSW, LCSWA [phone]

## 2014-12-31 NOTE — Progress Notes (Signed)
NUTRITION ASSESSMENT  Pt identified as at risk on the Malnutrition Screen Tool  INTERVENTION: 1. Educated patient on the importance of nutrition and encouraged intake of food and beverages. 2. Discussed weight goals. 3. Supplements: will order Resource Breeze BID, each supplement provides 250 kcal and 9 grams of protein   NUTRITION DIAGNOSIS: Unintentional weight loss related to sub-optimal intake as evidenced by pt report.   Goal: Pt to meet >/= 90% of their estimated nutrition needs.  Monitor:  PO intake  Assessment:  Pt seen for MST. Pt with depression and suicidal ideation on admission; 3 attempted suicides in the past.  Per weight hx review, pt has lost 10 lbs (5% body weight loss) in the past 3 months which is not significant for time frame.  Will order Resource Breeze BID to supplement.  19 y.o. female  Height: Ht Readings from Last 1 Encounters:  12/30/14 5\' 1"  (1.549 m) (10 %*, Z = -1.29)   * Growth percentiles are based on CDC 2-20 Years data.    Weight: Wt Readings from Last 1 Encounters:  12/30/14 172 lb (78.019 kg) (92 %*, Z = 1.42)   * Growth percentiles are based on CDC 2-20 Years data.    Weight Hx: Wt Readings from Last 10 Encounters:  12/30/14 172 lb (78.019 kg) (92 %*, Z = 1.42)  09/30/14 182 lb (82.555 kg) (95 %*, Z = 1.64)  05/26/14 192 lb (87.091 kg) (97 %*, Z = 1.83)  05/24/14 194 lb 10.7 oz (88.3 kg) (97 %*, Z = 1.87)  04/08/14 180 lb (81.647 kg) (95 %*, Z = 1.62)  01/18/14 188 lb 11.4 oz (85.6 kg) (96 %*, Z = 1.79)  10/26/13 180 lb (81.647 kg) (95 %*, Z = 1.65)  03/12/12 170 lb 14.4 oz (77.52 kg) (94 %*, Z = 1.56)  08/20/11 180 lb 8.9 oz (81.9 kg) (96 %*, Z = 1.76)   * Growth percentiles are based on CDC 2-20 Years data.    BMI:  Body mass index is 32.52 kg/(m^2). Pt meets criteria for obesity based on current BMI.  Estimated Nutritional Needs: Kcal: 25-30 kcal/kg Protein: > 1 gram protein/kg Fluid: 1 ml/kcal  Diet Order: Diet  regular Room service appropriate?: Yes; Fluid consistency:: Thin Pt is also offered choice of unit snacks mid-morning and mid-afternoon.  Pt is eating as desired.   Lab results and medications reviewed.     Trenton GammonJessica Anitta Tenny, RD, LDN Inpatient Clinical Dietitian Pager # [phone] After hours/weekend pager # [phone]

## 2014-12-31 NOTE — [facility] Group Notes (Addendum)
[facility] LCSW Group Therapy 12/31/2014 1:15 PM Type of Therapy: Group Therapy Participation Level: Active  Participation Quality: Attentive  Affect: Depressed and Flat  Cognitive: Alert and Oriented  Insight: Developing/Improving and Engaged  Engagement in Therapy: Developing/Improving and Engaged  Modes of Intervention: Clarification, Confrontation, Discussion, Education, Exploration, Limit-setting, Orientation, Problem-solving, Rapport Building, Dance movement psychotherapisteality Testing, Socialization and Support  Summary of Progress/Problems: The topic for today was feelings about relapse. Pt discussed what relapse prevention is to them and identified triggers that they are on the path to relapse. Pt processed their feeling towards relapse and was able to relate to peers. Pt discussed coping skills that can be used for relapse prevention. Patient was observed actively listening during group but did not participate despite CSW encouragement.   Samuella BruinKristin Alayziah Tangeman, MSW, [employer]CS[employer] Clinical Social Worker [facility] [phone]
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
## 2014-12-31 NOTE — Progress Notes (Signed)
Recreation Therapy Notes  Date: 07.01.16 Time: 9:30 am Location: [address] Group Room  Group Topic: Stress Management  Goal Area(s) Addresses:  Patient will verbalize importance of using healthy stress management.  Patient will identify positive emotions associated with healthy stress management.   Intervention: Stress Management  Activity :  Guided Imagery Script.  LRT introduced and educated patients on stress management technique of guided imagery.  Kim script was used to deliver the technique to patients.  Patients were asked to follow script read aloud by LRT to engage in practicing the stress management technique.  Education:  Stress Management, Discharge Planning.   Education Outcome: Acknowledges edcuation/In group clarification offered/Needs additional education  Clinical Observations/Feedback: Patient did not attend group.   Karen Kim, LRT/CTRS         Karen Kim 12/31/2014 1:32 PM 

## 2014-12-31 NOTE — Progress Notes (Signed)
D:Patient in the dayroom interacting with peers on approach.  Patient states she had a bad morning but states it got better.  Patient states she is working on her relationship with her parents.  Patient has been cooperative with staff and peers tonight.  Patient states her goal when she is discharged is to go back to school or find a job. Patient denies SI/HI and denies AVH. A: Staff to monitor Q 15 mins for safety.  Encouragement and support offered.  Scheduled medications administered per orders. R: Patient remains safe on the unit.  Patient attended group tonight.  Patient visible on the unit and interacting with peers.  Patient taking administered medications.

## 2014-12-31 NOTE — Progress Notes (Signed)
Adult Psychoeducational Group Note  Date:  12/31/2014 Time:  8:37 PM  Group Topic/Focus:  Wrap-Up Group:   The focus of this group is to help patients review their daily goal of treatment and discuss progress on daily workbooks.  Participation Level:  Active  Participation Quality:  Appropriate  Affect:  Appropriate  Cognitive:  Alert  Insight: Appropriate  Engagement in Group:  Engaged  Modes of Intervention:  Discussion  Additional Comments:  Pt stated that she had an "ok" day. She has learned a lot from listening to others. Her goal for tomorrow is to participate more in group.   Flonnie HailstoneCOOKE, Anis Degidio R 12/31/2014, 8:37 PM

## 2014-12-31 NOTE — Progress Notes (Signed)
D: Pt at this time is alert and oreientedx3. Pt endorses mild anxiety and depression. Pt however denies SI/HI. Pt verbalizes that she came in voluntary to get her meds right because we plan to start college in August. Pt has been isolative with minimal interaction with staffs and peers. A: Medications administered as prescribed. Support, encouragement and safe environment offered. 15 minute safety checks continue. R: Pt attended group, was med compliant. 15 minutes safety checks continues

## 2014-12-31 NOTE — [facility] Group Notes (Signed)
[facility] L[facility] Discharge Planning Group Note  12/31/2014  8:45 AM  Participation Quality: Did Not Attend. Patient invited to participate but declined.  Samuella BruinKristin Manual Navarra, MSW, [employer] Clinical Social Worker [facility] [phone]

## 2015-01-01 DIAGNOSIS — R45851 Suicidal ideations: Secondary | ICD-10-CM

## 2015-01-01 LAB — LIPID PANEL
CHOL/HDL RATIO: 3 ratio
Cholesterol: 125 mg/dL (ref 0–200)
HDL: 42 mg/dL (ref >40)
LDL CALC: 73 mg/dL (ref 0–99)
Triglycerides: 49 mg/dL (ref <150)
VLDL: 10 mg/dL (ref 0–40)

## 2015-01-01 LAB — HEMOGLOBIN A1C
Hgb A1c MFr Bld: 5.1 % (ref 4.8–5.6)
Mean Plasma Glucose: 100 mg/dL

## 2015-01-01 MED ORDER — QUETIAPINE FUMARATE 25 MG PO TABS
25.0000 mg | ORAL_TABLET | Freq: Every evening | ORAL | Status: DC | PRN
  Filled 2015-01-01 (×8): qty 1

## 2015-01-01 MED ORDER — QUETIAPINE FUMARATE 25 MG PO TABS
25.0000 mg | ORAL_TABLET | Freq: Two times a day (BID) | ORAL | Status: DC
  Administered 2015-01-01: 25 mg via ORAL
  Filled 2015-01-01 (×8): qty 1

## 2015-01-01 NOTE — Progress Notes (Signed)
[facility] MD Progress Note  01/01/2015 5:51 PM Karen Kim  MRN:  [id number] Subjective:  Pt states: "I've been very overwhelmed with my ex and I can't get it off my mind so that's what got me down; I got stuck on that".   Objective: Pt seen and chart reviewed. Pt continues to report depression and severe anxiety although denies suicidal/homicidal ideation. Denies psychosis and does not appear to be responding to internal stimuli. Pt reports mild improvement with Seroquel at night in regard to rumination interfering with sleep, but minimal improvement during daytime hours. She also continues to report paranoid ideation without any known triggers which persists throughout the day. Pt receptive to Seroquel 25mg  bid to address this concern as well as her anxiety.   Principal Diagnosis: MDD (major depressive disorder), recurrent, severe, with psychosis     Patient Active Problem List   Diagnosis Date Noted  . MDD (major depressive disorder), recurrent, severe, with psychosis 12/31/2014  . History of bipolar disorder 12/31/2014  . PTSD (post-traumatic stress disorder) 12/31/2014  . Altered mental status 05/23/2014  . Overdose 05/23/2014  . Metabolic encephalopathy 05/23/2014  . Hypotension, unspecified 01/17/2014  . Bradycardia 01/17/2014  . T wave inversion in EKG 01/17/2014  . Drug overdose 01/16/2014  . Gonorrhea      ADL's:  Intact  Sleep: Good  Appetite:  Fair yet improving  Suicidal Ideation:  Denies  Homicidal Ideation:  Denies  Psychiatric Specialty Exam: Physical Exam  Review of Systems  Psychiatric/Behavioral: Positive for depression. The patient is nervous/anxious.   All other systems reviewed and are negative.   Blood pressure 106/60, pulse 100, temperature 98.7 F (37.1 C), temperature source Oral, resp. rate 17, height 5\' 1"  (1.549 m), weight 78.019 kg (172 lb), last menstrual period 12/01/2014.Body mass index is 32.52 kg/(m^2).  General Appearance: Well Groomed   Patent attorney::  Good  Speech:  Clear and Coherent  Volume:  Normal  Mood:  Negative  Affect:  Appropriate  Thought Process:  Intact  Orientation:  Full (Time, Place, and Person)  Thought Content:  WDL  Suicidal Thoughts:  Yes.  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 acetaminophen (TYLENOL) tablet 650 mg  650 mg Oral Q6H PRN Earney Navy, NP   650 mg at 12/31/14 0647  . alum & mag hydroxide-simeth (MAALOX/MYLANTA) 200-200-20 MG/5ML suspension 30 mL  30 mL Oral Q4H PRN Earney Navy, NP      . citalopram (CELEXA) tablet 10 mg  10 mg Oral Daily Shuvon B Rankin, NP   10 mg at 01/01/15 0824  . feeding supplement (RESOURCE BREEZE) (RESOURCE BREEZE) liquid 1 Container  1 Container Oral BID BM Renie Ora, RD   1 Container at 01/01/15 [phone]  . hydrOXYzine (ATARAX/VISTARIL) tablet 25 mg  25 mg Oral Q6H PRN Kerry Hough, PA-C   25 mg at 12/29/14  2310  . ibuprofen (ADVIL,MOTRIN) tablet 400 mg  400 mg Oral Q6H PRN Adonis BrookSheila Agustin, NP   400 mg at 12/31/14 1501  . magnesium hydroxide (MILK OF MAGNESIA) suspension 30 mL  30 mL Oral Daily PRN Earney NavyJosephine C Onuoha, NP      . QUEtiapine (SEROQUEL) tablet 25 mg  25 mg Oral BID Beau FannyJohn C Withrow, FNP      . QUEtiapine (SEROQUEL) tablet 25 mg  25 mg Oral QHS,MR X 1 John C Withrow, FNP      . traZODone (DESYREL) tablet 50 mg  50 mg Oral QHS PRN Shuvon  B Rankin, NP   50 mg at 12/30/14 2221    Lab Results:  Results for orders placed or performed during the hospital encounter of 12/29/14 (from the past 48 hour(s))  Magnesium     Status: None   Collection Time: 12/30/14  7:19 PM  Result Value Ref Range   Magnesium 1.7 1.7 - 2.4 mg/dL    Comment: Performed at [facility]  TSH     Status: None   Collection Time: 12/30/14  7:20 PM  Result Value Ref Range   TSH 0.636 0.350 - 4.500 uIU/mL    Comment: Performed at [facility]  Hemoglobin A1c     Status: None   Collection Time: 12/30/14  7:23 PM  Result Value Ref Range   Hgb A1c MFr Bld 5.1 4.8 - 5.6 %    Comment: (NOTE)         Pre-diabetes: 5.7 - 6.4         Diabetes: >6.4         Glycemic control for adults with diabetes: <7.0    Mean Plasma Glucose 100 mg/dL    Comment: (NOTE) Performed At: [facility] [address] [place], [place] [id number] Mila HomerHancock William F MD NW:[id number]:[phone] Performed at [facility]   Lipid panel     Status: None   Collection Time: 01/01/15  6:30 AM  Result Value Ref Range   Cholesterol 125 0 - 200 mg/dL   Triglycerides 49 <578<150 mg/dL   HDL 42 >46>40 mg/dL   Total CHOL/HDL Ratio 3.0 RATIO   VLDL 10 0 - 40 mg/dL   LDL Cholesterol 73 0 - 99 mg/dL    Comment:        Total Cholesterol/HDL:CHD Risk Coronary Heart Disease Risk Table                     Men   Women  1/2 Average Risk   3.4   3.3  Average Risk       5.0   4.4  2 X Average Risk   9.6   7.1  3 X Average Risk  23.4   11.0        Use the calculated Patient Ratio above and the CHD Risk Table to determine the patient's CHD Risk.        ATP III CLASSIFICATION (LDL):  <100     mg/dL   Optimal  [id number]  mg/dL   Near or Above                    Optimal  130-159  mg/dL   Borderline  [id number]  mg/dL   High  >401>190     mg/dL   Very High [facility]formed [facility]     Physical Findings: AIMS: Facial and Oral Movements Muscles  of Facial Expression:  None, normal Lips and Perioral Area: None, normal Jaw: None, normal Tongue: None, normal,Extremity Movements Upper (arms, wrists, hands, fingers): None, normal Lower (legs, knees, ankles, toes): None, normal, Trunk Movements Neck, shoulders, hips: None, normal, Overall Severity Severity of abnormal movements (highest score from questions above): None, normal Incapacitation due to abnormal movements: None, normal Patient's awareness of abnormal movements (rate only patient's report): No Awareness, Dental Status Current problems with teeth and/or dentures?: No Does patient usually wear dentures?: No  CIWA:  CIWA-Ar Total: 3 COWS:     Treatment Plan Summary: MDD (major depressive disorder), recurrent, severe, with psychosis, improving, managed as below:  Daily contact with patient to assess and evaluate symptoms and progress in treatment Medication management  Medications:  -Continue Celexa  for depression -Continue Seroquel  qhs PRN racing thoughts at night -Add Seroquel  bid for anxiety/paranoid ideation -Continue Vistaril  q6h PRN for breakthrough anxiety -DiscontinueTrazodone    Medical Decision Making Problem Points:  Established problem, stable/improving (1), Review of last therapy session (1) and Review of psycho-social stressors (1) Data Points:  Review of medication regiment & side effects (2) Review of new medications or change in dosage (2)  Withrow, Everardo All, FNP-BC 01/01/2015, 5:51 PM I agree with assessment and plan Madie Reno A. Dub Mikes, M.D.

## 2015-01-01 NOTE — Progress Notes (Signed)
D: Pt talked about the disfunctional relationship she has with her boyfriend. Pt reports boyfriend tried to commit suicide and she was able to talk him out of it but feels guilty because he blamed everything on her. Patient reports she knows her relationship is toxic but she believes she can help change him and have a better relationship with him.  Pt denies SI/HI/AVH and pain. Pt denies any needs or concerns.  Cooperative with assessment. No acute distressed noted at this time.   A: Met with pt 1:1. Medications administered as prescribed. Support and encouragement provider to attend groups and engage in milieu. Pt encouraged to discuss feelings and come to staff with any question or concerns.   R: Patient remains safe. She is complaint with medications.

## 2015-01-01 NOTE — [facility] Group Notes (Signed)
The focus of this group is to educate the patient on the purpose and policies of crisis stabilization and provide a format to answer questions about their admission.  The group details unit policies and expectations of patients while admitted.  Patient did not attend 0900 nurse education orientation group this morning.  Patient stayed in bed.   

## 2015-01-01 NOTE — Progress Notes (Signed)
Pt talking with RN during wrap-up group.

## 2015-01-01 NOTE — Progress Notes (Signed)
Patient ID: Karen DrownMarissa J Kim, female   DOB: [birth year], 19 y.o.   MRN: [id number]  D: Pt has been very flat and depressed on the unit today, she has attended all groups and engaged in treatment. Pt reported that her depression was a 3, her hopelessness was a 0, and her anxiety was a 8. Pt reported being negative SI/HI, no AH/VH noted. A: 15 min checks continued for patient safety. R: Pt safety maintained.

## 2015-01-01 NOTE — [facility] Group Notes (Signed)
[facility] LCSW Group Therapy Note  01/01/2015 11:15 AM  Type of Therapy and Topic:  Group Therapy: Avoiding Self-Sabotaging and Enabling Behaviors  Participation Level:  Did Not Attend; despite encouragement of MHT and CSW    Carney Bernatherine C Harrill, LCSW

## 2015-01-02 NOTE — Plan of Care (Signed)
Problem: Diagnosis: Increased Risk For Suicide Attempt Goal: STG-Patient Will Comply With Medication Regime Outcome: Progressing Pt is safe and denies any suicidal ideation today.

## 2015-01-02 NOTE — Progress Notes (Signed)
[facility] MD Progress Note  01/02/2015 2:11 PM Karen Kim  MRN:  [id number] Subjective:  Pt states: "I'm feeling a lot better. I slept a lot better and my anxiety is way down".   Objective: Pt seen and chart reviewed. Pt reports that she feels the Seroquel is helping greatly with sleep at night and anxiety/paranoid thinking during the day. Pt presents as sleepy, although well-rested by self-report. Pt denies suicidal/homicidal ideation. Denies psychosis and does not appear to be responding to internal stimuli. Pt is participating in group therapy and reports that, although slightly sleepy, she would like to continue the Seroquel due to her improvement.   Principal Diagnosis: MDD (major depressive disorder), recurrent, severe, with psychosis     Patient Active Problem List   Diagnosis Date Noted  . MDD (major depressive disorder), recurrent, severe, with psychosis 12/31/2014  . History of bipolar disorder 12/31/2014  . PTSD (post-traumatic stress disorder) 12/31/2014  . Altered mental status 05/23/2014  . Overdose 05/23/2014  . Metabolic encephalopathy 05/23/2014  . Hypotension, unspecified 01/17/2014  . Bradycardia 01/17/2014  . T wave inversion in EKG 01/17/2014  . Drug overdose 01/16/2014  . Gonorrhea      ADL's:  Intact  Sleep: Good  Appetite:  Good  Suicidal Ideation:  Denies  Homicidal Ideation:  Denies  Psychiatric Specialty Exam: Physical Exam  Review of Systems  Psychiatric/Behavioral: Positive for depression. The patient is nervous/anxious and has insomnia (improving).   All other systems reviewed and are negative.   Blood pressure 103/52, pulse 69, temperature 97.3 F (36.3 C), temperature source Oral, resp. rate 16, height  (1.549 m), weight 78.019 kg (172 lb), last menstrual period 12/01/2014.Body mass index is 32.52 kg/(m^2).  General Appearance: Well Groomed  Patent attorney::  Good  Speech:  Clear and Coherent  Volume:  Normal  Mood:  Euthymic   Affect:  Appropriate  Thought Process:  Intact  Orientation:  Full (Time, Place, and Person)  Thought Content:  WDL  Suicidal Thoughts:  Yes.  with intent/plan although minimizing  Homicidal Thoughts:  No  Memory:  Immediate;   Good Recent;   Good  Judgement:  Fair  Insight:  Fair  Psychomotor Activity:  Normal  Concentration:  Good  Recall:  Good  Fund of Knowledge:Fair  Language: Good  Akathisia:  No  Handed:    AIMS (if indicated):     Assets:  Manufacturing systems engineer Physical Health Social Support  Sleep:  Number of Hours: 6.75   Musculoskeletal: Strength & Muscle Tone: within normal limits Gait & Station: normal Patient leans: N/A  Current Medications: Current Facility-Administered Medications  Medication Dose Route Frequency Provider Last Rate Last Dose  . acetaminophen (TYLENOL) tablet 650 mg  650 mg Oral Q6H PRN Earney Navy, NP   650 mg at 12/31/14 0647  . alum & mag hydroxide-simeth (MAALOX/MYLANTA) 200-200-20 MG/5ML suspension 30 mL  30 mL Oral Q4H PRN Earney Navy, NP      . citalopram (CELEXA) tablet 10 mg  10 mg Oral Daily Shuvon B Rankin, NP   10 mg at 01/02/15 0839  . feeding supplement (RESOURCE BREEZE) (RESOURCE BREEZE) liquid 1 Container  1 Container Oral BID BM Renie Ora, RD   1 Container at 01/02/15 [phone]  . hydrOXYzine (ATARAX/VISTARIL) tablet 25 mg  25 mg Oral Q6H PRN Kerry Hough, PA-C   25 mg at 12/29/14 2310  . ibuprofen (ADVIL,MOTRIN) tablet 400 mg  400 mg Oral Q6H PRN Adonis Brook, NP  400 mg at 01/01/15 2031  . magnesium hydroxide (MILK OF MAGNESIA) suspension 30 mL  30 mL Oral Daily PRN Earney NavyJosephine C Onuoha, NP      . QUEtiapine (SEROQUEL) tablet 25 mg  25 mg Oral BID Beau FannyJohn C Withrow, FNP   25 mg at 01/01/15 2032  . QUEtiapine (SEROQUEL) tablet 25 mg  25 mg Oral QHS,MR X 1 Beau FannyJohn C Withrow, FNP   25 mg at 01/01/15 2200    Lab Results:  Results for orders placed or performed during the hospital encounter of 12/29/14 (from the  past 48 hour(s))  Lipid panel     Status: None   Collection Time: 01/01/15  6:30 AM  Result Value Ref Range   Cholesterol 125 0 - 200 mg/dL   Triglycerides 49 <098<150 mg/dL   HDL 42 >11>40 mg/dL   Total CHOL/HDL Ratio 3.0 RATIO   VLDL 10 0 - 40 mg/dL   LDL Cholesterol 73 0 - 99 mg/dL    Comment:        Total Cholesterol/HDL:CHD Risk Coronary Heart Disease Risk Table                     Men   Women  1/2 Average Risk   3.4   3.3  Average Risk       5.0   4.4  2 X Average Risk   9.6   7.1  3 X Average Risk  23.4   11.0        Use the calculated Patient Ratio above and the CHD Risk Table to determine the patient's CHD Risk.        ATP III CLASSIFICATION (LDL):  <100     mg/dL   Optimal  [id number]  mg/dL   Near or Above                    Optimal  130-159  mg/dL   Borderline  [id number]  mg/dL   High  >086>190     mg/dL   Very High [facility]formed at [facility]     Physical Findings: AIMS: Facial and Oral Movements Muscles of Facial Expression: None, normal Lips and Perioral Area: None, normal Jaw: None, normal Tongue: None, normal,Extremity Movements Upper (arms, wrists, hands, fingers): None, normal Lower (legs, knees, ankles, toes): None, normal, Trunk Movements Neck, shoulders, hips: None, normal, Overall Severity Severity of abnormal movements (highest score from questions above): None, normal Incapacitation due to abnormal movements: None, normal Patient's awareness of abnormal movements (rate only patient's report): No Awareness, Dental Status Current problems with teeth and/or dentures?: No Does patient usually wear dentures?: No  CIWA:  CIWA-Ar Total: 3 COWS:     Treatment Plan Summary: MDD (major depressive disorder), recurrent, severe, with psychosis, improving, managed as below:  Daily contact with patient to assess and evaluate symptoms and progress in treatment Medication management  Medications:  -Continue Celexa 10mg  for depression -Continue Seroquel 25mg  qhs  PRN insomnia secondary to anxiety/rumination -Continue Seroquel 25mg  bid for anxiety/paranoid ideation -Continue Vistaril 25mg  q6h PRN for breakthrough anxiety  Medical Decision Making Problem Points:  Established problem, stable/improving (1), Review of last therapy session (1) and Review of psycho-social stressors (1) Data Points:  Review of medication regiment & side effects (2) Review of new medications or change in dosage (2)  Withrow, Everardo AllJohn C, FNP-BC 01/02/2015, 2:11 PM I agree with assessment and plan Madie RenoIrving A. Dub MikesLugo, M.D.

## 2015-01-02 NOTE — Progress Notes (Signed)
Adult Psychoeducational Group Note  Date:  01/02/2015 Time:  2000  Group Topic/Focus:  Wrap-Up Group:   The focus of this group is to help patients review their daily goal of treatment and discuss progress on daily workbooks.  Participation Level:  Active  Participation Quality:  Appropriate  Affect:  Appropriate  Cognitive:  Appropriate  Insight: Appropriate  Engagement in Group:  Supportive  Modes of Intervention:  Support  Additional Comments:    Estevan OaksWhitaker, Shaun Runyon Shaunte 01/02/2015, 9:24 PM

## 2015-01-02 NOTE — [facility] Group Notes (Signed)
[facility] LCSW Group Therapy  01/02/2015 11:15 AM  Type of Therapy:  Group Therapy  Participation Level:  None   Summary of Progress/Problems: Topic for today was thoughts and feelings regarding discharge. We discussed fears of upcoming changes including judegements, expectations and stigma of mental health issues. We then discussed supports: what constitutes a supportive framework, identification of supports and what to do when others are not supportive. Patient's then identified a specific coping tool to use when others are not available.  Patient came into group room just when group was about to end.   Carney Bernatherine C Harrill, LCSW

## 2015-01-02 NOTE — Progress Notes (Signed)
Patient ID: Karen DrownMarissa J Kim, female   DOB: 1996/02/19, 19 y.o.   MRN: [id number] D: Client visible on the unit, watches TV in dayroom.. Reports anxiety "6" of 10. Reports Seroquel makes her groggy, also "making me really hungry, all the time" Client also talks about ex-BF, took an OD of Klonopin and used heroine, he begged me not to tell, so I took him to his mother's house where she lived and told her what happened. A: Writer provided emotional support, encouraged client to call for help when someone attempts suicide because they may not be as fortunate as ex-BF. Client encouraged to move forward from unhealthy relationships. Writer encouraged client to move forward with medication regime, reporting S.E. And attend group. R: Client is safe on the unit, attended group.

## 2015-01-02 NOTE — [facility] Group Notes (Signed)
[facility] Group Notes:  (Nursing/MHT/Case Management/Adjunct)  Date:  01/02/2015  Time:  12:36 PM  Type of Therapy:  Nurse Education  Participation Level:  Did Not Attend  Participation Quality:  did not attend  Affect:  did not attend  Cognitive:  did not attend  Insight:  None  Engagement in Group:  None  Modes of Intervention:  Discussion and Education  Summary of Progress/Problems: Patient was invited to group however did not attend   Ritesh Opara E 01/02/2015, 12:36 PM 

## 2015-01-02 NOTE — Plan of Care (Signed)
Problem: Diagnosis: Increased Risk For Suicide Attempt Goal: STG-Patient Will Comply With Medication Regime Outcome: Progressing Pt is safe and compliant with medication regime     

## 2015-01-02 NOTE — Progress Notes (Signed)
Pt presents with depressed mood, affect irritable. Upon interaction with writer she states '' I'm fine, but I really don't want to take the seroquel. It is making me too tired. '' Patient refused seroquel and held dose this am. Patient has been isolative to her room throughout shift thus far, she was encouraged to go to group and states '' oh no, I can't go cause I'm too tired. '' she did not go to breakfast this am. She denies any SI/HI/A/V Hallucinations with Clinical research associatewriter, but has had minimal interaction with Clinical research associatewriter or peers. She gives blunt one word answers and is not forthcoming of thoughts. She denies any other acute concerns. Support encouragement provided. Pt remains on q 15 minutes observation for safety. Pt is safe.

## 2015-01-03 DIAGNOSIS — Z8659 Personal history of other mental and behavioral disorders: Secondary | ICD-10-CM

## 2015-01-03 DIAGNOSIS — F333 Major depressive disorder, recurrent, severe with psychotic symptoms: Secondary | ICD-10-CM

## 2015-01-03 LAB — PROLACTIN: Prolactin: 37.3 ng/mL — ABNORMAL HIGH (ref 4.8–23.3)

## 2015-01-03 NOTE — Plan of Care (Signed)
Problem: Diagnosis: Increased Risk For Suicide Attempt Goal: STG-Patient Will Report Suicidal Feelings to Staff Outcome: Progressing Client is safe on the unit, AEB denies SI, room mate placed in room, door open, and safety maintained by q5615min safety checks.

## 2015-01-03 NOTE — Progress Notes (Signed)
Patient ID: Karen Kim, female   DOB: [birth year], 19 y.o.   MRN: [id number] [facility]  01/03/2015 6:39 PM Karen DrownMarissa J Vandevander  MRN:  [id number]  Subjective:  Karen Kim says, "I'm feeling a lot better now, when can I go home?".   Objective: Patient is seen, chart reviewed. Karen CordiaMarissa says she is feeling a lot better today. She is hoping to be discharged tomorrow. She says she had a lot going on in her life prior to her admission. She says her boyfriend relapsed on drugs, then attempted to kill himself. She adds that she felt like everything was crashing down on her, she got very depressed. She says she does not want to take Seroquel because it is making her feel too drowsy. Karen Kim also says her mother is upset with her for declining to be on Seroquel as she believed she needs the medication. She currently denies any new issues or concerns, She denies SIHI, AVH, delusional thoughts or paranoia. However, Karen CordiaMarissa continues to present with sad facial expression. She is active in group sessions.  Principal Diagnosis: MDD (major depressive disorder), recurrent, severe, with psychosis     Patient Active Problem List   Diagnosis Date Noted  . MDD (major depressive disorder), recurrent, severe, with psychosis 12/31/2014  . History of bipolar disorder 12/31/2014  . PTSD (post-traumatic stress disorder) 12/31/2014  . Altered mental status 05/23/2014  . Overdose 05/23/2014  . Metabolic encephalopathy 05/23/2014  . Hypotension, unspecified 01/17/2014  . Bradycardia 01/17/2014  . T wave inversion in EKG 01/17/2014  . Drug overdose 01/16/2014  . Gonorrhea    ADL's:  Intact  Sleep: Good  Appetite:  Good  Suicidal Ideation:  Denies   Homicidal Ideation:  Denies  Psychiatric Specialty Exam: Physical Exam  Review of Systems  Psychiatric/Behavioral: Positive for depression. The patient is nervous/anxious and has insomnia (improving).   All other systems reviewed and are negative.    Blood pressure 101/54, pulse 66, temperature 98.4 F (36.9 C), temperature source Oral, resp. rate 18, height 5\' 1"  (1.549 m), weight 78.019 kg (172 lb), last menstrual period 12/01/2014.Body mass index is 32.52 kg/(m^2).  General Appearance: Well Groomed  Patent attorneyye Contact::  Good  Speech:  Clear and Coherent  Volume:  Normal  Mood:  Euthymic  Affect:  Appropriate  Thought Process:  Intact  Orientation:  Full (Time, Place, and Person)  Thought Content:  WDL  Suicidal Thoughts:  No although minimizing  Homicidal Thoughts:  No  Memory:  Immediate;   Good Recent;   Good  Judgement:  Fair  Insight:  Fair  Psychomotor Activity:  Normal  Concentration:  Good  Recall:  Good  Fund of Knowledge:Fair  Language: Good  Akathisia:  No  Handed:    AIMS (if indicated):     Assets:  Manufacturing systems engineerCommunication Skills Physical Health Social Support  Sleep:  Number of Hours: 6.75   Musculoskeletal: Strength & Muscle Tone: within normal limits Gait & Station: normal Patient leans: N/A  Current Medications: Current Facility-Administered Medications  Medication Dose Route Frequency Provider Last Rate Last Dose  . acetaminophen (TYLENOL) tablet 650 mg  650 mg Oral Q6H PRN Earney NavyJosephine C Onuoha, NP   650 mg at 12/31/14 0647  . alum & mag hydroxide-simeth (MAALOX/MYLANTA) 200-200-20 MG/5ML suspension 30 mL  30 mL Oral Q4H PRN Earney NavyJosephine C Onuoha, NP      . citalopram (CELEXA) tablet 10 mg  10 mg Oral Daily Karen B Rankin, NP   10 mg at 01/03/15  1610  . feeding supplement (RESOURCE BREEZE) (RESOURCE BREEZE) liquid 1 Container  1 Container Oral BID BM Karen Kim, RD   1 Container at 01/02/15 [phone]  . hydrOXYzine (ATARAX/VISTARIL) tablet 25 mg  25 mg Oral Q6H PRN Karen Hough, PA-C   25 mg at 12/29/14 2310  . ibuprofen (ADVIL,MOTRIN) tablet 400 mg  400 mg Oral Q6H PRN Karen Brook, NP   400 mg at 01/01/15 2031  . magnesium hydroxide (MILK OF MAGNESIA) suspension 30 mL  30 mL Oral Daily PRN Earney Navy, NP      . QUEtiapine (SEROQUEL) tablet 25 mg  25 mg Oral BID Karen Fanny, FNP   25 mg at 01/01/15 2032  . QUEtiapine (SEROQUEL) tablet 25 mg  25 mg Oral QHS,MR X 1 Karen Fanny, FNP   25 mg at 01/01/15 2200    Lab Results:  No results found for this or any previous visit (from the past 48 hour(s)).  Physical Findings: AIMS: Facial and Oral Movements Muscles of Facial Expression: None, normal Lips and Perioral Area: None, normal Jaw: None, normal Tongue: None, normal,Extremity Movements Upper (arms, wrists, hands, fingers): None, normal Lower (legs, knees, ankles, toes): None, normal, Trunk Movements Neck, shoulders, hips: None, normal, Overall Severity Severity of abnormal movements (highest score from questions above): None, normal Incapacitation due to abnormal movements: None, normal Patient's awareness of abnormal movements (rate only patient's report): No Awareness, Dental Status Current problems with teeth and/or dentures?: No Does patient usually wear dentures?: No  CIWA:  CIWA-Ar Total: 1 COWS:  COWS Total Score: 1  Treatment Plan Summary: MDD (major depressive disorder), recurrent, severe, with psychosis, improving, managed as below:  Daily contact with patient to assess and evaluate symptoms and progress in treatment Medication management; Will continue Celexa  for depression. Will discontinue Seroquel  bid for anxiety/paranoid ideation, patient declines to take, Continue Vistaril  q6h PRN for breakthrough anxiety  Medical Decision Making Problem Points:  Established problem, stable/improving (1), Review of last therapy session (1) and Review of psycho-social stressors (1) Data Points:  Review of medication regiment & side effects (2) Review of new medications or change in dosage (2)  [name], PMHNP, FNP-BC 01/03/2015, 6:39 PM I agree with assessment and plan Madie Reno A. Dub Mikes, M.D.

## 2015-01-03 NOTE — [facility] Group Notes (Signed)
   [facility]  01/03/2015  8:45 AM   Participation Quality: Alert, Appropriate and Oriented  Mood/Affect: Depressed and Flat  Depression Rating: Did not want to rate  Anxiety Rating: Did not want to rate  Thoughts of Suicide: Pt denies SI/HI  Will you contract for safety? Yes  Current AVH: Pt denies  Plan for Discharge/Comments: Pt attended discharge planning group and actively participated in group. CSW provided pt with today's workbook. Patient reported that she was "not good" this morning and did not want to discuss her symptoms at this time.  Transportation Means: Pt reports access to transportation  Supports: No supports mentioned at this time  Samuella BruinKristin Aamina Skiff, MSW, [employer] IncLCSWA Clinical Social Worker [facility] [phone]

## 2015-01-03 NOTE — Progress Notes (Addendum)
Patient refused seroquel this morning, stated this med makes her feel too drowsy/sleepy.  D:  Patient denied SI and HI, contracts for safety.  Denied A/V hallucinations.  Denied pain. A:  Patient refused seroquel this morning, did not her celexa medication. R:  Safety maintained with 15 minute checks.  Patient's self inventory sheet, patient has fair sleep, sleep medication was administered.  Fair appetite, normal energy level, good concentration.  Rated depression 5, denied hopeless, anxiety #8.  Denied withdrawals.  Denied physical problems.  Denied pain.

## 2015-01-03 NOTE — Plan of Care (Signed)
Problem: Consults Goal: Depression Patient Education See Patient Education Module for education specifics.  Outcome: Progressing Nurse discussed depression/coping skills with patient.        

## 2015-01-04 LAB — HEMOGLOBIN A1C
Hgb A1c MFr Bld: 5.3 % (ref 4.8–5.6)
Mean Plasma Glucose: 105 mg/dL

## 2015-01-04 MED ORDER — HYDROXYZINE HCL 25 MG PO TABS: 25.0000 mg | ORAL_TABLET | Freq: Four times a day (QID) | ORAL | Status: DC | PRN

## 2015-01-04 MED ORDER — CITALOPRAM HYDROBROMIDE 10 MG PO TABS: 10.0000 mg | ORAL_TABLET | Freq: Every day | ORAL | Status: DC

## 2015-01-04 NOTE — Progress Notes (Signed)
D: Pt complained of severe depression and anger.  Pt feels that her mom does not understand her psychiatric condition; she states, "I was happy when my mom and aunt came to visit but they got me crying before they left.  I don't think my mom understanding what I am going through" Pt also does not want to take her Seroquel because it makes her drowsy and groggy all day.  Pt at this time denies SI/HI A: Medications administered as prescribed.  Support, encouragement, and safe environment provided.  15-minute safety checks continue. R: Pt was med compliant.  Safety checks continue.

## 2015-01-04 NOTE — [facility] Group Notes (Signed)
The focus of this group is to educate the patient on the purpose and policies of crisis stabilization and provide a format to answer questions about their admission.  The group details unit policies and expectations of patients while admitted.  Patient did not attend 0900 nurse education orientation group this morning.  Patient stayed in bed.   

## 2015-01-04 NOTE — Progress Notes (Signed)
DISCHARGE NOTE  Pt discharged home with mother. Denies SI/HI/AVH. Pt belonging were returned with patient; Pt states she was grateful for care given.

## 2015-01-04 NOTE — Progress Notes (Signed)
Recreation Therapy Notes  Animal-Assisted Activity (AAA) Program Checklist/Progress Notes Patient Eligibility Criteria Checklist & Daily Group note for Rec Tx Intervention  Date: 07.05.16 Time: 230 pm Location: 400 Hall Dayroom   AAA/T Program Assumption of Risk Form signed by Patient/ or Parent Legal Guardian yes  Patient is free of allergies or sever asthma yes  Patient reports no fear of animals yes  Patient reports no history of cruelty to animalsyes  Patient understands his/her participation is voluntary yes  Patient washes hands before animal contact yes  Patient washes hands after animal contact yes  Behavioral Response:  Engaged  Education: Hand Washing, Appropriate Animal Interaction   Education Outcome: Acknowledges understanding/In group clarification offered/Needs additional education.   Clinical Observations/Feedback: Patient attended group.   Karen Kim, LRT/CTRS         Karen Kim 01/04/2015 4:50 PM 

## 2015-01-04 NOTE — Tx Team (Signed)
Interdisciplinary Treatment Plan Update (Adult) Date: 01/04/2015   Time Reviewed: 9:30 AM  Progress in Treatment: Attending groups: Minimally Participating in groups: Minimally Taking medication as prescribed: Yes Tolerating medication: Yes Family/Significant other contact made: Yes, CSW has spoken with mother Patient understands diagnosis: Yes Discussing patient identified problems/goals with staff: Yes Medical problems stabilized or resolved: Yes Denies suicidal/homicidal ideation: Yes Issues/concerns per patient self-inventory: Yes Other:  New problem(s) identified: N/A  Discharge Plan or Barriers:  7/5: Patient plans to return home to follow up with outpatient servies.  Reason for Continuation of Hospitalization:  Depression Anxiety Medication Stabilization   Comments: N/A  Estimated length of stay: Discharge anticipated for today 7/5  For review of initial/current patient goals, please see plan of care. Patient is a 19 year old [place], [place] with her mother, stepfather, and two siblings. [facility] and SI due to relationship issues and feeling unsupported by family. [facility], most recent admission in 05/2014. Patient plans to return home to follow up with outpatient services. Recommendations for pt include: crisis stabilization, therapeutic milieu, encourage group attendance and participation, medication management for mood stabilization, and development of comprehensive mental wellness plan.    Attendees: Patient:    Family:    Physician: Dr. Jama Flavorsobos; Dr. Dub MikesLugo 01/04/2015 9:30 AM  Nursing: Dellia CloudAndrea Thorne, Quintella ReichertBeverly Knight, Vivi FernsAshley Strader, [place]usPatrice White, RN 01/04/2015 9:30 AM  Clinical Social Worker: Samuella BruinKristin Gwendlyn Hanback, LCSWA 01/04/2015 9:30 AM  Other: Loralie ChampagneAnne Cunningam, LCSW 01/04/2015 9:30 AM  Other: Leisa LenzValerie Enoch, Vesta MixerMonarch Liaison 01/04/2015 9:30 AM   Other: Onnie BoerJennifer Clark, Case Manager 01/04/2015 9:30 AM  Other: Mosetta AnisAggie Nwoko, Laura Davis, NP 01/04/2015 9:30 AM  Other:            Scribe for Treatment Team:  Samuella BruinKristin Merrit Friesen, MSW, LCSWA [phone]

## 2015-01-04 NOTE — Progress Notes (Signed)
  [facility] Discharge Plan :  Will you be returning to the same living situation after discharge:  Yes,  Patient plans to return home with her family At discharge, do you have transportation home?: Yes,  patient's mother will provide transportation Do you have the ability to pay for your medications: Yes,  patient will be provided with prescriptions at discharge  Release of information consent forms completed and in the chart;  Patient's signature needed at discharge.  Patient to Follow up at: Follow-up Information    Follow up with [facility].   Why:  Please contact at discharge to schedule appointment for medication management and therapy services. Referral sent on 01/04/15.   Contact information:   [address] [place], [place] [address] Phone [phone] Fax [phone]      Patient denies SI/HI: Yes,  denies    Safety Planning and Suicide Prevention discussed: Yes,  with patient and mother  Have you used any form of tobacco in the last 30 days? (Cigarettes, Smokeless Tobacco, Cigars, and/or Pipes): No  Has patient been referred to the Quitline?: N/A patient is not a smoker  Trason Shifflet, Belenda CruiseKristin L 01/04/2015, 11:39 AM

## 2015-01-04 NOTE — Discharge Summary (Signed)
Physician Discharge Summary Note  Patient:  Karen Kim is an 19 y.o., female MRN:  [id number] DOB:  03/10/1996 Patient phone:  [phone] (home)  Patient address:   [address] [place] [address],  Total Time spent with patient: Greater than 30 minutes  Date of Admission:  12/29/2014  Date of Discharge: Greater than 30 minutes  Reason for Admission:  Suicidal ideation/worsening symptoms of depression  Principal Problem: MDD (major depressive disorder), recurrent, severe, with psychosis Discharge Diagnoses: Patient Active Problem List   Diagnosis Date Noted  . MDD (major depressive disorder), recurrent, severe, with psychosis [F33.3] 12/31/2014  . History of bipolar disorder [Z86.59] 12/31/2014  . PTSD (post-traumatic stress disorder) [F43.10] 12/31/2014  . Altered mental status [R41.82] 05/23/2014  . Overdose [T50.901A] 05/23/2014  . Metabolic encephalopathy [G93.41] 05/23/2014  . Hypotension, unspecified [I95.9] 01/17/2014  . Bradycardia [R00.1] 01/17/2014  . T wave inversion in EKG [R94.31] 01/17/2014  . Drug overdose [T50.901A] 01/16/2014  . Gonorrhea [A54.9]    Musculoskeletal: Strength & Muscle Tone: within normal limits Gait & Station: normal Patient leans: N/A  Psychiatric Specialty Exam: Physical Exam  Psychiatric: Her speech is normal and behavior is normal. Judgment and thought content normal. Her mood appears not anxious. Her affect is not angry, not blunt, not labile and not inappropriate. Cognition and memory are normal. She does not exhibit a depressed mood.    Review of Systems  Constitutional: Negative.   HENT: Negative.   Eyes: Negative.   Respiratory: Negative.   Cardiovascular: Negative.   Gastrointestinal: Negative.   Genitourinary: Negative.   Musculoskeletal: Negative.   Skin: Negative.   Neurological: Negative.   Endo/Heme/Allergies: Negative.   Psychiatric/Behavioral: Positive for depression (Stable). Negative for  suicidal ideas, hallucinations, memory loss and substance abuse. The patient has insomnia (Stable). The patient is not nervous/anxious.     Blood pressure 106/59, pulse 91, temperature 98.2 F (36.8 C), temperature source Oral, resp. rate 18, height  (1.549 m), weight 78.019 kg (172 lb), last menstrual period 12/01/2014.Body mass index is 32.52 kg/(m^2).  See Md's SRA   Have you used any form of tobacco in the last 30 days? (Cigarettes, Smokeless Tobacco, Cigars, and/or Pipes): No  Has this patient used any form of tobacco in the last 30 days? (Cigarettes, Smokeless Tobacco, Cigars, and/or Pipes) No  Past Medical History:  Past Medical History  Diagnosis Date  . Scoliosis   . MRSA (methicillin resistant staph aureus) culture positive   . Bipolar affective   . Depression   . Gonorrhea     Past Surgical History  Procedure Laterality Date  . Back surgery  2012    spinal fusion  . Back surgery  2012    MRSA infection- rods removed.  . Back surgery  2013    Rods put back in   Family History:  Family History  Problem Relation Age of Onset  . Suicidality Father     Committed suicide   Social History:  History  Alcohol Use  . Yes    Comment: occasionally     History  Drug Use  . Yes  . Special: Marijuana    Comment: couple times a month    History   Social History  . Marital Status: Single    Spouse Name: N/A  . Number of Children: N/A  . Years of Education: 12   Occupational History  . Unemployed    Social History Main Topics  . Smoking status: Former Games developer  .  Smokeless tobacco: Never Used  . Alcohol Use: Yes     Comment: occasionally  . Drug Use: Yes    Special: Marijuana     Comment: couple times a month  . Sexual Activity: Yes    Birth Control/ Protection: None   Other Topics Concern  . None   Social History Narrative   Lives with mother. Single. Graduated high school. Currently unemployed.    Risk to Self: Is patient at risk for suicide?:  No Risk to Others: No Prior Inpatient Therapy: Yes Prior Outpatient Therapy: Yes  Level of Care:  OP  Hospital Course:  Patient states that she called 911 because she needed help and was brought to the hospital. Patient had recent break up with boy friend & had moved back in with her parent who she said has been arguing with & she felt belittled. Yesterday she felt that she and her boyfriend had a chance to get back together but they didn't as he started to have suicidal thoughts and feelings of hopelessness. Karen Kim says as was told by her parents that if she left to go back to her boyfriend that she couldn't come back home. Patient has history or 3 prior suicide attempts via overdose and prior inpatient treatment.Patient states that she was taking medication and stopped abruptly and experienced pain, depression, and suicidal thoughts last year and hasn't taken any medication since. Outpatient provider was Dr. Lucianne Muss and patient was being treated with Lamictal. Patient states that she didn't feel that the medication was working that is why she stopped.  Karen Kim was admitted to the adult unit for worsening symptoms of depression related to relationship issues & familial stressors. She reported also that her previous medication in use was not helping her symptoms. During her admission assessment, she was evaluated and her symptoms were identified. Medication management was discussed and initiated targeting those presenting symptoms. She was oriented to the unit and encouraged to participate in unit programming. She presented no other pre-existing medical problems that required treatment & or monitoring. Karen Kim tolerated her treatment regimen without any significant adverse effects or reactions.          During her present hospital stay, Karen Kim was evaluated each day by a clinical provider to ascertain her response to her treatment regimen. Medication changes & adjustments were made according to  need. As the day goes by, improvement was noted as evidence by her reports of decreasing symptoms, improved sleep, appetite, affect, medication tolerance, behavior, and participation in the unit programming.  She was required on daily basis to complete a self inventory asssessment noting mood, mental status, pain, new symptoms, anxiety and concerns. Her symptoms responded well to his treatment regimen, being in a therapeutic & supportive environment also assisted in her mood stability. Karen Kim did present appropriate behavior & was motivated for recovery. She worked closely with the treatment team and case manager to develop a discharge plan with appropriate goals to maintain mood stability after discharge. Coping skills, problem solving as well as relaxation therapies were also part of the unit programming.  On this day of her discharge, Karen Kim is in much improved condition than upon admission. Her symptoms were reported as significantly decreased or resolved completely. Upon discharge, she adamantly denies SI/HI and voiced no AVH. She was motivated to continue taking medication with a goal of continued improvement in mental health. Karen Kim was medicated & discharge on; Citalopram 10 mg for depression & Hydroxyzine 25 mg prn for anxiety. She is discharged to  follow-up care for routine psychiatric care & medication management as noted below. She is provided with all the necessary information needed to make this appointment without problems. She was provided with a 14 days worth, supply samples [facility]. Karen Kim left [facility] in no apparent distress. Transportation per mother.  Consults:  psychiatry  Significant Diagnostic Studies:  labs: CBC with diff, CMP, UDS, toxicology tests, U/A, results reviewed, stable  Discharge Vitals:   Blood pressure 106/59, pulse 91, temperature 98.2 F (36.8 C), temperature source Oral, resp. rate 18, height 5\' 1"  (1.549 m), weight 78.019 kg (172 lb), last  menstrual period 12/01/2014. Body mass index is 32.52 kg/(m^2). Lab Results:   No results found for this or any previous visit (from the past 72 hour(s)).  Physical Findings: AIMS: Facial and Oral Movements Muscles of Facial Expression: None, normal Lips and Perioral Area: None, normal Jaw: None, normal Tongue: None, normal,Extremity Movements Upper (arms, wrists, hands, fingers): None, normal Lower (legs, knees, ankles, toes): None, normal, Trunk Movements Neck, shoulders, hips: None, normal, Overall Severity Severity of abnormal movements (highest score from questions above): None, normal Incapacitation due to abnormal movements: None, normal Patient's awareness of abnormal movements (rate only patient's report): No Awareness, Dental Status Current problems with teeth and/or dentures?: No Does patient usually wear dentures?: No  CIWA:  CIWA-Ar Total: 1 COWS:  COWS Total Score: 2  See Psychiatric Specialty Exam and Suicide Risk Assessment completed by Attending Physician prior to discharge.  Discharge destination:  Home  Is patient on multiple antipsychotic therapies at discharge:  No   Has Patient had three or more failed trials of antipsychotic monotherapy by history:  No  Recommended Plan for Multiple Antipsychotic Therapies: NA    Medication List    STOP taking these medications        acyclovir 800 MG tablet  Commonly known as:  ZOVIRAX     HYDROcodone-acetaminophen 5-325 MG per tablet  Commonly known as:  NORCO/VICODIN     lamoTRIgine 25 MG tablet  Commonly known as:  LAMICTAL      TAKE these medications      Indication   citalopram 10 MG tablet  Commonly known as:  CELEXA  Take 1 tablet (10 mg total) by mouth daily. For depression   Indication:  Depression     hydrOXYzine 25 MG tablet  Commonly known as:  ATARAX/VISTARIL  Take 1 tablet (25 mg total) by mouth every 6 (six) hours as needed for anxiety.   Indication:  Anxiety       Follow-up  Information    Follow up with [facility].   Why:  Please contact at discharge to schedule appointment for medication management and therapy services. Referral sent on 01/04/15.   Contact information:   [address] [place], [place] [address] Phone [phone] Fax [phone]) [phone]     Follow-up recommendations: Activity:  As tolerated Diet: As recommended by your primary care doctor. Keep all scheduled follow-up appointments as recommended.    Comments: Take all your medications as prescribed by your mental healthcare provider. Report any adverse effects and or reactions from your medicines to your outpatient provider promptly. Patient is instructed and cautioned to not engage in alcohol and or illegal drug use while on prescription medicines. In the event of worsening symptoms, patient is instructed to call the crisis hotline, 911 and or go to the nearest ED for appropriate evaluation and treatment of symptoms. Follow-up with your primary care provider  for your other medical issues, concerns and or health care needs.   Total Discharge Time: Greater than 30 minutes  Signed: [name], PMHNP, FNP-BC 01/10/2015, 9:29 AM  Patient seen, Suicide Assessment Completed.  Disposition Plan Reviewed

## 2015-01-04 NOTE — Progress Notes (Deleted)
Adult Psychoeducational Group Note  Date:  01/04/2015 Time:  11:09 AM  Group Topic/Focus:  Recovery Goals:   The focus of this group is to identify appropriate goals for recovery and establish a plan to achieve them.  Participation Level:  Active  Participation Quality:  Appropriate  Affect:  Appropriate  Cognitive:  Appropriate  Insight: Good  Engagement in Group:  Engaged  Modes of Intervention:  Discussion   Additional Comments:  Pt attended recovery group this morning. Pt participate in group. Pt was able to share in group.    Lex Linhares A 01/04/2015, 11:09 AM

## 2015-01-04 NOTE — [facility] Suicide Risk Assessment (Signed)
[facility] INPATIENT:  Family/Significant Other Suicide Prevention Education  Suicide Prevention Education:  Education Completed; Mother Paulino Rilyngie Odonoghue [phone],  (name of family member/significant other) has been identified by the patient as the family member/significant other with whom the patient will be residing, and identified as the person(s) who will aid the patient in the event of a mental health crisis (suicidal ideations/suicide attempt).  With written consent from the patient, the family member/significant other has been provided the following suicide prevention education, prior to the and/or following the discharge of the patient.  The suicide prevention education provided includes the following:  Suicide risk factors  Suicide prevention and interventions  National Suicide Hotline telephone number  [facility] assessment telephone number  [facility] Emergency Assistance 911  [facility] and/or Residential Mobile Crisis Unit telephone number  Request made of family/significant other to:  Remove weapons (e.g., guns, rifles, knives), all items previously/currently identified as safety concern.    Remove drugs/medications (over-the-counter, prescriptions, illicit drugs), all items previously/currently identified as a safety concern.  The family member/significant other verbalizes understanding of the suicide prevention education information provided.  The family member/significant other agrees to remove the items of safety concern listed above.  Kendal Ghazarian, [place] CarboKristin L 01/04/2015, 11:27 AM

## 2015-01-04 NOTE — Plan of Care (Signed)
Problem: Consults Goal: Depression Patient Education See Patient Education Module for education specifics.  Outcome: Progressing Nurse discussed depression/coping skills with patient.        

## 2015-01-04 NOTE — [facility] Suicide Risk Assessment (Signed)
[facility]   Demographic Factors:  19 year old single female, currently unemployed, lives with mother   Total Time spent with patient: 30 minutes  Musculoskeletal: Strength & Muscle Tone: within normal limits Gait & Station: normal Patient leans: N/A  Psychiatric Specialty Exam: Physical Exam  ROS  Blood pressure 106/59, pulse 91, temperature 98.2 F (36.8 C), temperature source Oral, resp. rate 18, height 5\' 1"  (1.549 m), weight 172 lb (78.019 kg), last menstrual period 12/01/2014.Body mass index is 32.52 kg/(m^2).  General Appearance: Well Groomed  Eye Contact::  Good  Speech:  Normal Rate409  Volume:  Normal  Mood:  denies depression, states mood back to " normal"  Affect:  Appropriate  Thought Process:  Linear  Orientation:  Full (Time, Place, and Person)  Thought Content:  denies hallucinations, no delusions   Suicidal Thoughts:  No denies any  Current thoughts of hurting herself or of suicide   Homicidal Thoughts:  No  Memory:  recent and remote grossly intact   Judgement:  Other:  improved   Insight:  Present  Psychomotor Activity:  Normal  Concentration:  Good  Recall:  Good  Fund of Knowledge:Good  Language: NA  Akathisia:  Negative  Handed:  Left  AIMS (if indicated):     Assets:  Communication Skills Desire for Improvement Housing Resilience  Sleep:  Number of Hours: 6.75  Cognition: WNL  ADL's:  Improved    Have you used any form of tobacco in the last 30 days? (Cigarettes, Smokeless Tobacco, Cigars, and/or Pipes): No  Has this patient used any form of tobacco in the last 30 days? (Cigarettes, Smokeless Tobacco, Cigars, and/or Pipes) No  Mental Status Per Nursing Assessment::   On Admission:  Self-harm thoughts  Current Mental Status by Physician: Alert and attentive, well related, mood improved, and at this time denies depression/sadness, affect is appropriate, no thought disorder, no SI, no HI, no psychotic symptoms,  future oriented   Loss Factors: Recent break up with boyfriend, unemployment   Historical Factors: History of prior psychiatric admissions, history of anxiety, depression. History of prior suicide attempts .  Risk Reduction Factors:   Sense of responsibility to family, Living with another person, especially a relative and Positive coping skills or problem solving skills  Continued Clinical Symptoms:  As noted, at this time significantly improved , and at this time describes mood as normal, no SI or HI. Future oriented, states she wants to go to college . Also states she is feeling better after recent termination of relationship- states " it ws a toxic relationship, and it was preventing me from liking myself ".   Cognitive Features That Contribute To Risk:  No gross cognitive deficits noted upon discharge. Is alert , attentive, and oriented x 3   Suicide Risk:  Mild:  Suicidal ideation of limited frequency, intensity, duration, and specificity.  There are no identifiable plans, no associated intent, mild dysphoria and related symptoms, good self-control (both objective and subjective assessment), few other risk factors, and identifiable protective factors, including available and accessible social support.  Principal Problem: MDD (major depressive disorder), recurrent, severe, with psychosis Discharge Diagnoses:  Patient Active Problem List   Diagnosis Date Noted  . MDD (major depressive disorder), recurrent, severe, with psychosis [F33.3] 12/31/2014  . History of bipolar disorder [Z86.59] 12/31/2014  . PTSD (post-traumatic stress disorder) [F43.10] 12/31/2014  . Altered mental status [R41.82] 05/23/2014  . Overdose [T50.901A] 05/23/2014  . Metabolic encephalopathy [G93.41] 05/23/2014  .  Hypotension, unspecified [I95.9] 01/17/2014  . Bradycardia [R00.1] 01/17/2014  . T wave inversion in EKG [R94.31] 01/17/2014  . Drug overdose [T50.901A] 01/16/2014  . Gonorrhea [A54.9]      Follow-up Information    Follow up with [facility].   Why:  Please contact at discharge to schedule appointment for medication management and therapy services. Referral sent on 01/04/15.   Contact information:   [address] [place], [place] [address] Phone [phone] Fax [phone]      Plan Of Care/Follow-up recommendations:  Activity:  as tolerated  Diet:  Regular Tests:  NA Other:  See below   Is patient on multiple antipsychotic therapies at discharge:  No   Has Patient had three or more failed trials of antipsychotic monotherapy by history:  No  Recommended Plan for Multiple Antipsychotic Therapies: NA   Patient is leaving unit in good spirits. Plans to return home- lives with mother, mother will pick her up later today. Plans to follow up as above .    Ashley Bultema 01/04/2015, 4:15 PM

## 2015-01-04 NOTE — [facility] Group Notes (Signed)
[facility] LCSW Group Therapy 01/04/2015  1:15 PM   Type of Therapy: Group Therapy  Participation Level: Did Not Attend. Patient invited to participate but declined.   Berneita Sanagustin, MSW, LCSWA Clinical Social Worker [facility] [phone]   

## 2015-01-04 NOTE — Progress Notes (Signed)
Adult Psychoeducational Group Note  Date:  01/04/2015 Time:  10:46 PM  Group Topic/Focus:  Wrap-Up Group:   The focus of this group is to help patients review their daily goal of treatment and discuss progress on daily workbooks.  Participation Level:  Active  Participation Quality:  Appropriate  Affect:  Appropriate  Cognitive:  Appropriate  Insight: Appropriate  Engagement in Group:  Engaged  Modes of Intervention:  Support  Additional Comments:  Patient expressed she had a good day and that she was just ready to leave tonight and prepare for discharge. She rated her day a 9.  Natasha MeadKiara M Welborn Keena 01/04/2015, 10:46 PM

## 2015-01-04 NOTE — Clinical Social Work Note (Signed)
Mother reports that she can pick patient up at 9 pm if patient discharges today. RN updated.  Samuella BruinKristin Shanell Aden, MSW, [employer]LCSWA Clinical Social Worker [facility] [phone]

## 2015-01-04 NOTE — Progress Notes (Signed)
D:  Patient denied SI and HI, contracts for safety.  Denied A/V hallucinations.  Denied pain. A:  Patient took her celexa this morning, declined seroquel because it makes her feel drowsy.  Emotional support and encouragement given patient. R:  Safety maintained with 15 minute checks.

## 2015-01-11 LAB — URINE CYTOLOGY ANCILLARY ONLY
Chlamydia: NEGATIVE
Neisseria Gonorrhea: NEGATIVE

## 2015-01-12 ENCOUNTER — Encounter (HOSPITAL_COMMUNITY): Payer: Self-pay

## 2015-01-12 ENCOUNTER — Emergency Department (HOSPITAL_COMMUNITY)
Admission: EM | Admit: 2015-01-12 | Discharge: 2015-01-13 | Disposition: A | Discharge status: Other Acute Inpatient Hospital | Payer: 59 | Attending: Emergency Medicine | Admitting: Emergency Medicine

## 2015-01-12 DIAGNOSIS — Z79899 Other long term (current) drug therapy: Secondary | ICD-10-CM | POA: Insufficient documentation

## 2015-01-12 DIAGNOSIS — Z87891 Personal history of nicotine dependence: Secondary | ICD-10-CM | POA: Diagnosis not present

## 2015-01-12 DIAGNOSIS — Z8614 Personal history of Methicillin resistant Staphylococcus aureus infection: Secondary | ICD-10-CM | POA: Insufficient documentation

## 2015-01-12 DIAGNOSIS — R45851 Suicidal ideations: Secondary | ICD-10-CM | POA: Diagnosis not present

## 2015-01-12 DIAGNOSIS — Z8619 Personal history of other infectious and parasitic diseases: Secondary | ICD-10-CM | POA: Diagnosis not present

## 2015-01-12 DIAGNOSIS — F319 Bipolar disorder, unspecified: Secondary | ICD-10-CM | POA: Diagnosis not present

## 2015-01-12 DIAGNOSIS — F332 Major depressive disorder, recurrent severe without psychotic features: Secondary | ICD-10-CM | POA: Diagnosis not present

## 2015-01-12 DIAGNOSIS — Z3202 Encounter for pregnancy test, result negative: Secondary | ICD-10-CM | POA: Diagnosis not present

## 2015-01-12 LAB — COMPREHENSIVE METABOLIC PANEL
ALBUMIN: 4.1 g/dL (ref 3.5–5.0)
ALT: 13 U/L — ABNORMAL LOW (ref 14–54)
ANION GAP: 11 (ref 5–15)
AST: 22 U/L (ref 15–41)
Alkaline Phosphatase: 50 U/L (ref 38–126)
BILIRUBIN TOTAL: 1.1 mg/dL (ref 0.3–1.2)
BUN: 14 mg/dL (ref 6–20)
CALCIUM: 9.2 mg/dL (ref 8.9–10.3)
CHLORIDE: 104 mmol/L (ref 101–111)
CO2: 22 mmol/L (ref 22–32)
CREATININE: 0.53 mg/dL (ref 0.44–1.00)
GFR calc non Af Amer: >60 mL/min (ref >60)
GLUCOSE: 71 mg/dL (ref 65–99)
POTASSIUM: 3.4 mmol/L — AB (ref 3.5–5.1)
Sodium: 137 mmol/L (ref 135–145)
TOTAL PROTEIN: 7.8 g/dL (ref 6.5–8.1)

## 2015-01-12 LAB — RAPID URINE DRUG SCREEN, HOSP PERFORMED
Amphetamines: NOT DETECTED
BARBITURATES: NOT DETECTED
BENZODIAZEPINES: NOT DETECTED
Cocaine: NOT DETECTED
Opiates: NOT DETECTED
Tetrahydrocannabinol: NOT DETECTED

## 2015-01-12 LAB — ETHANOL: Alcohol, Ethyl (B): 77 mg/dL — ABNORMAL HIGH (ref <5)

## 2015-01-12 LAB — CBC
HEMATOCRIT: 37.9 % (ref 36.0–46.0)
Hemoglobin: 12.3 g/dL (ref 12.0–15.0)
MCH: 28.9 pg (ref 26.0–34.0)
MCHC: 32.5 g/dL (ref 30.0–36.0)
MCV: 89.2 fL (ref 78.0–100.0)
PLATELETS: 279 10*3/uL (ref 150–400)
RBC: 4.25 MIL/uL (ref 3.87–5.11)
RDW: 13.5 % (ref 11.5–15.5)
WBC: 8 10*3/uL (ref 4.0–10.5)

## 2015-01-12 LAB — ACETAMINOPHEN LEVEL: Acetaminophen (Tylenol), Serum: <10 ug/mL — ABNORMAL LOW (ref 10–30)

## 2015-01-12 LAB — PREGNANCY, URINE: PREG TEST UR: NEGATIVE

## 2015-01-12 LAB — SALICYLATE LEVEL: Salicylate Lvl: <4 mg/dL (ref 2.8–30.0)

## 2015-01-12 NOTE — ED Notes (Signed)
Pt states suicidal thoughts but is denying taking meds today.

## 2015-01-12 NOTE — ED Notes (Signed)
Pt AAO x 3, no distress noted, presents with SI no plan.  History of previous attempts.  Feeling hopeless  Denies HI or AV hallucinations.  Pt reports she is requesting long term treatment for depression. Reports she has had 3-4 therapists and never returns to them for treatment.  Pt also states the home situation with the stepdad is not good.  Monitoring for safety, Q 15 min checks in effect.

## 2015-01-12 NOTE — ED Notes (Signed)
Pt is distraught over loosing her job even though she had planned to leave her job anyway. Pt stated she decided to drink alcohol until she died. Pt appears very blunted. She stated her family never gets along. She has passive SI but does contract for safety.
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
## 2015-01-12 NOTE — ED Provider Notes (Signed)
CSN: [id number]     Arrival date & time 01/12/15  1609 History   First MD Initiated Contact with Patient 01/12/15 1733     Chief Complaint  Patient presents with  . Alcohol Intoxication  . Suicidal     (Consider location/radiation/quality/duration/timing/severity/associated sxs/prior Treatment) HPI Comments: The patient is a 19 year old female, she has a history of a total health disorders including depression and has had suicidal ideation with multiple attempts in the past including overdose of pills 3 or 4 times. She states that today she was drinking heavy amounts of alcohol with an intent to kill herself. She was going to take an overdose of pills but her brother called the police department first. She states that she does not feel any physical complaints, she does not have any hallucinations, she does have active suicidal ideation but refuses to tell me what the source of this is. 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  Patient is a 19 y.o. female presenting with intoxication. The history is provided by the patient.  Alcohol Intoxication    Past Medical History  Diagnosis Date  . Scoliosis   . MRSA (methicillin resistant staph aureus) culture positive   . Bipolar affective   . Depression   . Gonorrhea    Past Surgical History  Procedure Laterality Date  . Back surgery  2012    spinal fusion  . Back surgery  2012    MRSA infection- rods removed.  . Back surgery  2013    Rods put back in   Family History  Problem Relation Age of Onset  . Suicidality Father     Committed suicide   History  Substance Use Topics  . Smoking status: Former Games developer  . Smokeless tobacco: Never Used  . Alcohol Use: Yes     Comment: occasionally   OB History    No data available     Review of Systems  All other systems reviewed and are negative.     Allergies  Review of patient's allergies  indicates no known allergies.  Home Medications   Prior to Admission medications   Medication Sig Start Date End Date Taking? Authorizing Provider  citalopram (CELEXA) 10 MG tablet Take 1 tablet (10 mg total) by mouth daily. For depression 01/04/15  Yes [name], NP  hydrOXYzine (ATARAX/VISTARIL) 25 MG tablet Take 1 tablet (25 mg total) by mouth every 6 (six) hours as needed for anxiety. 01/04/15  Yes [name], NP   BP 98/35 mmHg  Pulse 70  Temp(Src) 97.7 F (36.5 C) (Oral)  Resp 18  SpO2 100%  LMP 12/01/2014 Physical Exam  Constitutional: She appears well-developed and well-nourished. No distress.  HENT:  Head: Normocephalic and atraumatic.  Mouth/Throat: Oropharynx is clear and moist. No oropharyngeal exudate.  Eyes: Conjunctivae and EOM are normal. Pupils are equal, round, and reactive to light. Right eye exhibits no discharge. Left eye exhibits no discharge. No scleral icterus.  Neck: Normal range of motion. Neck supple. No JVD present. No thyromegaly present.  Cardiovascular: Normal rate, regular rhythm, normal heart sounds and intact distal pulses.  Exam reveals no gallop and no friction rub.   No murmur heard. Pulmonary/Chest: Effort normal and breath sounds normal. No respiratory distress. She has no wheezes. She has no rales.  Abdominal: Soft. Bowel sounds are normal. She exhibits no distension and no mass. There is  no tenderness.  Musculoskeletal: Normal range of motion. She exhibits no edema or tenderness.  Lymphadenopathy:    She has no cervical adenopathy.  Neurological: She is alert. Coordination normal.  Skin: Skin is warm and dry. No rash noted. No erythema.  Psychiatric:  Flat affect, not responding to internal stimuli, actively suicidal  Nursing note and vitals reviewed.   ED Course  Procedures (including critical care time) Labs Review Labs Reviewed  COMPREHENSIVE METABOLIC PANEL - Abnormal; Notable for the following:    Potassium 3.4 (*)    ALT 13  (*)    All other components within normal limits  ETHANOL - Abnormal; Notable for the following:    Alcohol, Ethyl (B) 77 (*)    All other components within normal limits  ACETAMINOPHEN LEVEL - Abnormal; Notable for the following:    Acetaminophen (Tylenol), Serum <10 (*)    All other components within normal limits  SALICYLATE LEVEL  CBC  URINE RAPID DRUG SCREEN, HOSP PERFORMED  PREGNANCY, URINE    Imaging Review No results found.    MDM   Final diagnoses:  None    The patient is here with his alcohol intoxication, intended to overdose, she does not appear to be severely intoxicated, she does endorse being ongoingly suicidal, she will need to be involuntarily committed and placed in inpatient psychiatric institution, will ask for consultation from psychiatric services. The patient is in agreement with this plan as well. She states "I guess I need help"  IVC papers completed  Eber HongBrian Zamari Vea, MD 01/13/15 0900
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
## 2015-01-13 ENCOUNTER — Inpatient Hospital Stay (HOSPITAL_COMMUNITY)
Admission: AD | Admit: 2015-01-13 | Discharge: 2015-01-18 | DRG: 885 | Disposition: A | Discharge status: Home / Self Care | Payer: 59 | Source: Intra-hospital | Attending: Psychiatry | Admitting: Psychiatry

## 2015-01-13 ENCOUNTER — Encounter (HOSPITAL_COMMUNITY): Payer: Self-pay | Admitting: *Deleted

## 2015-01-13 DIAGNOSIS — G4700 Insomnia, unspecified: Secondary | ICD-10-CM | POA: Diagnosis present

## 2015-01-13 DIAGNOSIS — F4310 Post-traumatic stress disorder, unspecified: Secondary | ICD-10-CM | POA: Diagnosis present

## 2015-01-13 DIAGNOSIS — F332 Major depressive disorder, recurrent severe without psychotic features: Secondary | ICD-10-CM | POA: Diagnosis present

## 2015-01-13 DIAGNOSIS — F419 Anxiety disorder, unspecified: Secondary | ICD-10-CM | POA: Diagnosis present

## 2015-01-13 DIAGNOSIS — R45851 Suicidal ideations: Secondary | ICD-10-CM | POA: Diagnosis present

## 2015-01-13 DIAGNOSIS — Z87891 Personal history of nicotine dependence: Secondary | ICD-10-CM | POA: Diagnosis not present

## 2015-01-13 DIAGNOSIS — F319 Bipolar disorder, unspecified: Secondary | ICD-10-CM | POA: Diagnosis not present

## 2015-01-13 MED ORDER — MAGNESIUM HYDROXIDE 400 MG/5ML PO SUSP: 30.0000 mL | Freq: Every day | ORAL | Status: DC | PRN

## 2015-01-13 MED ORDER — HYDROXYZINE HCL 25 MG PO TABS: 25.0000 mg | ORAL_TABLET | Freq: Four times a day (QID) | ORAL | Status: DC | PRN

## 2015-01-13 MED ORDER — FLUOXETINE HCL 10 MG PO CAPS
10.0000 mg | ORAL_CAPSULE | Freq: Every day | ORAL | Status: DC
  Administered 2015-01-13: 10 mg via ORAL
  Filled 2015-01-13: qty 1

## 2015-01-13 MED ORDER — TRAZODONE HCL 50 MG PO TABS
50.0000 mg | ORAL_TABLET | Freq: Every day | ORAL | Status: DC
  Filled 2015-01-13 (×3): qty 1
  Filled 2015-01-13: qty 3
  Filled 2015-01-13 (×3): qty 1

## 2015-01-13 MED ORDER — CITALOPRAM HYDROBROMIDE 10 MG PO TABS
10.0000 mg | ORAL_TABLET | Freq: Every day | ORAL | Status: DC
  Filled 2015-01-13: qty 1

## 2015-01-13 MED ORDER — TRAZODONE HCL 50 MG PO TABS: 50.0000 mg | ORAL_TABLET | Freq: Every day | ORAL | Status: DC

## 2015-01-13 MED ORDER — HYDROXYZINE HCL 25 MG PO TABS
25.0000 mg | ORAL_TABLET | Freq: Four times a day (QID) | ORAL | Status: DC | PRN
  Administered 2015-01-17: 25 mg via ORAL
  Filled 2015-01-13: qty 1
  Filled 2015-01-13: qty 6

## 2015-01-13 MED ORDER — FLUOXETINE HCL 10 MG PO CAPS
10.0000 mg | ORAL_CAPSULE | Freq: Every day | ORAL | Status: DC
  Administered 2015-01-14 – 2015-01-17 (×4): 10 mg via ORAL
  Filled 2015-01-13 (×6): qty 1

## 2015-01-13 MED ORDER — ACETAMINOPHEN 325 MG PO TABS: 650.0000 mg | ORAL_TABLET | Freq: Four times a day (QID) | ORAL | Status: DC | PRN

## 2015-01-13 MED ORDER — ALUM & MAG HYDROXIDE-SIMETH 200-200-20 MG/5ML PO SUSP: 30.0000 mL | ORAL | Status: DC | PRN

## 2015-01-13 NOTE — ED Notes (Signed)
Pt's mom into see 

## 2015-01-13 NOTE — ED Notes (Signed)
[facility].  [facility]
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
## 2015-01-13 NOTE — Progress Notes (Addendum)
Patient ID: Karen DrownMarissa J Kim, female   DOB: 09-28-1995, 19 y.o.   MRN: [id number]  Pt currently presents with a flat affect and depressed behavior. Pt has minimal interaction with staff.  Pt provided with a 1:1. Pt concerned that her mother "wants me to go to a rehab in [place] and I am worried I will lose my bed there, I need to go there to get better." Pt supported emotionally and encouraged to express concerns and questions.   Pt's safety ensured with 15 minute and environmental checks. Pt currently denies SI/HI and A/V hallucinations. Pt verbally agrees to seek staff if SI/HI or A/VH occurs and to consult with staff before acting on these thoughts. Will continue POC.

## 2015-01-13 NOTE — ED Notes (Signed)
[employer] contacted for transport 

## 2015-01-13 NOTE — BH Assessment (Signed)
[facility] Assessment Progress Note  Per Thedore MinsMojeed Akintayo, MD, this pt requires psychiatric hospitalization at this time.  Thurman CoyerEric Kaplan, RN, [facility] R[facility].  Pt is under involuntary commitment initiated by EDP Eber HongBrian Miller, MD.  Pt has signed Consent to Release Information, and signed form has [facility] along with [facility]C documents.  Pt's nurse, Wille CelesteJanie, has been notified, and agrees to send original paperwork along with pt via [employer], and to call report to [phone].  Doylene Canninghomas Jaid Quirion, MA Triage Specialist [phone]

## 2015-01-13 NOTE — Progress Notes (Signed)
Pt attended Karaoke group tonight. 

## 2015-01-13 NOTE — ED Notes (Signed)
Pt is [facility]

## 2015-01-13 NOTE — ED Notes (Signed)
Pt's mom called and requests that the [facility] because she has been admitted there numerous times and does not feel that she is getting what she needs.  Mom is requesting that she [facility]. She has contacted her insurance and that will cover [facility]. Will give info to TTS.

## 2015-01-13 NOTE — Progress Notes (Signed)
Patient ID: Kathyrn DrownMarissa J Mcgregory, female   DOB: 07/21/1995, 19 y.o.   MRN: [id number] Admit note-[facility] to her threats of suicide and binge drinking. She states she has been depressed for years, and has been hospitalized four times due to it. She drinks when she gets really depressed, drinking beer 3 times a month, 3-4 beers at a time. She last drank yesterday. She lives with her mom, step dad and two brothers. She says her mom is her support system. Her biological father committed suicide when client was 19 yo he was known to be a drug and alcohol user. [facility]. She states her mom has arranged for her a long term hospitalization for [place] in [place], for up to six weeks, and she thinks she can go there this week. She signed a release of information for us to be able to talk to her mom re her care. She has a long scar on her back from the base of her neck to her coccyx from scoliosis. She states she is in good health. She states she is still suicidal but is able to contract for safety while here. She is pleasant and cooperative with the admission process. She is not psychotic or homicidal.

## 2015-01-14 ENCOUNTER — Encounter (HOSPITAL_COMMUNITY): Payer: Self-pay | Admitting: Psychiatry

## 2015-01-14 MED ORDER — ZIPRASIDONE HCL 20 MG PO CAPS
20.0000 mg | ORAL_CAPSULE | Freq: Two times a day (BID) | ORAL | Status: DC
  Administered 2015-01-14 – 2015-01-18 (×7): 20 mg via ORAL
  Filled 2015-01-14 (×9): qty 1
  Filled 2015-01-14: qty 6
  Filled 2015-01-14: qty 1
  Filled 2015-01-14: qty 6
  Filled 2015-01-14: qty 1

## 2015-01-14 MED ORDER — ENSURE ENLIVE PO LIQD
237.0000 mL | Freq: Two times a day (BID) | ORAL | Status: DC
  Administered 2015-01-15: 237 mL via ORAL

## 2015-01-14 NOTE — [facility] Group Notes (Signed)
[facility] LCSW Group Therapy 01/14/2015 1:15pm  Type of Therapy: Group Therapy- Feelings Around Relapse and Recovery  Participation Level: Active   Participation Quality:  Appropriate  Affect:  Appropriate  Cognitive: Alert and Oriented   Insight:  Developing   Engagement in Therapy: Developing/Improving and Engaged   Modes of Intervention: Clarification, Confrontation, Discussion, Education, Exploration, Limit-setting, Orientation, Problem-solving, Rapport Building, Dance movement psychotherapisteality Testing, Socialization and Support  Summary of Progress/Problems: The topic for today was feelings about relapse. The group discussed what relapse prevention is to them and identified triggers that they are on the path to relapse. Members also processed their feeling towards relapse and were able to relate to common experiences. Group also discussed coping skills that can be used for relapse prevention.  Pt participated actively in group discussion and identified negativity as a trigger to relapse for her into depression. She also discussed self-medication with alcohol when she feels depressed. Pt demonstrates developing insight AEB ability to accurately identify triggers, warning signs, and plan of prevention related to relapse.   Therapeutic Modalities:   Cognitive Behavioral Therapy Solution-Focused Therapy Assertiveness Training Relapse Prevention Therapy    Karen SprinklesLauren Kim, LCSWA [phone] 01/14/2015 4:47 PM
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
## 2015-01-14 NOTE — Progress Notes (Signed)
NUTRITION ASSESSMENT  Pt identified as at risk on the Malnutrition Screen Tool  INTERVENTION: 1. Educated patient on the importance of nutrition and encouraged intake of food and beverages. 2. Discussed weight goals. 3. Supplements: Ensure Enlive po BID, each supplement provides 350 kcal and 20 grams of protein   NUTRITION DIAGNOSIS: Unintentional weight loss related to sub-optimal intake as evidenced by pt report.   Goal: Pt to meet >/= 90% of their estimated nutrition needs.  Monitor:  PO intake  Assessment:  Pt admitted with depression and binge-drinking. Per RN note, pt has been drinking when feeling depressed.  Per weight history documentation, pt has lost 21 lb since 05/26/14 (11% weight loss x 7.5 months, significant for time frame).  Pt would benefit from nutritional supplementation, RD to order.   Height: Ht Readings from Last 1 Encounters:  01/13/15 5\' 1"  (1.549 m) (10 %*, Z = -1.29)   * Growth percentiles are based on CDC 2-20 Years data.    Weight: Wt Readings from Last 1 Encounters:  01/13/15 171 lb 8 oz (77.792 kg) (92 %*, Z = 1.41)   * Growth percentiles are based on CDC 2-20 Years data.    Weight Hx: Wt Readings from Last 10 Encounters:  01/13/15 171 lb 8 oz (77.792 kg) (92 %*, Z = 1.41)  12/30/14 172 lb (78.019 kg) (92 %*, Z = 1.42)  09/30/14 182 lb (82.555 kg) (95 %*, Z = 1.64)  05/26/14 192 lb (87.091 kg) (97 %*, Z = 1.83)  05/24/14 194 lb 10.7 oz (88.3 kg) (97 %*, Z = 1.87)  04/08/14 180 lb (81.647 kg) (95 %*, Z = 1.62)  01/18/14 188 lb 11.4 oz (85.6 kg) (96 %*, Z = 1.79)  10/26/13 180 lb (81.647 kg) (95 %*, Z = 1.65)  03/12/12 170 lb 14.4 oz (77.52 kg) (94 %*, Z = 1.56)  08/20/11 180 lb 8.9 oz (81.9 kg) (96 %*, Z = 1.76)   * Growth percentiles are based on CDC 2-20 Years data.    BMI:  Body mass index is 32.42 kg/(m^2). Pt meets criteria for obesity based on current BMI.  Estimated Nutritional Needs: Kcal: 25-30 kcal/kg Protein: > 1  gram protein/kg Fluid: 1 ml/kcal  Diet Order:   Pt is also offered choice of unit snacks mid-morning and mid-afternoon.  Pt is eating as desired.   Lab results and medications reviewed.   Karen FrancoLindsey Damian Buckles, MS, RD, LDN Pager: [phone] After Hours Pager: [phone]
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
## 2015-01-14 NOTE — [facility] Suicide Risk Assessment (Signed)
[facility] INPATIENT:  Family/Significant Other Suicide Prevention Education  Suicide Prevention Education:  Education Completed; Karen Kim, Pt's mother [phone]) has been identified by the patient as the family member/significant other with whom the patient will be residing, and identified as the person(s) who will aid the patient in the event of a mental health crisis (suicidal ideations/suicide attempt).  CSW offered to provide SPE, however, Pt's mother stated that she feels that she has a good understanding of risk factors, warning signs, and intervention related to suicide as Pt has been admitted several times here. Pt's mother reports that she is supportive of Pt and would like her to be safe. SPE reviewed with patient and brochure provided. Patient encouraged to return to hospital if having suicidal thoughts, patient verbalized his/her understanding and has no further questions at this time.   Elaina HoopsCarter, Hasel Janish M 01/14/2015, 4:51 PM

## 2015-01-14 NOTE — Progress Notes (Signed)
D:  Per pt self inventory pt reports sleeping fair, appetite fair, energy level normal, ability to pay attention good, rates depression at a 5 out of 10, hopelessness at a 5 out of 10, anxiety at an 8 out of 10, denies SI/HI/AVH, flat/depressed, pt did not set goal for herself today.      A:  Emotional support provided, Encouraged pt to continue with treatment plan and attend all group activities, q15 min checks maintained for safety.  R:  Pt slept most of the morning, woke up for lunch and has been present in dayroom this afternoon, going to some groups, med compliant, pleasant and cooperative with staff and other patients on the unit.

## 2015-01-14 NOTE — Progress Notes (Signed)
Recreation Therapy Notes  Date: 07.15.16 Time: 9:30 am Location: 300 Hall Dayroom  Group Topic: Stress Management  Goal Area(s) Addresses:  Patient will verbalize importance of using healthy stress management.  Patient will identify positive emotions associated with healthy stress management.   Intervention: Stress Management  Activity : Progressive Muscle Relaxation. LRT introduced and explained the stress management technique of progressive muscle relaxation. LRT used a script to deliver the technique. Patients were asked to follow the script read a loud by the LRT to participate in the stress management technique.  Education: Stress Management, Discharge Planning.   Education Outcome: Acknowledges edcuation/In group clarification offered  Clinical Observations/Feedback: Patient did not attend group.   Caroll RancherMarjette Thedford Bunton, LRT/CTRS  Caroll RancherLindsay, Helon Wisinski A 01/14/2015 11:49 AM

## 2015-01-14 NOTE — [facility] Group Notes (Signed)
[facility]  01/14/2015 8:45 AM  Pt did not attend, declined invitation.   Chad CordialLauren Carter, LCSWA 01/14/2015 10:17 AM

## 2015-01-14 NOTE — Progress Notes (Signed)
[facility] Group Notes:  (Nursing/MHT/Case Management/Adjunct)  Date:  01/14/2015  Time:  9:27 PM  Type of Therapy:  Psychoeducational Skills  Participation Level:  Minimal  Participation Quality:  Appropriate  Affect:  Appropriate  Cognitive:  Appropriate  Insight:  Lacking and Limited  Engagement in Group:  Lacking and Limited  Modes of Intervention:  Discussion  Summary of Progress/Problems: Tonight in wrap up group Karen Kim did not speak much on how her day was going for her nor did she say what was positive about her day. She stated that it was an "average day" and that there was nothing positive or negative that went on. Karen Kim 01/14/2015, 9:27 PM

## 2015-01-14 NOTE — Tx Team (Signed)
Interdisciplinary Treatment Plan Update (Adult) Date: 01/14/2015   Date: 01/14/2015 9:39 AM  Progress in Treatment:  Attending groups: Pt is new to milieu, continuing to assess  Participating in groups: Pt is new to milieu, continuing to assess  Taking medication as prescribed: Yes  Tolerating medication: Yes  Family/Significant othe contact made: No, CSW assessing for appropriate contact Patient understands diagnosis: Yes Discussing patient identified problems/goals with staff: Yes  Medical problems stabilized or resolved: Yes  Denies suicidal/homicidal ideation: Yes Patient has not harmed self or Others: Yes   New problem(s) identified: None identified at this time.   Discharge Plan or Barriers: CSW will assess for appropriate discharge plan and relevant barriers.   Additional comments: n/a   Reason for Continuation of Hospitalization:  Anxiety Depression Medication stabilization Suicidal ideation  Estimated length of stay: 3-5 days  For review of initial/current patient goals, please see plan of care.   Attendees:  Patient:    Family:    Physician: Dr. Jama Flavorsobos, MD  01/14/2015 8:28 AM  Nursing: Onnie BoerJennifer Clark, RN Case manager  01/14/2015 8:28 AM  Clinical Social Worker Lamar SprinklesLauren Carter, LCSWA, MSW 01/14/2015 8:28 AM  Other: Leisa LenzValerie Enoch, Vesta MixerMonarch Liasion 01/14/2015 8:28 AM  Clinical: Keitha ButteAndrea Thorn, RN; Fenton MallingMary Trainer, RN  01/14/2015 8:28 AM  Other: , RN Charge Nurse 01/14/2015 8:28 AM  Other:      Chad CordialLauren Carter, Theresia MajorsLCSWA MSW
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
## 2015-01-15 DIAGNOSIS — F332 Major depressive disorder, recurrent severe without psychotic features: Principal | ICD-10-CM

## 2015-01-15 DIAGNOSIS — G4700 Insomnia, unspecified: Secondary | ICD-10-CM

## 2015-01-15 NOTE — Progress Notes (Signed)
Writer has observed patient up in the dayroom and on the phone briefly this evening, with minimal to no interaction with peers. She denies si/hi/a/v hallucinations. She appears guarded and voiced no complaints during our 1:1. Support and encouragement given, safety maintained on unit with 15 min checks.

## 2015-01-15 NOTE — Progress Notes (Signed)
Psychoeducational Group Note  Date:  01/15/2015 Time:  1030  Group Topic/Focus:  Identifying Needs:   The focus of this group is to help patients identify their personal needs that have been historically problematic and identify healthy behaviors to address their needs.  Participation Level: Did Not Attend  Participation Quality:  Not Applicable  Affect:  Not Applicable  Cognitive:  Not Applicable  Insight:  Not Applicable  Engagement in Group: Not Applicable  Additional Comments:    Rich BraveDuke, Tedi Hughson Lynn 01/15/2015, 2:35 PM

## 2015-01-15 NOTE — Progress Notes (Signed)
Adult Psychoeducational Group Note  Date:  01/15/2015 Time:  9:11 PM  Group Topic/Focus:  Wrap-Up Group:   The focus of this group is to help patients review their daily goal of treatment and discuss progress on daily workbooks.  Participation Level:  Active  Participation Quality:  Appropriate and Attentive  Affect:  Appropriate  Cognitive:  Appropriate  Insight: Appropriate  Engagement in Group:  Engaged  Modes of Intervention:  Discussion  Additional Comments:  Pt stated her goal for tomorrow is to continue working on her discharge plan with the doctor and case worker. Pt stated she has plans [place] [facility] [place].  [name], [name] C 01/15/2015, 9:11 PM

## 2015-01-15 NOTE — [facility] Group Notes (Signed)
[facility] LCSW Group Therapy  01/15/2015  1:15 PM  Type of Therapy:  Group Therapy  Participation Level:  Active  Participation Quality:  Appropriate, Attentive and Sharing when asked direct questions  Affect:  Flat   Cognitive:  Alert and Oriented  Insight:  Developing/Improving  Engagement in Therapy:  Developing/Improving  Modes of Intervention:  Clarification, Discussion, Problem-solving, Socialization and Support  Summary of Progress/Problems: Summary of Progress/Problems: The main focus of today's process group was to learn how to use a decisional balance exercise to move forward in the Stages of Change, which were described and discussed. Motivational Interviewing and a worksheet were utilized to help patients explore in depth the perceived benefits and costs of unhealthy coping techniques, as well as the benefits and costs of replacing that with a healthy coping skills. Patient shared during group warm up that she "wants to get better" and when asked if she believes it possible she nodded in the affirmative. Patient was attentive to each speaker in group and apppeared to track the discussion. When asked what she is willing to do to 'get better' patient reported "surround myself with more positive people." When asked what it would feel like to say "I deserve better supports and peers" patient reported it would feel empowering and she is willing to take on challenge of increasing her supports.    Clide DalesHarrill, Catherine Campbell

## 2015-01-15 NOTE — Progress Notes (Signed)
[facility] MD Progress Note  01/15/2015 7:46 PM Karen Kim  MRN:  [id number]  Subjective: Karen Kim reports, "I'm not getting any better by being here. I need to be discharged so that I can go to [facility] for my depression. Someone told my mother that they have a long program for treatment depression. That where I need to be"  Objective: Karen Kim is seen, chart reviewed. She is visible on the unit, attending group milieu, but not participating. She presents with flat affected, appears unmotivated. She says she learnt about a long term program at the [facility] just meant for treatment of depression. She wants to go there. She says she is not getting any better here. Karen Kim also says, she started drinking alcohol as soon as she left this hospital a little over ago. She says she also became suicidal & informed her brother that she is going to kill herself. That was the reason she came back to this hospital. Karen Kim has been tried on different kinds of medications for her depression. However, she has not been on any of the medications long enough to assure its effectiveness.   Principal Problem: MDD (major depressive disorder), recurrent severe, without psychosis  Diagnosis:   Patient Active Problem List   Diagnosis Date Noted  . Major depressive disorder, recurrent severe without psychotic features [F33.2] 01/13/2015  . 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 Suicidal thoughts [R45.851]   . MDD (major depressive disorder), recurrent, severe, with psychosis [F33.3] 12/31/2014  . History of bipolar disorder [Z86.59] 12/31/2014  . PTSD (post-traumatic stress disorder) [F43.10] 12/31/2014  . Altered mental status [R41.82] 05/23/2014  . Overdose [T50.901A] 05/23/2014  . Metabolic encephalopathy [G93.41] 05/23/2014  . Hypotension, unspecified [I95.9] 01/17/2014  . Bradycardia [R00.1] 01/17/2014  . T wave inversion in EKG [R94.31]  01/17/2014  . Drug overdose [T50.901A] 01/16/2014  . Gonorrhea [A54.9]    Total Time spent with patient: 35 minutes  Past Medical History:  Past Medical History  Diagnosis Date  . Scoliosis   . MRSA (methicillin resistant staph aureus) culture positive   . Bipolar affective   . Depression   . Gonorrhea     Past Surgical History  Procedure Laterality Date  . Back surgery  2012    spinal fusion  . Back surgery  2012    MRSA infection- rods removed.  . Back surgery  2013    Rods put back in   Family History:  Family History  Problem Relation Age of Onset  . Suicidality Father     Committed suicide   Social History:  History  Alcohol Use  . Yes    Comment: occasionally     History  Drug Use  . Yes  . Special: Marijuana    Comment: couple times a month    History   Social History  . Marital Status: Single    Spouse Name: N/A  . Number of Children: N/A  . Years of Education: 12   Occupational History  . Unemployed    Social History Main Topics  . Smoking status: Former Games developer  . Smokeless tobacco: Never Used  . Alcohol Use: Yes     Comment: occasionally  . Drug Use: Yes    Special: Marijuana     Comment: couple times a month  . Sexual Activity: Yes    Birth Control/ Protection: None   Other Topics Concern  . None   Social History Narrative  Lives with mother. Single. Graduated high school. Currently unemployed.   Additional History:    Sleep: Fair  Appetite:  Fair   Musculoskeletal: Strength & Muscle Tone: within normal limits Gait & Station: normal Patient leans: N/A  Psychiatric Specialty Exam: Physical Exam  ROS  Blood pressure 101/57, pulse 83, temperature 98.6 F (37 C), temperature source Oral, resp. rate 18, height 5\' 1"  (1.549 m), weight 77.792 kg (171 lb 8 oz), last menstrual period 12/01/2014.Body mass index is 32.42 kg/(m^2).  General Appearance: Casual  Eye Contact::  Fair  Speech:  Clear and Coherent  Volume:  Decreased   Mood:  Depressed and Hopeless  Affect:  Congruent, Depressed and Flat  Thought Process:  Coherent  Orientation:  Full (Time, Place, and Person)  Thought Content:  Rumination  Suicidal Thoughts:  denies intent or plans  Homicidal Thoughts:  No  Memory:  Intact  Judgement:  Fair  Insight:  Fair  Psychomotor Activity:  Decreased  Concentration:  Fair  Recall:  Good  Fund of Knowledge:Fair  Language: Good  Akathisia:  No  Handed:  Right  AIMS (if indicated):     Assets:  Desire for Improvement  ADL's:  Intact  Cognition: WNL  Sleep:      Current Medications: Current Facility-Administered Medications  Medication Dose Route Frequency Provider Last Rate Last Dose  . acetaminophen (TYLENOL) tablet 650 mg  650 mg Oral Q6H PRN Earney NavyJosephine C Onuoha, NP      . alum & mag hydroxide-simeth (MAALOX/MYLANTA) 200-200-20 MG/5ML suspension 30 mL  30 mL Oral Q4H PRN Earney NavyJosephine C Onuoha, NP      . feeding supplement (ENSURE ENLIVE) (ENSURE ENLIVE) liquid 237 mL  237 mL Oral BID BM Tilda FrancoLindsey Baker, RD   237 mL at 01/15/15 1400  . FLUoxetine (PROZAC) capsule 10 mg  10 mg Oral Daily Earney NavyJosephine C Onuoha, NP   10 mg at 01/15/15 1316  . hydrOXYzine (ATARAX/VISTARIL) tablet 25 mg  25 mg Oral Q6H PRN Earney NavyJosephine C Onuoha, NP      . magnesium hydroxide (MILK OF MAGNESIA) suspension 30 mL  30 mL Oral Daily PRN Earney NavyJosephine C Onuoha, NP      . traZODone (DESYREL) tablet 50 mg  50 mg Oral QHS Earney NavyJosephine C Onuoha, NP   50 mg at 01/13/15 2200  . ziprasidone (GEODON) capsule 20 mg  20 mg Oral BID WC Craige CottaFernando A Cobos, MD   20 mg at 01/15/15 1426    Lab Results: No results found for this or any previous visit (from the past 48 hour(s)).  Physical Findings: AIMS: Facial and Oral Movements Muscles of Facial Expression: None, normal Lips and Perioral Area: None, normal Jaw: None, normal Tongue: None, normal,Extremity Movements Upper (arms, wrists, hands, fingers): None, normal Lower (legs, knees, ankles, toes): None,  normal, Trunk Movements Neck, shoulders, hips: None, normal, Overall Severity Severity of abnormal movements (highest score from questions above): None, normal Incapacitation due to abnormal movements: None, normal Patient's awareness of abnormal movements (rate only patient's report): No Awareness, Dental Status Current problems with teeth and/or dentures?: No Does patient usually wear dentures?: No  CIWA:    COWS:     Treatment Plan Summary: Daily contact with patient to assess and evaluate symptoms and progress in treatment and Medication management:   Plan: Supportive approach/coping skills/relapse prevention           Depression: will continue the Prozac 10 mg and continue to work with mindfulness, CBT help  identify the cognitive distortions  that keep the depression going.          Mood control: continue Geodon 20 mg q bedtime.          Insomnia: Continue Trazodone 50 mg, and Hydroxyzine 25 mg for anxiety.          Discuss other life style changes that can help with her depression such like exercise, meditation.           Will use motivational interviewing and encourage to pursue counseling services after discharge            Medical Decision Making:  Established Problem, Stable/Improving (1), Review of Psycho-Social Stressors (1), Review of Last Therapy Session (1), Review of Medication Regimen & Side Effects (2) and Review of New Medication or Change in Dosage (2)  [name], PMHNP 01/15/2015, 7:46 PM I personally assessed the patient and formulated the plan Madie Reno A. Dub Mikes, M.D.

## 2015-01-15 NOTE — [facility] Group Notes (Signed)
[facility] Group Notes:  (Nursing/MHT/Case Management/Adjunct)  Date:  01/15/2015  Time:  1:49 PM  Type of Therapy:  Goals Group: The focus of the group is to educate patients how to set healthy goals as well as identify skills needed to accomplish these goals.  Participation Level:  Patient did not attend.  Participation Quality:  N/A  Affect: N/A   Cognitive: N/A   Insight: N/A   Engagement in Group:  N/A  Modes of Intervention:  N/A  Summary of Progress/Problems:  Rich BraveDuke, Nakai Yard Lynn 01/15/2015, 1:49 PM

## 2015-01-15 NOTE — Plan of Care (Signed)
Problem: Alteration in mood & ability to function due to Goal: STG-Patient will attend groups Outcome: Progressing Patient attended group this evening and participated.     

## 2015-01-15 NOTE — Progress Notes (Signed)
D Karen Kim is sad, quiet and reserved today. She did not get OOB for breakfast because, she says she didn't wake up " and nobody woke me up". She is seen standing outside the 400 hall med room..shortly after lunchtime today. HE eyes are heavy and she looks very tired. When this writer asked her, she admitted that she didn't sleep " much" last night.  A She came to the med window after lunch...said " I missed my lunch " and was given her morning dose of prozac and then was instructed to eat her lunch ( which she did )  And then was given her morning   Dose  Of geodon, tolerating well .  R Safety in place and poc contd.

## 2015-01-15 NOTE — Progress Notes (Signed)
Writer has observed patient up in the dayroom watching tv with minimal interaction with peers. She reports that her day has been good and is waiting to go to [facility]. She denies si/hi/a/v hallucinations. Patient reports that she does not want to take the trazadone ordered for sleep because she does not have trouble falling asleep. She reports that she has attended 2 groups today. Support and encouragement given, safety maintained on unit with 15 min checks.

## 2015-01-16 NOTE — Progress Notes (Signed)
Karen Kim spent the entire day sleeping in her bed....just like yesterday. She got up around 1530 ( just like yesterday ) , bathed , put her clothes on and then demanded to be given her medications.     A She has not attended any groups  Today, she does complete her daily assessment  And on it she wrote  She denied SI  And she rated her depression, hopelessness and anxiety " 01/05/09", respectively.    R She says ( again ) " I still didn't sleep last night" and when this nurse speaks with practitioner about this, she said pt had refused minipress. Will cont to process with pt and try to develop plan to induce nighttime sleep.

## 2015-01-16 NOTE — Progress Notes (Signed)
Patient ID: Karen Kim, female   DOB: 1995/11/17, 19 y.o.   MRN: [id number] [facility] MD Progress Note  01/16/2015 5:16 PM Karen Kim  MRN:  [id number]  Subjective: Karen Kim reports, "I'm doing okay. I did not go to group today because it is not helping me. Also, no one reminded me that it is time for the group anyway"  Objective: Karen Kim is seen, chart reviewed. She is in her room in bed, not attending group milieu & not participating. She also blamed the staff for not reminding her the time for the group session. She presents with flat affected, appears unmotivated. She says she learnt about a long term program at the [facility] just meant for treatment of depression. She wants to go there. She says she is not getting any better here. Karen Kim also says, she started drinking alcohol as soon as she left this hospital a little over ago. She says she also became suicidal & informed her brother that she is going to kill herself. That was the reason she came back to this hospital. Karen Kim has been tried on different kinds of medications for her depression. However, she has not been on any of the medications long enough to assure its effectiveness.   Principal Problem: MDD (major depressive disorder), recurrent severe, without psychosis  Diagnosis:   Patient Active Problem List   Diagnosis Date Noted  . Major depressive disorder, recurrent severe without psychotic features [F33.2] 01/13/2015  . 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 Suicidal thoughts [R45.851]   . MDD (major depressive disorder), recurrent, severe, with psychosis [F33.3] 12/31/2014  . History of bipolar disorder [Z86.59] 12/31/2014  . PTSD (post-traumatic stress disorder) [F43.10] 12/31/2014  . Altered mental status [R41.82] 05/23/2014  . Overdose [T50.901A] 05/23/2014  . Metabolic encephalopathy [G93.41] 05/23/2014  . Hypotension, unspecified [I95.9] 01/17/2014  .  Bradycardia [R00.1] 01/17/2014  . T wave inversion in EKG [R94.31] 01/17/2014  . Drug overdose [T50.901A] 01/16/2014  . Gonorrhea [A54.9]    Total Time spent with patient: 35 minutes  Past Medical History:  Past Medical History  Diagnosis Date  . Scoliosis   . MRSA (methicillin resistant staph aureus) culture positive   . Bipolar affective   . Depression   . Gonorrhea     Past Surgical History  Procedure Laterality Date  . Back surgery  2012    spinal fusion  . Back surgery  2012    MRSA infection- rods removed.  . Back surgery  2013    Rods put back in   Family History:  Family History  Problem Relation Age of Onset  . Suicidality Father     Committed suicide   Social History:  History  Alcohol Use  . Yes    Comment: occasionally     History  Drug Use  . Yes  . Special: Marijuana    Comment: couple times a month    History   Social History  . Marital Status: Single    Spouse Name: N/A  . Number of Children: N/A  . Years of Education: 12   Occupational History  . Unemployed    Social History Main Topics  . Smoking status: Former Games developer  . Smokeless tobacco: Never Used  . Alcohol Use: Yes     Comment: occasionally  . Drug Use: Yes    Special: Marijuana     Comment: couple times a month  . Sexual Activity: Yes    Birth  Control/ Protection: None   Other Topics Concern  . None   Social History Narrative   Lives with mother. Single. Graduated high school. Currently unemployed.   Additional History:    Sleep: Fair  Appetite:  Fair  Musculoskeletal: Strength & Muscle Tone: within normal limits Gait & Station: normal Patient leans: N/A  Psychiatric Specialty Exam: Physical Exam  ROS  Blood pressure 97/58, pulse 91, temperature 98.6 F (37 C), temperature source Oral, resp. rate 18, height  (1.549 m), weight 77.792 kg (171 lb 8 oz), last menstrual period 12/01/2014.Body mass index is 32.42 kg/(m^2).  General Appearance: Casual  Eye  Contact::  Fair  Speech:  Clear and Coherent  Volume:  Decreased  Mood:  Depressed and Hopeless  Affect:  Congruent, Depressed and Flat  Thought Process:  Coherent  Orientation:  Full (Time, Place, and Person)  Thought Content:  Rumination  Suicidal Thoughts:  denies intent or plans  Homicidal Thoughts:  No  Memory:  Intact  Judgement:  Fair  Insight:  Fair  Psychomotor Activity:  Decreased  Concentration:  Fair  Recall:  Good  Fund of Knowledge:Fair  Language: Good  Akathisia:  No  Handed:  Right  AIMS (if indicated):     Assets:  Desire for Improvement  ADL's:  Intact  Cognition: WNL  Sleep:  Number of Hours: 6.75   Current Medications: Current Facility-Administered Medications  Medication Dose Route Frequency Provider Last Rate Last Dose  . acetaminophen (TYLENOL) tablet 650 mg  650 mg Oral Q6H PRN Earney Navy, NP      . alum & mag hydroxide-simeth (MAALOX/MYLANTA) 200-200-20 MG/5ML suspension 30 mL  30 mL Oral Q4H PRN Earney Navy, NP      . feeding supplement (ENSURE ENLIVE) (ENSURE ENLIVE) liquid 237 mL  237 mL Oral BID BM Tilda Franco, RD   237 mL at 01/15/15 1400  . FLUoxetine (PROZAC) capsule 10 mg  10 mg Oral Daily Earney Navy, NP   10 mg at 01/16/15 1536  . hydrOXYzine (ATARAX/VISTARIL) tablet 25 mg  25 mg Oral Q6H PRN Earney Navy, NP      . magnesium hydroxide (MILK OF MAGNESIA) suspension 30 mL  30 mL Oral Daily PRN Earney Navy, NP      . traZODone (DESYREL) tablet 50 mg  50 mg Oral QHS Earney Navy, NP   50 mg at 01/13/15 2200  . ziprasidone (GEODON) capsule 20 mg  20 mg Oral BID WC Craige Cotta, MD   20 mg at 01/16/15 1535    Lab Results: No results found for this or any previous visit (from the past 48 hour(s)).  Physical Findings: AIMS: Facial and Oral Movements Muscles of Facial Expression: None, normal Lips and Perioral Area: None, normal Jaw: None, normal Tongue: None, normal,Extremity Movements Upper  (arms, wrists, hands, fingers): None, normal Lower (legs, knees, ankles, toes): None, normal, Trunk Movements Neck, shoulders, hips: None, normal, Overall Severity Severity of abnormal movements (highest score from questions above): None, normal Incapacitation due to abnormal movements: None, normal Patient's awareness of abnormal movements (rate only patient's report): No Awareness, Dental Status Current problems with teeth and/or dentures?: No Does patient usually wear dentures?: No  CIWA:    COWS:     Treatment Plan Summary: Daily contact with patient to assess and evaluate symptoms and progress in treatment and Medication management:   Plan: Supportive approach/coping skills/relapse prevention           Depression:  will continue the Prozac 10 mg and continue to work with mindfulness, CBT help  identify the cognitive distortions that keep the depression going.           Mood control: continue Geodon 20 mg q bedtime.           Insomnia: Continue Trazodone 50 mg, and Hydroxyzine 25 mg for anxiety.           Discuss other life style changes that can help with her depression such like exercise, meditation.           Will use motivational interviewing and encourage to pursue counseling services after discharge.            Medical Decision Making:  Established Problem, Stable/Improving (1), Review of Psycho-Social Stressors (1), Review of Last Therapy Session (1), Review of Medication Regimen & Side Effects (2) and Review of New Medication or Change in Dosage (2)  Sanjuana Kavawoko, Agnes I, PMHNP 01/16/2015, 5:16 PM I agree with assessment and plan Madie RenoIrving A. Dub MikesLugo, M.D.

## 2015-01-16 NOTE — Progress Notes (Signed)
Psychoeducational Group Note  Date:  02/09/2012 Time: 1015  Group Topic/Focus:  IRelaxation Group: The group is centered on helping patients relax and deescalate to the point where they can experience  How beneficial this coping skill can be.  Participation LevelPatient did not attend. Participation Quality: Affect:  Cognitive:    Insight:  Engagement in Group:  Additional Comments:

## 2015-01-16 NOTE — Progress Notes (Signed)
Adult Psychoeducational Group Note  Date:  01/16/2015 Time:  9:54 PM  Group Topic/Focus:  Wrap-Up Group:   The focus of this group is to help patients review their daily goal of treatment and discuss progress on daily workbooks.  Participation Level:  Did Not Attend  Additional Comments:  Pt was invited to attend group, however stayed in her room reading a magazine.  [name], Vanellope Passmore C 01/16/2015, 9:54 PM

## 2015-01-16 NOTE — [facility] Group Notes (Addendum)
[facility] LCSW Group Therapy  01/16/2015   1:15 PM  Type of Therapy:  Group Therapy  Participation Level:  Did Not Attend; reported to CSW she did not feel well    Harrill, Julious Payeratherine Campbell

## 2015-01-17 LAB — PROLACTIN: Prolactin: 63.9 ng/mL — ABNORMAL HIGH (ref 4.8–23.3)

## 2015-01-17 MED ORDER — FLUOXETINE HCL 20 MG PO CAPS
20.0000 mg | ORAL_CAPSULE | Freq: Every day | ORAL | Status: DC
  Administered 2015-01-18: 20 mg via ORAL
  Filled 2015-01-17: qty 3
  Filled 2015-01-17 (×3): qty 1

## 2015-01-17 MED ORDER — IBUPROFEN 600 MG PO TABS
600.0000 mg | ORAL_TABLET | Freq: Four times a day (QID) | ORAL | Status: DC | PRN
  Administered 2015-01-17: 600 mg via ORAL
  Filled 2015-01-17: qty 1

## 2015-01-17 MED ORDER — BENZOCAINE 10 % MT GEL
Freq: Three times a day (TID) | OROMUCOSAL | Status: DC | PRN
  Administered 2015-01-17 – 2015-01-18 (×2): via OROMUCOSAL
  Filled 2015-01-17: qty 9.4

## 2015-01-17 MED ORDER — LORAZEPAM 0.5 MG PO TABS
0.5000 mg | ORAL_TABLET | Freq: Four times a day (QID) | ORAL | Status: DC | PRN
  Administered 2015-01-17 – 2015-01-18 (×2): 0.5 mg via ORAL
  Filled 2015-01-17 (×2): qty 1

## 2015-01-17 NOTE — Progress Notes (Signed)
Recreation Therapy Notes  Date: 07.18.16 Time: 9:30 am Location: 300 Hall Group Room  Group Topic: Stress Management  Goal Area(s) Addresses:  Patient will verbalize importance of using healthy stress management.  Patient will identify positive emotions associated with healthy stress management.   Behavioral Response:  Engaged  Intervention: Stress Management  Activity :  Guided Imagery Script.  LRT introduced and educated patients on the stress management technique of guided imagery.  A script was used to deliver the technique to patients.  Patients were asked to follow the script read a loud by LRT to engage in practicing the stress management technique.  Education:  Stress Management, Discharge Planning.   Education Outcome: Acknowledges edcuation/In group clarification offered/Needs additional education  Clinical Observations/Feedback: Patient attended group.   Kauri Garson, LRT/CTRS         Reynolds Kittel A 01/17/2015 2:15 PM 

## 2015-01-17 NOTE — Progress Notes (Signed)
[facility] Group Notes:  (Nursing/MHT/Case Management/Adjunct)  Date:  01/17/2015  Time:  9:15 PM  Type of Therapy:  Psychoeducational Skills  Participation Level:  Active  Participation Quality:  Appropriate and Attentive  Affect:  Appropriate and Excited  Cognitive:  Appropriate  Insight:  Appropriate and Good  Engagement in Group:  Engaged  Modes of Intervention:  Activity  Summary of Progress/Problems: Pts played a therapeutic activity of [employer]. Pt was attentive, but did not participate.  [name], Chavon Lucarelli C 01/17/2015, 9:15 PM

## 2015-01-17 NOTE — [facility] Group Notes (Signed)
[facility] LCSW Group Therapy  01/17/2015 1:15pm  Type of Therapy:  Group Therapy vercoming Obstacles  Participation Level:  Minimal  Participation Quality:  Reserved  Affect:  Appropriate  Cognitive:  Appropriate and Oriented  Insight:  Developing/Improving and Improving  Engagement in Therapy:  Improving  Modes of Intervention:  Discussion, Exploration, Problem-solving and Support  Description of Group:   In this group patients will be encouraged to explore what they see as obstacles to their own wellness and recovery. They will be guided to discuss their thoughts, feelings, and behaviors related to these obstacles. The group will process together ways to cope with barriers, with attention given to specific choices patients can make. Each patient will be challenged to identify changes they are motivated to make in order to overcome their obstacles. This group will be process-oriented, with patients participating in exploration of their own experiences as well as giving and receiving support and challenge from other group members.  Summary of Patient Progress: Pt continues to participate minimally in group discussion. Pt does participate when prompted, identifying frustration related to obstacles in her life. Pt did not identify a specific obstacles or changes she needs to make in order to overcome that obstacle.  Therapeutic Modalities:   Cognitive Behavioral Therapy Solution Focused Therapy Motivational Interviewing Relapse Prevention Therapy   Chad CordialLauren Carter, LCSWA 01/17/2015 5:57 PM

## 2015-01-17 NOTE — Progress Notes (Signed)
D:  Per pt self inventory pt reports sleeping fair, appetite fair, energy level normal, ability to pay attention good, rates depression at a 5 out of 10, hopelessness at a 5 out of 10, anxiety at a 5 out of 10, denies SI/HI/AVH, pt did not set goal for herself today, pt would like to speak with Leotis ShamesLauren SW about [facility]--per pt her mother is arranging for [facility] to [facility], Lauren SW aware and will follow up  A:  Emotional support provided, Encouraged pt to continue with treatment plan and attend all group activities, q15 min checks maintained for safety.  R:  Pt is receptive, going to groups, pleasant and cooperative with staff and other patients on the unit.

## 2015-01-17 NOTE — Progress Notes (Addendum)
Patient ID: Karen Kim, female   DOB: 1995/11/16, 19 y.o.   MRN: [id number] [facility] MD Progress Note  01/17/2015 6:33 PM Karen Kim  MRN:  [id number]  Subjective:  Patient states she is feeling upset and dejected after finding out that long term admission to [facility] was not an option. Denies medication side effects.  Objective:  I have discussed case with treatment team and have met with patient. Patient had been focused on going to [facility] for extended hospitalization of several weeks. She states she had been told this service was available . She recently reviewed with CSW that this is not available and that [facility] offers acute, short term hospitalizations as needed . She states that since she found this out she has been upset, because " I do not want to go back home.", and describes family tension. Although tearful, sad, and ruminative, she states she wants to be discharged " right away, because you guys are not helping me at all". She responds partially to support, encouragement, validation of her frustration. CSW has also spoken with her to explore other options and she has expressed some interest in [employer]. Patient denies any SI, denies any self injurious ideations. She is tolerating medications well but at this time she does not think it is working for her.  Principal Problem: MDD (major depressive disorder), recurrent severe, without psychosis  Diagnosis:   Patient Active Problem List   Diagnosis Date Noted  . Major depressive disorder, recurrent severe without psychotic features [F33.2] 01/13/2015  . 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 Suicidal thoughts [R45.851]   . MDD (major depressive disorder), recurrent, severe, with psychosis [F33.3] 12/31/2014  . History of bipolar disorder [Z86.59] 12/31/2014  . PTSD (post-traumatic stress disorder) [F43.10] 12/31/2014  . Altered mental status [R41.82]  05/23/2014  . Overdose [T50.901A] 05/23/2014  . Metabolic encephalopathy [G93.41] 05/23/2014  . Hypotension, unspecified [I95.9] 01/17/2014  . Bradycardia [R00.1] 01/17/2014  . T wave inversion in EKG [R94.31] 01/17/2014  . Drug overdose [T50.901A] 01/16/2014  . Gonorrhea [A54.9]    Total Time spent with patient: 25 minutes   Past Medical History:  Past Medical History  Diagnosis Date  . Scoliosis   . MRSA (methicillin resistant staph aureus) culture positive   . Bipolar affective   . Depression   . Gonorrhea     Past Surgical History  Procedure Laterality Date  . Back surgery  2012    spinal fusion  . Back surgery  2012    MRSA infection- rods removed.  . Back surgery  2013    Rods put back in   Family History:  Family History  Problem Relation Age of Onset  . Suicidality Father     Committed suicide   Social History:  History  Alcohol Use  . Yes    Comment: occasionally     History  Drug Use  . Yes  . Special: Marijuana    Comment: couple times a month    History   Social History  . Marital Status: Single    Spouse Name: N/A  . Number of Children: N/A  . Years of Education: 12   Occupational History  . Unemployed    Social History Main Topics  . Smoking status: Former Games developer  . Smokeless tobacco: Never Used  . Alcohol Use: Yes     Comment: occasionally  . Drug Use: Yes    Special: Marijuana  Comment: couple times a month  . Sexual Activity: Yes    Birth Control/ Protection: None   Other Topics Concern  . None   Social History Narrative   Lives with mother. Single. Graduated high school. Currently unemployed.   Additional History:    Sleep: improved   Appetite:  Fair  Musculoskeletal: Strength & Muscle Tone: within normal limits Gait & Station: normal Patient leans: N/A  Psychiatric Specialty Exam: Physical Exam  ROS no headache, no nausea, no vomiting, denies lactation, denies breast pain , denies amenorrhea   Blood  pressure 99/62, pulse 94, temperature 99 F (37.2 C), temperature source Oral, resp. rate 16, height $RemoveBe'5\' 1"'RxyheOXNG$  (1.549 m), weight 171 lb 8 oz (77.792 kg), last menstrual period 12/01/2014.Body mass index is 32.42 kg/(m^2).  General Appearance: Fairly Groomed  Engineer, water::  Fair  Speech:  Clear and Coherent  Volume:  Decreased  Mood:  depressed, irritable   Affect:  Depressed and Labile  Thought Process:  Goal Directed and Linear  Orientation:  Full (Time, Place, and Person)  Thought Content:  Rumination- ruminative about disposition planning options as above , no hallucinations,no delusions  Suicidal Thoughts:  denies intent or plans  Homicidal Thoughts:  No  Memory:  Intact  Judgement:  Fair  Insight:  Fair  Psychomotor Activity:  Normal  Concentration:  Good  Recall:  Good  Fund of Knowledge:Good  Language: Good  Akathisia:  No  Handed:  Right  AIMS (if indicated):     Assets:  Desire for Improvement  ADL's:  Intact  Cognition: WNL  Sleep:  Number of Hours: 6.75   Current Medications: Current Facility-Administered Medications  Medication Dose Route Frequency Provider Last Rate Last Dose  . acetaminophen (TYLENOL) tablet 650 mg  650 mg Oral Q6H PRN Delfin Gant, NP      . alum & mag hydroxide-simeth (MAALOX/MYLANTA) 200-200-20 MG/5ML suspension 30 mL  30 mL Oral Q4H PRN Delfin Gant, NP      . benzocaine (ORAJEL) 10 % mucosal gel   Mouth/Throat TID PRN Encarnacion Slates, NP      . Derrill Memo ON 01/18/2015] FLUoxetine (PROZAC) capsule 20 mg  20 mg Oral Daily Myer Peer Cobos, MD      . hydrOXYzine (ATARAX/VISTARIL) tablet 25 mg  25 mg Oral Q6H PRN Delfin Gant, NP      . ibuprofen (ADVIL,MOTRIN) tablet 600 mg  600 mg Oral Q6H PRN Encarnacion Slates, NP      . LORazepam (ATIVAN) tablet 0.5 mg  0.5 mg Oral Q6H PRN Jenne Campus, MD   0.5 mg at 01/17/15 1646  . magnesium hydroxide (MILK OF MAGNESIA) suspension 30 mL  30 mL Oral Daily PRN Delfin Gant, NP      . traZODone  (DESYREL) tablet 50 mg  50 mg Oral QHS Delfin Gant, NP   50 mg at 01/13/15 2200  . ziprasidone (GEODON) capsule 20 mg  20 mg Oral BID WC Jenne Campus, MD   20 mg at 01/17/15 1713    Lab Results: No results found for this or any previous visit (from the past 48 hour(s)).  Physical Findings: AIMS: Facial and Oral Movements Muscles of Facial Expression: None, normal Lips and Perioral Area: None, normal Jaw: None, normal Tongue: None, normal,Extremity Movements Upper (arms, wrists, hands, fingers): None, normal Lower (legs, knees, ankles, toes): None, normal, Trunk Movements Neck, shoulders, hips: None, normal, Overall Severity Severity of abnormal movements (highest score from questions  above): None, normal Incapacitation due to abnormal movements: None, normal Patient's awareness of abnormal movements (rate only patient's report): No Awareness, Dental Status Current problems with teeth and/or dentures?: No Does patient usually wear dentures?: No  CIWA:    COWS:      Assessment - patient  Presents depressed, irritable, and angry about finding out that long term admission to [facility] not an option. She states she had been told she could go to [facility] for several weeks to months , but as per CSW , this is not an option- no long term inpatient psychiatric services available . Patient ruminates about this meaning she would need to go back home, which she states she would prefer not to do. She is anxious, tearful, but denies suicidal or self injurious ideations. Denies medication side effects .  I reviewed with patient elevated prolactin level and likely relationship to Geodon trial. She denies any amenorrhea, galactorrhea, or other associated symptoms and states she feels Geodon has helped, so is currently reluctant to stop it . I have stressed importance of continuing to monitor levels after discharge.   Treatment Plan Summary: Daily contact with patient to assess and evaluate  symptoms and progress in treatment and Medication management:   Plan:  Supportive approach/coping skills/relapse prevention           Continue PROZAC at 20 mgrs QDAY for depression and anxiety.           Start Ativan 0.5 mgrs Q 6 hours PRN for anxiety, due to increased level of anxiety she presents with.           Continue Trazodone 50 mg PO QHS for Insomnia as needed            Continue Geodon 20 mgrs BID for mood disorder / augmentation as above .           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

## 2015-01-17 NOTE — [facility] Group Notes (Signed)
[facility]  01/17/2015 8:45 AM  Participation Quality: Alert, Appropriate and Oriented  Mood/Affect: Appropriate  Depression Rating: 5  Anxiety Rating: 5  Thoughts of Suicide: Pt denies SI/HI  Will you contract for safety? Yes  Current AVH: Pt denies  Plan for Discharge/Comments: Pt attended discharge planning group and actively participated in group. CSW discussed suicide prevention education with the group and encouraged them to discuss discharge planning and any relevant barriers. Pt reports improving mood and sleep.  Transportation Means: Pt reports access to transportation  Supports: Mother  Chad CordialLauren Carter, Theresia MajorsLCSWA 01/17/2015 1:33 PM

## 2015-01-17 NOTE — Progress Notes (Signed)
Patient has been isolative to her room and did not, attend group this evening.Patient was informed of scheduled and prn medications and refused all. She c/o having nightmares on last night but did not share this information with Clinical research associatewriter until this evenig. She reports that she forgot to share this with her doctor on today also. She reports that she is waiting to be discharged so that she can go to [facility].  Patient currently denies having pain, -si/hi/a/v hall. Support and encouragement offered, safety maintained on unit, will continue to monitor.

## 2015-01-17 NOTE — Progress Notes (Signed)
D    Pt reported some anxiety at the beginning of the shift and requested medication for that   She brightens on approach but does endorse some depression and increased anxiety    She interacts well with others and has been compliant with groups A   Verbal support given   Medications administered and effectiveness monitored   Q 15 min checks   Discussed other ways to manage anxiety  R   Pt safe at present and was receptive to verbal support and instruction

## 2015-01-18 MED ORDER — TRAZODONE HCL 50 MG PO TABS: 50.0000 mg | ORAL_TABLET | Freq: Every day | ORAL | Status: DC

## 2015-01-18 MED ORDER — HYDROXYZINE HCL 25 MG PO TABS: 25.0000 mg | ORAL_TABLET | Freq: Four times a day (QID) | ORAL | Status: DC | PRN

## 2015-01-18 MED ORDER — FLUOXETINE HCL 20 MG PO CAPS: 20.0000 mg | ORAL_CAPSULE | Freq: Every day | ORAL | Status: DC

## 2015-01-18 MED ORDER — ZIPRASIDONE HCL 20 MG PO CAPS: 20.0000 mg | ORAL_CAPSULE | Freq: Two times a day (BID) | ORAL | Status: DC
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
## 2015-01-18 NOTE — Progress Notes (Signed)
Adult Psychoeducational Group Note  Date:  01/18/2015 Time:  0900  Group Topic/Focus:  Diagnosis Education:   The focus of this group is to discuss the major disorders that patients maybe diagnosed with.  Group discusses the importance of knowing what one's diagnosis is so that one can understand treatment and better advocate for oneself.  Participation Level:  Active  Participation Quality:  Appropriate, Attentive and Supportive  Affect:  Appropriate  Cognitive:  Alert and Appropriate  Insight: Appropriate and Good  Engagement in Group:  Developing/Improving  Modes of Intervention:  Discussion and Education  Additional Comments:  Patient to identify one goal for today.  Karen Kim, Karen Kim [facility] 01/18/2015, 10:09 AM

## 2015-01-18 NOTE — Progress Notes (Signed)
Pt d/c from hospital with a family member. All items returned. D/C instructions given, prescriptions given and samples given. Pt denies si and hi.

## 2015-01-18 NOTE — Progress Notes (Signed)
D:Pt reported that her gums and teeth felt sore this morning. Pt denies soreness in her throat or other areas. Pt states that she feels spirits "I cannot explain" when asked about hallucinations. Pt took her medication and ate breakfast that was brought to her room. A:Offered support, fluids, encouragement and 15 minute checks. R:Pt denies si and hi. Safety maintained on the unit.

## 2015-01-18 NOTE — Progress Notes (Signed)
  [facility] :  Will you be returning to the same living situation after discharge:  Yes,  Pt will return home with mother and [facility] At discharge, do you have transportation home?: Yes,  Pt's mother provided transportation Do you have the ability to pay for your medications: Yes,  Pt provided with samples and prescriptions  Release of information consent forms completed and in the chart;  Patient's signature needed at discharge.  Patient to Follow up at: Follow-up Information    Follow up with [facility].   Why:  A referral was made on 7/19. Please call the number listed below to have your medication and therapy appointments scheduled.   Contact information:   [address], [place], [place] [address] Phone: [phone]      Patient denies SI/HI: Yes,  Pt denies    Safety Planning and Suicide Prevention discussed: Yes, with Pt. See SPE note for futher details  Have you used any form of tobacco in the last 30 days? (Cigarettes, Smokeless Tobacco, Cigars, and/or Pipes): No  Has patient been referred to the Quitline?: N/A patient is not a smoker  Karen Kim, Karen Kim 01/18/2015, 3:30 PM
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
## 2015-01-19 NOTE — Clinical Social Work Note (Signed)
Pt notified of appt made at [facility] on 8/17 at 10 AM for hospital discharge follow up.  Santa GeneraAnne Aashi Derrington, LCSW Clinical Social Worker

## 2015-01-28 ENCOUNTER — Ambulatory Visit (HOSPITAL_COMMUNITY)
Admission: RE | Admit: 2015-01-28 | Discharge: 2015-01-28 | Disposition: A | Discharge status: Home / Self Care | Payer: 59 | Attending: Psychiatry | Admitting: Psychiatry

## 2015-01-28 DIAGNOSIS — F329 Major depressive disorder, single episode, unspecified: Secondary | ICD-10-CM | POA: Insufficient documentation

## 2015-01-28 DIAGNOSIS — F3189 Other bipolar disorder: Secondary | ICD-10-CM | POA: Insufficient documentation
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
## 2015-09-14 ENCOUNTER — Encounter (HOSPITAL_COMMUNITY): Payer: Self-pay | Admitting: Emergency Medicine

## 2015-09-14 ENCOUNTER — Emergency Department (HOSPITAL_COMMUNITY)
Admission: EM | Admit: 2015-09-14 | Discharge: 2015-09-14 | Disposition: A | Discharge status: Home / Self Care | Payer: BLUE CROSS/BLUE SHIELD | Attending: Emergency Medicine | Admitting: Emergency Medicine

## 2015-09-14 DIAGNOSIS — H9201 Otalgia, right ear: Secondary | ICD-10-CM | POA: Diagnosis present

## 2015-09-14 DIAGNOSIS — R51 Headache: Secondary | ICD-10-CM | POA: Insufficient documentation

## 2015-09-14 MED ORDER — IBUPROFEN 200 MG PO TABS
400.0000 mg | ORAL_TABLET | Freq: Once | ORAL | Status: AC
  Administered 2015-09-14: 400 mg via ORAL
  Filled 2015-09-14: qty 2

## 2015-09-14 NOTE — ED Notes (Addendum)
Patient presents for right ear pain/drainage, generalized body aches, headache x2 days. Denies fever/chills, N/V, visual changes, lightheadedness or dizziness. No cerumen impaction or foreign object seen, clear drainage noted to right ear.

## 2015-09-14 NOTE — ED Notes (Signed)
Patient did not want to stay. They said they want to go home.

## 2015-11-21 ENCOUNTER — Encounter (HOSPITAL_COMMUNITY): Payer: Self-pay | Admitting: *Deleted

## 2015-11-21 ENCOUNTER — Emergency Department (HOSPITAL_COMMUNITY)
Admission: EM | Admit: 2015-11-21 | Discharge: 2015-11-21 | Disposition: A | Discharge status: Home / Self Care | Payer: BLUE CROSS/BLUE SHIELD | Attending: Emergency Medicine | Admitting: Emergency Medicine

## 2015-11-21 ENCOUNTER — Emergency Department (HOSPITAL_COMMUNITY): Payer: BLUE CROSS/BLUE SHIELD

## 2015-11-21 DIAGNOSIS — Z87891 Personal history of nicotine dependence: Secondary | ICD-10-CM | POA: Insufficient documentation

## 2015-11-21 DIAGNOSIS — F319 Bipolar disorder, unspecified: Secondary | ICD-10-CM | POA: Diagnosis not present

## 2015-11-21 DIAGNOSIS — R05 Cough: Secondary | ICD-10-CM

## 2015-11-21 DIAGNOSIS — Z8739 Personal history of other diseases of the musculoskeletal system and connective tissue: Secondary | ICD-10-CM | POA: Diagnosis not present

## 2015-11-21 DIAGNOSIS — Z8614 Personal history of Methicillin resistant Staphylococcus aureus infection: Secondary | ICD-10-CM | POA: Diagnosis not present

## 2015-11-21 DIAGNOSIS — Z8619 Personal history of other infectious and parasitic diseases: Secondary | ICD-10-CM | POA: Insufficient documentation

## 2015-11-21 DIAGNOSIS — B349 Viral infection, unspecified: Secondary | ICD-10-CM | POA: Diagnosis not present

## 2015-11-21 DIAGNOSIS — Z79899 Other long term (current) drug therapy: Secondary | ICD-10-CM | POA: Diagnosis not present

## 2015-11-21 DIAGNOSIS — R059 Cough, unspecified: Secondary | ICD-10-CM

## 2015-11-21 DIAGNOSIS — Z3202 Encounter for pregnancy test, result negative: Secondary | ICD-10-CM | POA: Insufficient documentation

## 2015-11-21 DIAGNOSIS — R112 Nausea with vomiting, unspecified: Secondary | ICD-10-CM

## 2015-11-21 LAB — CBC
HEMATOCRIT: 39.7 % (ref 36.0–46.0)
Hemoglobin: 13 g/dL (ref 12.0–15.0)
MCH: 29.8 pg (ref 26.0–34.0)
MCHC: 32.7 g/dL (ref 30.0–36.0)
MCV: 91.1 fL (ref 78.0–100.0)
Platelets: 238 10*3/uL (ref 150–400)
RBC: 4.36 MIL/uL (ref 3.87–5.11)
RDW: 13.7 % (ref 11.5–15.5)
WBC: 8 10*3/uL (ref 4.0–10.5)

## 2015-11-21 LAB — I-STAT BETA HCG BLOOD, ED (MC, WL, AP ONLY): I-stat hCG, quantitative: <5 m[IU]/mL (ref <5)

## 2015-11-21 LAB — COMPREHENSIVE METABOLIC PANEL
ALBUMIN: 3.6 g/dL (ref 3.5–5.0)
ALT: 11 U/L — ABNORMAL LOW (ref 14–54)
AST: 14 U/L — ABNORMAL LOW (ref 15–41)
Alkaline Phosphatase: 43 U/L (ref 38–126)
Anion gap: 5 (ref 5–15)
BUN: 11 mg/dL (ref 6–20)
CHLORIDE: 107 mmol/L (ref 101–111)
CO2: 25 mmol/L (ref 22–32)
Calcium: 9 mg/dL (ref 8.9–10.3)
Creatinine, Ser: 0.55 mg/dL (ref 0.44–1.00)
GFR calc Af Amer: >60 mL/min (ref >60)
GFR calc non Af Amer: >60 mL/min (ref >60)
Glucose, Bld: 94 mg/dL (ref 65–99)
POTASSIUM: 3.8 mmol/L (ref 3.5–5.1)
SODIUM: 137 mmol/L (ref 135–145)
Total Bilirubin: 0.4 mg/dL (ref 0.3–1.2)
Total Protein: 6.9 g/dL (ref 6.5–8.1)

## 2015-11-21 LAB — LIPASE, BLOOD: Lipase: 30 U/L (ref 11–51)

## 2015-11-21 MED ORDER — BENZONATATE 100 MG PO CAPS: 100.0000 mg | ORAL_CAPSULE | Freq: Three times a day (TID) | ORAL | Status: DC

## 2015-11-21 MED ORDER — KETOROLAC TROMETHAMINE 30 MG/ML IJ SOLN
30.0000 mg | Freq: Once | INTRAMUSCULAR | Status: AC
  Administered 2015-11-21: 30 mg via INTRAVENOUS
  Filled 2015-11-21: qty 1

## 2015-11-21 MED ORDER — IBUPROFEN 800 MG PO TABS: 800.0000 mg | ORAL_TABLET | Freq: Three times a day (TID) | ORAL | Status: DC

## 2015-11-21 MED ORDER — ONDANSETRON 4 MG PO TBDP: 4.0000 mg | ORAL_TABLET | Freq: Three times a day (TID) | ORAL | Status: DC | PRN

## 2015-11-21 MED ORDER — SODIUM CHLORIDE 0.9 % IV BOLUS (SEPSIS)
1000.0000 mL | Freq: Once | INTRAVENOUS | Status: AC
  Administered 2015-11-21: 1000 mL via INTRAVENOUS

## 2015-11-21 MED ORDER — AZITHROMYCIN 250 MG PO TABS: 250.0000 mg | ORAL_TABLET | Freq: Every day | ORAL | Status: DC

## 2015-11-21 NOTE — ED Provider Notes (Signed)
CSN: [id number]     Arrival date & time 11/21/15  1317 History  By signing my name below, I, Karen Kim, attest that this documentation has been prepared under the direction and in the presence of Karen Liby, PA-C. Electronically Signed: Octavia Kim, ED Scribe. 11/21/2015. 2:25 PM.    Chief Complaint  Patient presents with  . Cough  . Emesis  . Headache      The history is provided by the patient. No language interpreter was used.   HPI Comments: Karen Kim is a 20 y.o. female who has a PMhx of pneumonia, scoliosis, MRSA, bipolar affective, depression and gonorrhea presents to the Emergency Department complaining of constant, gradual worsening, moderate, productive cough onset two weeks ago. She reports having associated sore throat, vomiting (3 x daily), hot flashes, nasal congestion, fatigue, and headaches. Pt says she took some Mucinex to alleviate her symptoms with no relief. She denies chest pain, SOB, headache, fever, generalized body aches. Pt is a smoker and smokes about .5 packs a day.   Past Medical History  Diagnosis Date  . Scoliosis   . MRSA (methicillin resistant staph aureus) culture positive   . Bipolar affective (HCC)   . Depression   . Gonorrhea    Past Surgical History  Procedure Laterality Date  . Back surgery  2012    spinal fusion  . Back surgery  2012    MRSA infection- rods removed.  . Back surgery  2013    Rods put back in   Family History  Problem Relation Age of Onset  . Suicidality Father     Committed suicide   Social History  Substance Use Topics  . Smoking status: Former Games developer  . Smokeless tobacco: Never Used  . Alcohol Use: Yes     Comment: occasionally   OB History    No data available     Review of Systems  A complete 10 system review of systems was obtained and all systems are negative except as noted in the HPI and PMH.    Allergies  Review of patient's allergies indicates no known allergies.  Home Medications    Prior to Admission medications   Medication Sig Start Date End Date Taking? Authorizing Provider  FLUoxetine (PROZAC) 20 MG capsule Take 1 capsule (20 mg total) by mouth daily. For depression 01/18/15   [name], NP  hydrOXYzine (ATARAX/VISTARIL) 25 MG tablet Take 1 tablet (25 mg total) by mouth every 6 (six) hours as needed for anxiety. 01/18/15   [name], NP  traZODone (DESYREL) 50 MG tablet Take 1 tablet (50 mg total) by mouth at bedtime. For sleep 01/18/15   [name], NP  ziprasidone (GEODON) 20 MG capsule Take 1 capsule (20 mg total) by mouth 2 (two) times daily with a meal. For mood control 01/18/15   [name], NP   Triage vitals: BP 117/70 mmHg  Pulse 84  Temp(Src) 98.5 F (36.9 C) (Oral)  Resp 16  SpO2 99%  LMP 11/21/2015 Physical Exam  Constitutional: She is oriented to person, place, and time. She appears well-developed and well-nourished.  HENT:  Head: Normocephalic.  Mouth/Throat: Oropharynx is clear and moist.  Eyes: Conjunctivae and EOM are normal.  Neck: Normal range of motion.  Cardiovascular: Normal rate, regular rhythm and normal heart sounds.   Pulmonary/Chest: Effort normal and breath sounds normal. She has no wheezes. She has no rales.  Abdominal: Soft. She exhibits no distension. There is no tenderness.  Musculoskeletal: Normal range of motion.  Neurological: She is alert and oriented to person, place, and time.  Psychiatric: She has a normal mood and affect.  Nursing note and vitals reviewed.   ED Course  Procedures  DIAGNOSTIC STUDIES: Oxygen Saturation is 99% on RA, normal by my interpretation.  COORDINATION OF CARE:  2:23 PM Will order CXR. Discussed treatment plan with pt at bedside and pt agreed to plan.  Labs Review Labs Reviewed  COMPREHENSIVE METABOLIC PANEL - Abnormal; Notable for the following:    AST 14 (*)    ALT 11 (*)    All other components within normal limits  LIPASE, BLOOD  CBC  I-STAT BETA HCG BLOOD, ED (MC,  WL, AP ONLY)    Imaging Review Dg Chest 2 View  11/21/2015  CLINICAL DATA:  Three weeks of cough, chest pressure and vomiting. EXAM: CHEST  2 VIEW COMPARISON:  Chest x-ray dated 05/22/2014. FINDINGS: Heart size is normal. Lungs are clear. No evidence of pneumonia. No pleural effusion or pneumothorax seen. Extensive spinal fusion hardware for the thoracolumbar scoliosis appears intact and stable in alignment. No acute -appearing osseous abnormality. IMPRESSION: Lungs are clear and there is no evidence of acute cardiopulmonary abnormality. Electronically Signed   By: Bary RichardStan  Maynard M.D.   On: 11/21/2015 14:55   I have personally reviewed and evaluated these images and lab results as part of my medical decision-making.   EKG Interpretation None      MDM   Final diagnoses:  Viral syndrome  Cough  Non-intractable vomiting with nausea, vomiting of unspecified type    CXR negative. Labs unremarkable. Likely viral etiology. Pt denies nausea in the ED and is tolerating PO. Feels imiproved after toradol and IV fluid bolus. Rx given for supportive meds. Given pt lack of PCP for f/u will give rx for z-pack to hold on to. Instructed to only fill if her symptoms do not improve over the next few days. Resource guide given. ER return precautions given.  I personally performed the services described in this documentation, which was scribed in my presence. The recorded information has been reviewed and is accurate.   Karen CoriaSerena Y Clifton Kovacic, PA-C 11/21/15 1733  Karen LovelessScott Goldston, MD 11/22/15 [phone]

## 2015-11-21 NOTE — Discharge Instructions (Signed)
Start the z-pack only if your symptoms do not improve over the next few days. Otherwise take the other medications as prescribed. Return to the ER for new or worsening symptoms.

## 2015-11-21 NOTE — ED Notes (Signed)
Pt reports not feeling well x 2 weeks. Having cough, congestion, n/v, fatigue and headache. noa cute distress noted at triage.

## 2015-11-21 NOTE — ED Notes (Signed)
Pt. Transported to xray at this time.  

## 2015-12-08 ENCOUNTER — Emergency Department (HOSPITAL_COMMUNITY)
Admission: EM | Admit: 2015-12-08 | Discharge: 2015-12-09 | Discharge status: Against Medical Advice | Payer: BLUE CROSS/BLUE SHIELD | Attending: Emergency Medicine | Admitting: Emergency Medicine

## 2015-12-08 DIAGNOSIS — Z87891 Personal history of nicotine dependence: Secondary | ICD-10-CM | POA: Insufficient documentation

## 2015-12-08 DIAGNOSIS — F1910 Other psychoactive substance abuse, uncomplicated: Secondary | ICD-10-CM

## 2015-12-08 DIAGNOSIS — F1920 Other psychoactive substance dependence, uncomplicated: Secondary | ICD-10-CM | POA: Insufficient documentation

## 2015-12-08 DIAGNOSIS — F319 Bipolar disorder, unspecified: Secondary | ICD-10-CM | POA: Insufficient documentation

## 2015-12-08 NOTE — ED Notes (Addendum)
Patient presents for detox (last used meth today). C/o nausea, generalized abdominal pain, lower back pain, urinary frequency (endorses odor and color change),  HA. Patient states she felt her friends were talking about her earlier. Denies AVH, SI/HI.

## 2015-12-09 ENCOUNTER — Encounter (HOSPITAL_COMMUNITY): Payer: Self-pay | Admitting: Emergency Medicine

## 2015-12-09 LAB — URINE MICROSCOPIC-ADD ON: RBC / HPF: NONE SEEN RBC/hpf (ref 0–5)

## 2015-12-09 LAB — RAPID URINE DRUG SCREEN, HOSP PERFORMED
AMPHETAMINES: POSITIVE — AB
BENZODIAZEPINES: NOT DETECTED
Barbiturates: NOT DETECTED
Cocaine: NOT DETECTED
OPIATES: NOT DETECTED
TETRAHYDROCANNABINOL: NOT DETECTED

## 2015-12-09 LAB — URINALYSIS, ROUTINE W REFLEX MICROSCOPIC
GLUCOSE, UA: NEGATIVE mg/dL
HGB URINE DIPSTICK: NEGATIVE
Ketones, ur: >80 mg/dL — AB
Nitrite: NEGATIVE
PH: 6 (ref 5.0–8.0)
Protein, ur: NEGATIVE mg/dL
SPECIFIC GRAVITY, URINE: 1.031 — AB (ref 1.005–1.030)

## 2015-12-09 LAB — COMPREHENSIVE METABOLIC PANEL
ALT: 14 U/L (ref 14–54)
AST: 21 U/L (ref 15–41)
Albumin: 4.8 g/dL (ref 3.5–5.0)
Alkaline Phosphatase: 46 U/L (ref 38–126)
Anion gap: 9 (ref 5–15)
BUN: 12 mg/dL (ref 6–20)
CO2: 24 mmol/L (ref 22–32)
Calcium: 9.7 mg/dL (ref 8.9–10.3)
Chloride: 103 mmol/L (ref 101–111)
Creatinine, Ser: 0.7 mg/dL (ref 0.44–1.00)
GFR calc Af Amer: >60 mL/min (ref >60)
GFR calc non Af Amer: >60 mL/min (ref >60)
Glucose, Bld: 119 mg/dL — ABNORMAL HIGH (ref 65–99)
Potassium: 3.2 mmol/L — ABNORMAL LOW (ref 3.5–5.1)
Sodium: 136 mmol/L (ref 135–145)
Total Bilirubin: 1.3 mg/dL — ABNORMAL HIGH (ref 0.3–1.2)
Total Protein: 8.6 g/dL — ABNORMAL HIGH (ref 6.5–8.1)

## 2015-12-09 LAB — CBC
HCT: 44 % (ref 36.0–46.0)
Hemoglobin: 15 g/dL (ref 12.0–15.0)
MCH: 30.8 pg (ref 26.0–34.0)
MCHC: 34.1 g/dL (ref 30.0–36.0)
MCV: 90.3 fL (ref 78.0–100.0)
Platelets: 262 10*3/uL (ref 150–400)
RBC: 4.87 MIL/uL (ref 3.87–5.11)
RDW: 13.6 % (ref 11.5–15.5)
WBC: 9.7 10*3/uL (ref 4.0–10.5)

## 2015-12-09 LAB — SALICYLATE LEVEL

## 2015-12-09 LAB — ETHANOL

## 2015-12-09 LAB — ACETAMINOPHEN LEVEL: Acetaminophen (Tylenol), Serum: <10 ug/mL — ABNORMAL LOW (ref 10–30)

## 2015-12-09 NOTE — ED Provider Notes (Signed)
CSN: [id number]     Arrival date & time 12/08/15  2349 History   By signing my name below, I, Karen Kim. Karen Kim, attest that this documentation has been prepared under the direction and in the presence of Karen Spates, MD.  Electronically Signed: Suzan Kim. Karen Kim, ED Scribe. 12/09/2015. 1:34 AM.   Chief Complaint  Patient presents with  . Drug / Alcohol Assessment   The history is provided by the patient. No language interpreter was used.    HPI Comments: Karen Kim is a 20 y.o. female with a PMHx of depression and Bipolar affective disorder who presents to the Emergency Department here for drug detoxification this evening. Pt states she has consistently been struggling with drugs for the past several months. Recently, she admits to abusing Methamphetamine several times a day x 2 weeks, Opana prescription tabs x 2 weeks, and heroin with last use 3 weeks ago. She also reports auditory hallucinations, with voices talking to her but they are not telling her to do things. Mother reports several previous suicide attempts. She denies visual hallucinations, SI, or HI. She is not currently on an prescribed psychiatric medications.  Pt also reports constant, ongoing urinary frequency and foul smelling odor x few days. No recent fever or chills.  PCP: No PCP Per Patient    Past Medical History  Diagnosis Date  . Scoliosis   . MRSA (methicillin resistant staph aureus) culture positive   . Bipolar affective (HCC)   . Depression   . Gonorrhea    Past Surgical History  Procedure Laterality Date  . Back surgery  2012    spinal fusion  . Back surgery  2012    MRSA infection- rods removed.  . Back surgery  2013    Rods put back in   Family History  Problem Relation Age of Onset  . Suicidality Father     Committed suicide   Social History  Substance Use Topics  . Smoking status: Former Games developer  . Smokeless tobacco: Never Used  . Alcohol Use: Yes     Comment: occasionally   OB  History    No data available     Review of Systems  A complete 10 system review of systems was obtained and all systems are negative except as noted in the HPI and PMH.    Allergies  Review of patient's allergies indicates no known allergies.  Home Medications   Prior to Admission medications   Medication Sig Start Date End Date Taking? Authorizing Provider  azithromycin (ZITHROMAX) 250 MG tablet Take 1 tablet (250 mg total) by mouth daily. Take first 2 tablets together, then 1 every day until finished. Start on 5/25 if symptoms are still not improving by then. Patient not taking: Reported on 12/09/2015 11/24/15   Ace Gins Sam, PA-C  benzonatate (TESSALON) 100 MG capsule Take 1 capsule (100 mg total) by mouth every 8 (eight) hours. Patient not taking: Reported on 12/09/2015 11/21/15   Ace Gins Sam, PA-C  FLUoxetine (PROZAC) 20 MG capsule Take 1 capsule (20 mg total) by mouth daily. For depression Patient not taking: Reported on 12/09/2015 01/18/15   Karen Kava, NP  hydrOXYzine (ATARAX/VISTARIL) 25 MG tablet Take 1 tablet (25 mg total) by mouth every 6 (six) hours as needed for anxiety. Patient not taking: Reported on 12/09/2015 01/18/15   Karen Kava, NP  ibuprofen (ADVIL,MOTRIN) 800 MG tablet Take 1 tablet (800 mg total) by mouth 3 (three) times daily. Patient not taking:  Reported on 12/09/2015 11/21/15   Ace GinsSerena Y Sam, PA-C  ondansetron (ZOFRAN ODT) 4 MG disintegrating tablet Take 1 tablet (4 mg total) by mouth every 8 (eight) hours as needed for nausea or vomiting. Patient not taking: Reported on 12/09/2015 11/21/15   Ace GinsSerena Y Sam, PA-C  traZODone (DESYREL) 50 MG tablet Take 1 tablet (50 mg total) by mouth at bedtime. For sleep Patient not taking: Reported on 12/09/2015 01/18/15   Karen KavaAgnes I Nwoko, NP  ziprasidone (GEODON) 20 MG capsule Take 1 capsule (20 mg total) by mouth 2 (two) times daily with a meal. For mood control Patient not taking: Reported on 12/09/2015 01/18/15   Karen KavaAgnes I Nwoko, NP   Triage  Vitals: BP 102/68 mmHg  Pulse 107  Temp(Src) 98.1 F (36.7 C) (Oral)  Resp 18  SpO2 100%  LMP 11/21/2015   Physical Exam  Constitutional: She is oriented to person, place, and time. She appears well-developed and well-nourished. No distress.  HENT:  Head: Normocephalic and atraumatic.  Moist mucous membranes  Eyes: Conjunctivae are normal. Pupils are equal, round, and reactive to light.  Neck: Neck supple.  Cardiovascular: Normal rate, regular rhythm and normal heart sounds.   No murmur heard. Pulmonary/Chest: Effort normal and breath sounds normal.  Abdominal: Soft. Bowel sounds are normal. She exhibits no distension. There is no tenderness.  Musculoskeletal: She exhibits no edema.  Neurological: She is alert and oriented to person, place, and time.  Fluent speech  Skin: Skin is warm and dry.  Healed linear scars on R volar forearm  Psychiatric: She has a normal mood and affect.  Calm, cooperative  Nursing note and vitals reviewed.   ED Course  Procedures (including critical care time)  DIAGNOSTIC STUDIES: Oxygen Saturation is 100% on RA, Normal by my interpretation.    COORDINATION OF CARE: 1:31 AM- Will order blood work and urinalysis. Discussed treatment plan with pt at bedside and pt agreed to plan.     Labs Review Labs Reviewed  COMPREHENSIVE METABOLIC PANEL - Abnormal; Notable for the following:    Potassium 3.2 (*)    Glucose, Bld 119 (*)    Total Protein 8.6 (*)    Total Bilirubin 1.3 (*)    All other components within normal limits  ACETAMINOPHEN LEVEL - Abnormal; Notable for the following:    Acetaminophen (Tylenol), Serum <10 (*)    All other components within normal limits  URINE RAPID DRUG SCREEN, HOSP PERFORMED - Abnormal; Notable for the following:    Amphetamines POSITIVE (*)    All other components within normal limits  URINALYSIS, ROUTINE W REFLEX MICROSCOPIC (NOT AT [facility]) - Abnormal; Notable for the following:    Color, Urine AMBER (*)     APPearance TURBID (*)    Specific Gravity, Urine 1.031 (*)    Bilirubin Urine SMALL (*)    Ketones, ur >80 (*)    Leukocytes, UA SMALL (*)    All other components within normal limits  URINE MICROSCOPIC-ADD ON - Abnormal; Notable for the following:    Squamous Epithelial / LPF 0-5 (*)    Bacteria, UA RARE (*)    Crystals CA OXALATE CRYSTALS (*)    All other components within normal limits  ETHANOL  SALICYLATE LEVEL  CBC    Imaging Review No results found. I have personally reviewed and evaluated theselab results as part of my medical decision-making.   EKG Interpretation None      MDM   Final diagnoses:  None   Patient  presents requesting detox with history of polysubstance abuse. She was well appearing on exam with normal VS. No focal abnormalities on exam. No SI, HI or concerning symptoms of psychosis. Labs show UA without evidence of infection, otherwise reassuring labs. Pt left AMA prior to finalized UA results and list of outpatient resources.   I personally performed the services described in this documentation, which was scribed in my presence. The recorded information has been reviewed and is accurate.   Karen Spates, MD 12/09/15 [phone]

## 2015-12-09 NOTE — ED Notes (Addendum)
Visitor and patient state they are leaving. Patient in NAD, A&O x4, ambulatory with steady gait.

## 2015-12-16 IMAGING — CR DG CHEST 2V
2 series · 2 of 2 positions shown · non-contrast
Comparison: Radiographs 04/09/2014 and 08/10/2011.

CLINICAL DATA: Altered mental status. Possible overdose. Initial
encounter.

EXAM:
CHEST  2 VIEW

[w chest lat]
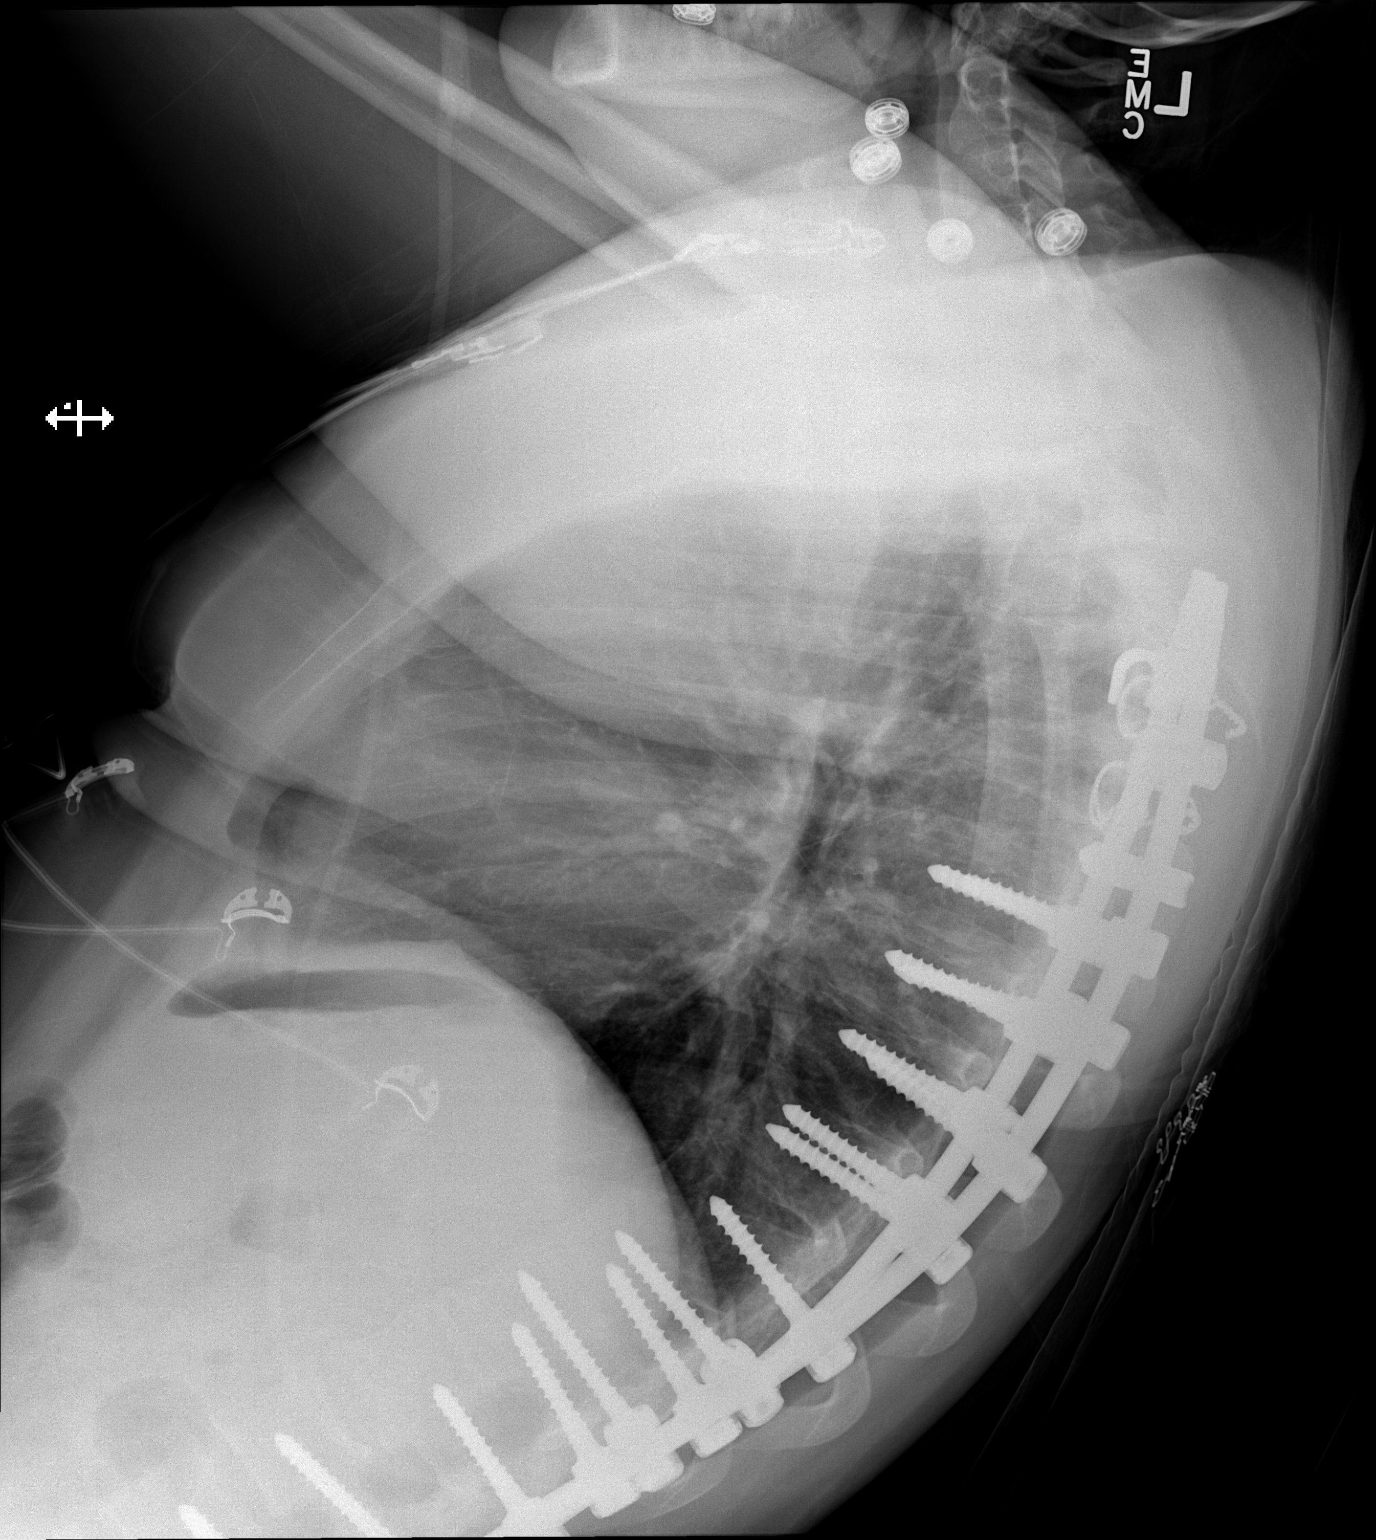

[x chest ap]
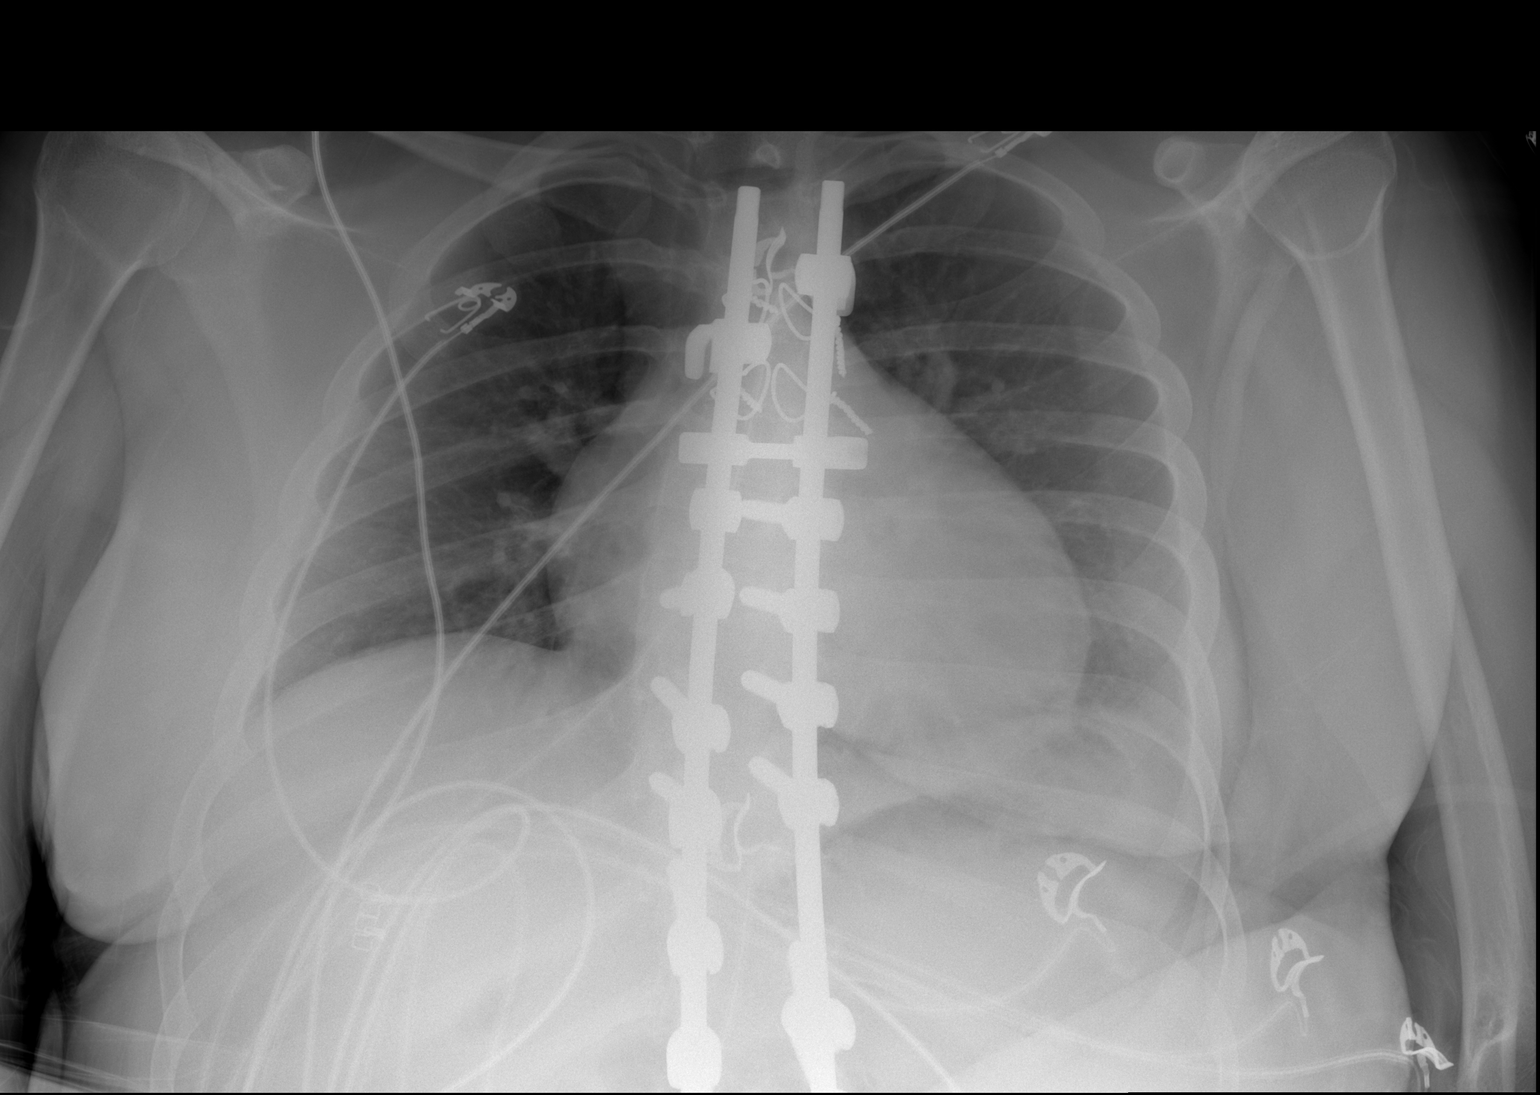

[2 of 2 positions shown; findings below may reference images not displayed]

FINDINGS: The heart size and mediastinal contours are stable. The lungs are
clear. There is no pleural effusion or pneumothorax. Thoracolumbar
spinal fixation rods are grossly stable, incompletely visualized. A
mild residual scoliosis appears unchanged. No acute osseous findings
are evident.
IMPRESSION: No active cardiopulmonary process. Spinal fixation rods appear
unchanged.

## 2016-02-29 ENCOUNTER — Encounter (HOSPITAL_COMMUNITY): Payer: Self-pay | Admitting: *Deleted

## 2016-02-29 ENCOUNTER — Emergency Department (HOSPITAL_COMMUNITY)
Admission: EM | Admit: 2016-02-29 | Discharge: 2016-02-29 | Disposition: A | Discharge status: Home / Self Care | Payer: BLUE CROSS/BLUE SHIELD | Attending: Emergency Medicine | Admitting: Emergency Medicine

## 2016-02-29 DIAGNOSIS — Z87891 Personal history of nicotine dependence: Secondary | ICD-10-CM | POA: Diagnosis not present

## 2016-02-29 DIAGNOSIS — Z79899 Other long term (current) drug therapy: Secondary | ICD-10-CM | POA: Insufficient documentation

## 2016-02-29 DIAGNOSIS — M545 Low back pain, unspecified: Secondary | ICD-10-CM

## 2016-02-29 MED ORDER — METHOCARBAMOL 500 MG PO TABS: 500.0000 mg | ORAL_TABLET | Freq: Two times a day (BID) | ORAL | 0 refills | Status: DC

## 2016-02-29 MED ORDER — NAPROXEN 500 MG PO TABS: 500.0000 mg | ORAL_TABLET | Freq: Two times a day (BID) | ORAL | 0 refills | Status: DC

## 2016-02-29 MED ORDER — LIDOCAINE 5 % EX PTCH: 1.0000 | MEDICATED_PATCH | CUTANEOUS | 0 refills | Status: DC

## 2016-02-29 MED ORDER — KETOROLAC TROMETHAMINE 60 MG/2ML IM SOLN
60.0000 mg | Freq: Once | INTRAMUSCULAR | Status: AC
  Administered 2016-02-29: 60 mg via INTRAMUSCULAR
  Filled 2016-02-29: qty 2

## 2016-02-29 NOTE — ED Notes (Signed)
Pt is in stable condition upon d/c and ambulates from ED. 

## 2016-02-29 NOTE — ED Triage Notes (Signed)
Pt states she was working in the yard and felt something pop in her lower back, states she is still in pain. Pt denies urinary or bowel changes.

## 2016-02-29 NOTE — ED Provider Notes (Signed)
[facility]-EMERG[facility]Y DEPT Provider Note   CSN: [id number] Arrival date & time: 02/29/16  1411  By signing my name below, I, Karen Kim, attest that this documentation has been prepared under the direction and in the presence of non-physician practitioner, Harolyn Rutherford, PA-C. Electronically Signed: Modena Kim, Scribe. 02/29/2016. 2:29 PM.  History   Chief Complaint Chief Complaint  Patient presents with  . Back Pain   The history is provided by the patient. No language interpreter was used.   HPI Comments: Karen Kim is a 20 y.o. female with a hx of scoliosis who presents to the Emergency Department complaining of constant moderate mid/lower back pain that started 2 days ago. She currently rates the pain as 9/10. She began having pain after doing some heavy lifting. She reports having a scoliosis surgery when she was age 80. She denies any fever, bowel/bladder incontinence, neuro deficits, or any other complaints.  Past Medical History:  Diagnosis Date  . Bipolar affective (HCC)   . Depression   . Gonorrhea   . MRSA (methicillin resistant staph aureus) culture positive   . Scoliosis     Patient Active Problem List   Diagnosis Date Noted  . 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 MDD (major depressive disorder), recurrent severe, without psychosis (HCC) 01/13/2015  . Suicidal thoughts   . MDD (major depressive disorder), recurrent, severe, with psychosis (HCC) 12/31/2014  . History of bipolar disorder 12/31/2014  . PTSD (post-traumatic stress disorder) 12/31/2014  . Altered mental status 05/23/2014  . Overdose 05/23/2014  . Metabolic encephalopathy 05/23/2014  . Hypotension, unspecified 01/17/2014  . Bradycardia 01/17/2014  . T wave inversion in EKG 01/17/2014  . Drug overdose 01/16/2014  . Gonorrhea     Past Surgical History:  Procedure Laterality Date  . BACK SURGERY  2012   spinal fusion  . BACK SURGERY  2012   MRSA infection- rods removed.  Marland Kitchen BACK SURGERY  2013   Rods put back in    OB History    No data available       Home Medications    Prior to Admission medications   Medication Sig Start Date End Date Taking? Authorizing Provider  azithromycin (ZITHROMAX) 250 MG tablet Take 1 tablet (250 mg total) by mouth daily. Take first 2 tablets together, then 1 every day until finished. Start on 5/25 if symptoms are still not improving by then. Patient not taking: Reported on 12/09/2015 11/24/15   Ace Gins Sam, PA-C  benzonatate (TESSALON) 100 MG capsule Take 1 capsule (100 mg total) by mouth every 8 (eight) hours. Patient not taking: Reported on 12/09/2015 11/21/15   Ace Gins Sam, PA-C  FLUoxetine (PROZAC) 20 MG capsule Take 1 capsule (20 mg total) by mouth daily. For depression Patient not taking: Reported on 12/09/2015 01/18/15   [name], NP  hydrOXYzine (ATARAX/VISTARIL) 25 MG tablet Take 1 tablet (25 mg total) by mouth every 6 (six) hours as needed for anxiety. Patient not taking: Reported on 12/09/2015 01/18/15   [name], NP  ibuprofen (ADVIL,MOTRIN) 800 MG tablet Take 1 tablet (800 mg total) by mouth 3 (three) times daily. Patient not taking: Reported on 12/09/2015 11/21/15   Ace Gins Sam, PA-C  lidocaine (LIDODERM) 5 % Place 1 patch onto the skin daily. Remove & Discard patch within 12 hours or as directed by MD 02/29/16   Anselm Pancoast, PA-C  methocarbamol (ROBAXIN) 500 MG tablet Take 1 tablet (500 mg total)  by mouth 2 (two) times daily. 02/29/16   Shawn C Joy, PA-C  naproxen (NAPROSYN) 500 MG tablet Take 1 tablet (500 mg total) by mouth 2 (two) times daily. 02/29/16   Shawn C Joy, PA-C  ondansetron (ZOFRAN ODT) 4 MG disintegrating tablet Take 1 tablet (4 mg total) by mouth every 8 (eight) hours as needed for nausea or vomiting. Patient not taking: Reported on 12/09/2015 11/21/15   Ace Gins Sam, PA-C  traZODone (DESYREL) 50 MG tablet Take 1 tablet (50 mg total) by mouth at bedtime. For sleep Patient  not taking: Reported on 12/09/2015 01/18/15   [name], NP  ziprasidone (GEODON) 20 MG capsule Take 1 capsule (20 mg total) by mouth 2 (two) times daily with a meal. For mood control Patient not taking: Reported on 12/09/2015 01/18/15   [name], NP    Family History Family History  Problem Relation Age of Onset  . Suicidality Father     Committed suicide    Social History Social History  Substance Use Topics  . Smoking status: Former Games developer  . Smokeless tobacco: Never Used  . Alcohol use Yes     Comment: occasionally     Allergies   Review of patient's allergies indicates no known allergies.   Review of Systems Review of Systems  Constitutional: Negative for fever.  Musculoskeletal: Positive for back pain.  Neurological: Negative for weakness and numbness.     Physical Exam Updated Vital Signs BP 114/65 (BP Location: Right Arm)   Pulse 83   Temp 98.4 F (36.9 C) (Oral)   Resp 14   SpO2 100%   Physical Exam  Constitutional: She appears well-developed and well-nourished. No distress.  HENT:  Head: Normocephalic and atraumatic.  Eyes: Conjunctivae are normal.  Neck: Neck supple.  Cardiovascular: Normal rate and regular rhythm.   Pulmonary/Chest: Effort normal.  Musculoskeletal:  TTP to the left lumbar and thoracic musculature. No midline spinal TTP. Full ROM in lower extremities.  Neurological: She is alert. She has normal reflexes.  No sensory deficits. Strength 5/5 in lower extremities. No gait disturbance. Coordination intact.   Skin: Skin is warm and dry. She is not diaphoretic.  Psychiatric: She has a normal mood and affect. Her behavior is normal.  Nursing note and vitals reviewed.    ED Treatments / Results  DIAGNOSTIC STUDIES: Oxygen Saturation is 100% on RA, normal by my interpretation.    COORDINATION OF CARE: 2:33 PM- Pt advised of plan for treatment, which includes medication, and pt agrees.  Labs (all labs ordered are listed, but  only abnormal results are displayed) Labs Reviewed - No data to display  EKG  EKG Interpretation None       Radiology No results found.  Procedures Procedures (including critical care time)  Medications Ordered in ED Medications  ketorolac (TORADOL) injection 60 mg (not administered)     Initial Impression / Assessment and Plan / ED Course  I have reviewed the triage vital signs and the nursing notes.  Pertinent labs & imaging results that were available during my care of the patient were reviewed by me and considered in my medical decision making (see chart for details).  Clinical Course    AITHANA KUSHNER presents with mid and lower left back pain beginning 2 days ago after an episode of heavy lifting. Patient has no neuro or functional deficits. Suspect muscle strain. No symptoms of cauda equina or epidural abscess. The patient was given instructions for home care  as well as return precautions. Patient voices understanding of these instructions, accepts the plan, and is comfortable with discharge.  Final Clinical Impressions(s) / ED Diagnoses   Final diagnoses:  Left-sided low back pain without sciatica    New Prescriptions New Prescriptions   LIDOCAINE (LIDODERM) 5 %    Place 1 patch onto the skin daily. Remove & Discard patch within 12 hours or as directed by MD   METHOCARBAMOL (ROBAXIN) 500 MG TABLET    Take 1 tablet (500 mg total) by mouth 2 (two) times daily.   NAPROXEN (NAPROSYN) 500 MG TABLET    Take 1 tablet (500 mg total) by mouth 2 (two) times daily.   I personally performed the services described in this documentation, which was scribed in my presence. The recorded information has been reviewed and is accurate.    Anselm PancoastShawn C Joy, PA-C 02/29/16 1443    Geoffery Lyonsouglas Delo, MD 02/29/16 [phone]

## 2016-02-29 NOTE — Discharge Instructions (Signed)
It is suspected that you have a muscle strain. Take it easy, but do not lay around too much as this may make the stiffness worse. Take 500 mg of naproxen every 12 hours or 800 mg of ibuprofen every 8 hours for the next 3 days. Take these medications with food to avoid upset stomach. Robaxin is a muscle relaxer and may help loosen stiff muscles. Do not take the Robaxin while driving or performing other dangerous activities.

## 2016-03-13 ENCOUNTER — Emergency Department (HOSPITAL_COMMUNITY)
Admission: EM | Admit: 2016-03-13 | Discharge: 2016-03-14 | Disposition: A | Discharge status: Home / Self Care | Payer: BLUE CROSS/BLUE SHIELD | Attending: Emergency Medicine | Admitting: Emergency Medicine

## 2016-03-13 ENCOUNTER — Encounter (HOSPITAL_COMMUNITY): Payer: Self-pay | Admitting: Emergency Medicine

## 2016-03-13 DIAGNOSIS — T401X1A Poisoning by heroin, accidental (unintentional), initial encounter: Secondary | ICD-10-CM | POA: Diagnosis present

## 2016-03-13 DIAGNOSIS — Z87891 Personal history of nicotine dependence: Secondary | ICD-10-CM | POA: Insufficient documentation

## 2016-03-13 DIAGNOSIS — Z79899 Other long term (current) drug therapy: Secondary | ICD-10-CM | POA: Insufficient documentation

## 2016-03-13 LAB — COMPREHENSIVE METABOLIC PANEL
ALBUMIN: 3.9 g/dL (ref 3.5–5.0)
ALK PHOS: 41 U/L (ref 38–126)
ALT: 9 U/L — ABNORMAL LOW (ref 14–54)
ANION GAP: 11 (ref 5–15)
AST: 18 U/L (ref 15–41)
BILIRUBIN TOTAL: 0.7 mg/dL (ref 0.3–1.2)
BUN: 12 mg/dL (ref 6–20)
CALCIUM: 9.5 mg/dL (ref 8.9–10.3)
CO2: 22 mmol/L (ref 22–32)
Chloride: 102 mmol/L (ref 101–111)
Creatinine, Ser: 0.65 mg/dL (ref 0.44–1.00)
GFR calc Af Amer: >60 mL/min (ref >60)
GFR calc non Af Amer: >60 mL/min (ref >60)
GLUCOSE: 138 mg/dL — AB (ref 65–99)
POTASSIUM: 3.3 mmol/L — AB (ref 3.5–5.1)
SODIUM: 135 mmol/L (ref 135–145)
TOTAL PROTEIN: 7.5 g/dL (ref 6.5–8.1)

## 2016-03-13 LAB — CBC
HEMATOCRIT: 40.1 % (ref 36.0–46.0)
HEMOGLOBIN: 13.4 g/dL (ref 12.0–15.0)
MCH: 30.8 pg (ref 26.0–34.0)
MCHC: 33.4 g/dL (ref 30.0–36.0)
MCV: 92.2 fL (ref 78.0–100.0)
Platelets: 246 10*3/uL (ref 150–400)
RBC: 4.35 MIL/uL (ref 3.87–5.11)
RDW: 12.7 % (ref 11.5–15.5)
WBC: 7.7 10*3/uL (ref 4.0–10.5)

## 2016-03-13 LAB — CBG MONITORING, ED: Glucose-Capillary: 141 mg/dL — ABNORMAL HIGH (ref 65–99)

## 2016-03-13 LAB — I-STAT BETA HCG BLOOD, ED (MC, WL, AP ONLY)

## 2016-03-13 LAB — SALICYLATE LEVEL: Salicylate Lvl: <4 mg/dL (ref 2.8–30.0)

## 2016-03-13 LAB — ACETAMINOPHEN LEVEL

## 2016-03-13 LAB — ETHANOL: Alcohol, Ethyl (B): <5 mg/dL (ref <5)

## 2016-03-13 MED ORDER — POTASSIUM CHLORIDE CRYS ER 20 MEQ PO TBCR
40.0000 meq | EXTENDED_RELEASE_TABLET | Freq: Once | ORAL | Status: AC
  Administered 2016-03-13: 40 meq via ORAL
  Filled 2016-03-13: qty 2

## 2016-03-13 NOTE — ED Provider Notes (Signed)
[facility] Provider Note   CSN: [id number] Arrival date & time: 03/13/16  2145  By signing my name below, I, Karen Kim, attest that this documentation has been prepared under the direction and in the presence of Karen Booze, MD. Electronically Signed: Angelene Kim, ED Scribe. 03/13/16. 11:22 PM.    History   Chief Complaint Chief Complaint  Patient presents with  . Drug Overdose    HPI Comments: Karen Kim is a 20 y.o. female who presents to the Emergency Department for evaluation s/p an episode of witnessed syncope that lasted for 7 minutes occurring at 9:40 pm tonight. Karen Kim explains that he could not hear pt breathe and even after throwing water on pt's face, she was unable to wake up. Pt explains that she snorted heroin approximately 2 hours prior to onset of her symptoms. She adds that her drug dealer warned her to "be careful because it is strong stuff". No alleviating factors noted. Pt was able to wake up on her own without any medications. She reports that she currently has generalized body aches. She denies any current depression, SI/HI, auditory/visual hallucinations, fevers, chills, abdominal pain, nausea, or any vomiting.   The history is provided by the patient. No language interpreter was used.  Drug Overdose  This is a new problem. The current episode started 1 to 2 hours ago. The problem has been resolved. Pertinent negatives include no abdominal pain. Nothing aggravates the symptoms. Nothing relieves the symptoms. She has tried nothing for the symptoms.    Past Medical History:  Diagnosis Date  . Bipolar affective (HCC)   . Depression   . Gonorrhea   . MRSA (methicillin resistant staph aureus) culture positive   . Scoliosis     Patient Active Problem List   Diagnosis Date Noted  . 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 MDD (major depressive disorder), recurrent severe, without  psychosis (HCC) 01/13/2015  . Suicidal thoughts   . MDD (major depressive disorder), recurrent, severe, with psychosis (HCC) 12/31/2014  . History of bipolar disorder 12/31/2014  . PTSD (post-traumatic stress disorder) 12/31/2014  . Altered mental status 05/23/2014  . Overdose 05/23/2014  . Metabolic encephalopathy 05/23/2014  . Hypotension, unspecified 01/17/2014  . Bradycardia 01/17/2014  . T wave inversion in EKG 01/17/2014  . Drug overdose 01/16/2014  . Gonorrhea     Past Surgical History:  Procedure Laterality Date  . BACK SURGERY  2012   spinal fusion  . BACK SURGERY  2012   MRSA infection- rods removed.  Marland Kitchen BACK SURGERY  2013   Rods put back in    OB History    No data available       Home Medications    Prior to Admission medications   Medication Sig Start Date End Date Taking? Authorizing Provider  azithromycin (ZITHROMAX) 250 MG tablet Take 1 tablet (250 mg total) by mouth daily. Take first 2 tablets together, then 1 every day until finished. Start on 5/25 if symptoms are still not improving by then. Patient not taking: Reported on 12/09/2015 11/24/15   Karen Kim Sam, PA-C  benzonatate (TESSALON) 100 MG capsule Take 1 capsule (100 mg total) by mouth every 8 (eight) hours. Patient not taking: Reported on 12/09/2015 11/21/15   Karen Kim Sam, PA-C  FLUoxetine (PROZAC) 20 MG capsule Take 1 capsule (20 mg total) by mouth daily. For depression Patient not taking: Reported on 12/09/2015 01/18/15   Karen Kava, NP  hydrOXYzine (ATARAX/VISTARIL)  25 MG tablet Take 1 tablet (25 mg total) by mouth every 6 (six) hours as needed for anxiety. Patient not taking: Reported on 12/09/2015 01/18/15   Karen KavaAgnes I Nwoko, NP  ibuprofen (ADVIL,MOTRIN) 800 MG tablet Take 1 tablet (800 mg total) by mouth 3 (three) times daily. Patient not taking: Reported on 12/09/2015 11/21/15   Karen GinsSerena Y Sam, PA-C  lidocaine (LIDODERM) 5 % Place 1 patch onto the skin daily. Remove & Discard patch within 12 hours or as  directed by MD 02/29/16   Karen PancoastShawn C Joy, PA-C  methocarbamol (ROBAXIN) 500 MG tablet Take 1 tablet (500 mg total) by mouth 2 (two) times daily. 02/29/16   Karen C Joy, PA-C  naproxen (NAPROSYN) 500 MG tablet Take 1 tablet (500 mg total) by mouth 2 (two) times daily. 02/29/16   Karen C Joy, PA-C  ondansetron (ZOFRAN ODT) 4 MG disintegrating tablet Take 1 tablet (4 mg total) by mouth every 8 (eight) hours as needed for nausea or vomiting. Patient not taking: Reported on 12/09/2015 11/21/15   Karen GinsSerena Y Sam, PA-C  traZODone (DESYREL) 50 MG tablet Take 1 tablet (50 mg total) by mouth at bedtime. For sleep Patient not taking: Reported on 12/09/2015 01/18/15   Karen KavaAgnes I Nwoko, NP  ziprasidone (GEODON) 20 MG capsule Take 1 capsule (20 mg total) by mouth 2 (two) times daily with a meal. For mood control Patient not taking: Reported on 12/09/2015 01/18/15   Karen KavaAgnes I Nwoko, NP    Family History Family History  Problem Relation Age of Onset  . Suicidality Father     Committed suicide    Social History Social History  Substance Use Topics  . Smoking status: Former Games developermoker  . Smokeless tobacco: Never Used  . Alcohol use Yes     Comment: occasionally     Allergies   Review of patient's allergies indicates no known allergies.   Review of Systems Review of Systems  Constitutional: Negative for chills and fever.  Gastrointestinal: Negative for abdominal pain, nausea and vomiting.  Musculoskeletal: Positive for myalgias.  Neurological: Positive for syncope.  Psychiatric/Behavioral: Negative for dysphoric mood, hallucinations and suicidal ideas.  All other systems reviewed and are negative.    Physical Exam Updated Vital Signs BP 93/62 (BP Location: Left Arm)   Pulse (!) 123   Temp 97.5 F (36.4 C) (Oral)   Resp 25   Ht 5\' 2"  (1.575 m)   Wt 170 lb (77.1 kg)   LMP 01/30/2016 (Within Days)   SpO2 100%   BMI 31.09 kg/m   Physical Exam  Constitutional: She is oriented to person, place, and time. She  appears well-developed and well-nourished.  HENT:  Head: Normocephalic and atraumatic.  Eyes: EOM are normal. Pupils are equal, round, and reactive to light.  Neck: Normal range of motion. Neck supple. No JVD present.  Cardiovascular: Normal rate, regular rhythm and normal heart sounds.   No murmur heard. Pulmonary/Chest: Effort normal and breath sounds normal. She has no wheezes. She has no rales. She exhibits no tenderness.  Abdominal: Soft. Bowel sounds are normal. She exhibits no distension and no mass. There is no tenderness.  Musculoskeletal: Normal range of motion. She exhibits no edema.  Lymphadenopathy:    She has no cervical adenopathy.  Neurological: She is alert and oriented to person, place, and time. No cranial nerve deficit. She exhibits normal muscle tone. Coordination normal.  Skin: Skin is warm and dry. No rash noted.  Psychiatric: She has a normal mood and  affect. Her behavior is normal. Judgment and thought content normal.  Nursing note and vitals reviewed.    ED Treatments / Results  DIAGNOSTIC STUDIES: Oxygen Saturation is 100% on RA, normal by my interpretation.    COORDINATION OF CARE: 11:21 PM- Pt advised of plan for treatment and pt agrees. Pt informed of her lab results and EKG results. She will receive potassium chloride.     Labs (all labs ordered are listed, but only abnormal results are displayed) Labs Reviewed  COMPREHENSIVE METABOLIC PANEL - Abnormal; Notable for the following:       Result Value   Potassium 3.3 (*)    Glucose, Bld 138 (*)    ALT 9 (*)    All other components within normal limits  ACETAMINOPHEN LEVEL - Abnormal; Notable for the following:    Acetaminophen (Tylenol), Serum <10 (*)    All other components within normal limits  CBG MONITORING, ED - Abnormal; Notable for the following:    Glucose-Capillary 141 (*)    All other components within normal limits  ETHANOL  SALICYLATE LEVEL  CBC  URINE RAPID DRUG SCREEN, HOSP  PERFORMED  I-STAT BETA HCG BLOOD, ED ([facility], [facility], [facility] ONLY)   Procedures Procedures (including critical care time)  Medications Ordered in ED Medications  potassium chloride SA (K-DUR,KLOR-CON) CR tablet 40 mEq (40 mEq Oral Given 03/13/16 2341)     Initial Impression / Assessment and Plan / ED Course  Karen Booze, MD has reviewed the triage vital signs and the nursing notes.  Pertinent labs & imaging results that were available during my care of the patient were reviewed by me and considered in my medical decision making (see chart for details).  Clinical Course    Accidental heroin overdose which is clinically resolved. Old records are reviewed and she has prior ED visits for drug abuse and for depression. She denies depression currently denies suicidal ideation. She is observed in the ED for 3 hours with no alteration in level of consciousness and no oxygen desaturation. She is felt to be stable for discharge at this point. Have encouraged her friend to call 911 immediately should there ever be a recurrence. She is given resource guide for drug rehabilitation.  Final Clinical Impressions(s) / ED Diagnoses   Final diagnoses:  Heroin overdose, accidental or unintentional, initial encounter    New Prescriptions Discharge Medication List as of 03/14/2016  2:47 AM     I personally performed the services described in this documentation, which was scribed in my presence. The recorded information has been reviewed and is accurate.       Karen Booze, MD 03/14/16 [phone]

## 2016-03-13 NOTE — ED Triage Notes (Addendum)
Pt arrives via POV with c/o drug overdose, states snorts heroin regularly. States usually gets white or brown, states this time it was red. New dealer told her to "be careful, its strong stuff." Pt remembers waking up with her boyfriend in her face. Reports boyfriend told her he was trying to wake her up for 10 minutes. Anxious in triage. Denies no suicidal/homicidal ideation.

## 2016-03-14 LAB — RAPID URINE DRUG SCREEN, HOSP PERFORMED
AMPHETAMINES: NOT DETECTED
BENZODIAZEPINES: NOT DETECTED
Barbiturates: NOT DETECTED
COCAINE: NOT DETECTED
Opiates: NOT DETECTED
Tetrahydrocannabinol: NOT DETECTED

## 2016-03-14 NOTE — ED Notes (Signed)
Pt dc home with significant other 

## 2017-01-24 ENCOUNTER — Ambulatory Visit (HOSPITAL_COMMUNITY)
Admission: RE | Admit: 2017-01-24 | Discharge: 2017-01-24 | Disposition: A | Discharge status: Home / Self Care | Payer: BLUE CROSS/BLUE SHIELD | Attending: Psychiatry | Admitting: Psychiatry

## 2017-01-24 DIAGNOSIS — F1110 Opioid abuse, uncomplicated: Secondary | ICD-10-CM | POA: Diagnosis not present

## 2017-01-24 DIAGNOSIS — F329 Major depressive disorder, single episode, unspecified: Secondary | ICD-10-CM | POA: Diagnosis not present

## 2017-01-24 NOTE — H&P (Signed)
Behavioral Health Medical Screening Exam  Karen MedinMarissa J Renae Kim is an 21 y.o. female, who [facility] accompanied with her mother, acknowledging Karen BrimMarissa's struggles with both opiate abuse and remote use of Heroin. The patient is seeking detox, but denying nay lethality to include self harm, SI/SA or HI. She does endorse guilt, shame and despair but is denying any pervasive depressive symptoms to include hopelessness or worthlessness. She is endorsing a good support network. She is currently using op ana 5 mg daily.  Total Time spent with patient: 20 minutes  Psychiatric Specialty Exam: Physical Exam  Constitutional: She is oriented to person, place, and time. She appears well-developed and well-nourished. No distress.  HENT:  Head: Normocephalic.  Eyes: Pupils are equal, round, and reactive to light.  Respiratory: Effort normal and breath sounds normal.  Neurological: She is alert and oriented to person, place, and time. No cranial nerve deficit.  Skin: Skin is warm and dry. She is not diaphoretic.  Psychiatric: Her speech is normal and behavior is normal. Thought content normal. Her mood appears anxious. Cognition and memory are normal. She expresses impulsivity. She does not exhibit a depressed mood. She expresses no suicidal plans and no homicidal plans.    Review of Systems  Psychiatric/Behavioral: Positive for substance abuse. Negative for depression, hallucinations and suicidal ideas. The patient is nervous/anxious.   All other systems reviewed and are negative.   There were no vitals taken for this visit.There is no height or weight on file to calculate BMI.  General Appearance: Disheveled  Eye Contact:  Fair  Speech:  Clear and Coherent  Volume:  Normal  Mood:  Anxious  Affect:  Congruent  Thought Process:  Goal Directed  Orientation:  Full (Time, Place, and Person)  Thought Content:  Negative  Suicidal Thoughts:  No  Homicidal Thoughts:  No  Memory:  Immediate;    Fair  Judgement:  Poor  Insight:  Lacking  Psychomotor Activity:  Negative  Concentration: Concentration: Fair  Recall:  [employer]Fair  Fund of Knowledge:Fair  Language: Good  Akathisia:  Negative  Handed:  Right  AIMS (if indicated):     Assets:  Desire for Improvement  Sleep:       Musculoskeletal: Strength & Muscle Tone: within normal limits Gait & Station: normal Patient leans: N/A  There were no vitals taken for this visit.  Recommendations:  Based on my evaluation the patient does not appear to have an emergency medical condition.  Karen HoughSpencer E Sorah Falkenstein, PA-C 01/24/2017, 3:20 AM
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
## 2017-02-19 ENCOUNTER — Encounter (HOSPITAL_COMMUNITY): Payer: Self-pay | Admitting: Emergency Medicine

## 2017-02-19 ENCOUNTER — Emergency Department (HOSPITAL_COMMUNITY)
Admission: EM | Admit: 2017-02-19 | Discharge: 2017-02-19 | Disposition: A | Discharge status: Home / Self Care | Payer: BLUE CROSS/BLUE SHIELD | Attending: Emergency Medicine | Admitting: Emergency Medicine

## 2017-02-19 DIAGNOSIS — L02811 Cutaneous abscess of head [any part, except face]: Secondary | ICD-10-CM | POA: Diagnosis present

## 2017-02-19 DIAGNOSIS — Z87891 Personal history of nicotine dependence: Secondary | ICD-10-CM | POA: Diagnosis not present

## 2017-02-19 DIAGNOSIS — Z8614 Personal history of Methicillin resistant Staphylococcus aureus infection: Secondary | ICD-10-CM | POA: Diagnosis not present

## 2017-02-19 DIAGNOSIS — L03211 Cellulitis of face: Secondary | ICD-10-CM | POA: Diagnosis not present

## 2017-02-19 MED ORDER — IBUPROFEN 400 MG PO TABS: 400.0000 mg | ORAL_TABLET | Freq: Four times a day (QID) | ORAL | 0 refills | Status: AC | PRN

## 2017-02-19 MED ORDER — SULFAMETHOXAZOLE-TRIMETHOPRIM 800-160 MG PO TABS: 1.0000 | ORAL_TABLET | Freq: Two times a day (BID) | ORAL | 0 refills | Status: AC

## 2017-02-19 MED ORDER — IBUPROFEN 400 MG PO TABS
400.0000 mg | ORAL_TABLET | Freq: Once | ORAL | Status: AC
  Administered 2017-02-19: 400 mg via ORAL
  Filled 2017-02-19: qty 1

## 2017-02-19 MED ORDER — LIDOCAINE-EPINEPHRINE (PF) 2 %-1:200000 IJ SOLN
20.0000 mL | Freq: Once | INTRAMUSCULAR | Status: DC
  Filled 2017-02-19: qty 20

## 2017-02-19 NOTE — ED Triage Notes (Signed)
Pt c/o right side facial pain and swelling x 3 days; mild swelling noted around right eye at present

## 2017-02-19 NOTE — Discharge Instructions (Signed)
Today we performed a procedure called incision and drainage (I & D) of your abscess. Abscesses form when an infection in your skin starts to collect bacteria and white blood cells, walling it off from the rest of your body to protect you from a bigger infection. Risk factors for this type of infection include:  ?Break in the skin ?Diabetes ?Swollen areas  Please have your wound looked at in 5 days by a healthcare provider. I included a list of PCPs with this visit.    Some things that may promote healing of your wound and infection include:  ?Keep the infected area clean and dry - You can take a shower or bath, but be sure to pat the area dry with a towel afterward. Do not put any antibiotic ointments or creams on the area. ?Warm compresses 3-4 times per day.  You were prescribed ibuprofen for pain, anti-inflammatory agent (NSAID). You may take 400 mg every 6 hours as needed for pain. If still requiring this medication around the clock for acute pain after 10 days, please see your primary healthcare provider.  You may combine this medication with Tylenol, 650 mg every 6 hours, so you are receiving something for pain every 3 hours.   This is not a long-term medication unless under the care and direction of your primary provider. Taking this medication long-term and not under the supervision of a healthcare provider could increase the risk of stomach ulcers, kidney problems, and cardiovascular problems such as high blood pressure.   You were prescribed antibiotics Bactrim, an antibiotic at this visit.   What do I need to tell my doctor BEFORE I take Bactrim?   For all patients taking this drug:   If you have an allergy to sulfamethoxazole, trimethoprim, or any other part of this drug.   If you are allergic to any drugs like this one, any other drugs, foods, or other substances. Tell your doctor about the allergy and what signs you had, like rash; hives; itching; shortness of breath; wheezing;  cough; swelling of face, lips, tongue, or throat; or any other signs.   If you have anemia caused by a lack of folic acid.   If you have any of these health problems: Kidney disease or liver disease.   If you have any of these health problems: Asthma, porphyria, thyroid disease, not enough folate in the body, poor absorption, or poor nutrition.   If you have been drinking alcohol for a long time or are taking a drug for seizures.   If you have ever had a low platelet count when using trimethoprim or a sulfa (sulfonamide) drug.   If you are taking any of these drugs: Amantadine, cyclosporine, dofetilide, indomethacin, leucovorin, methotrexate, or pyrimethamine.   If you are taking or have recently taken any of these drugs: Benazepril, captopril, enalapril, fosinopril, lisinopril, moexipril, perindopril, quinapril, ramipril, or trandolapril.   If you are taking a water pill.   If you are breast-feeding or plan to breast-feed.   Children:   If your child is younger than 51 months of age. Do not give this drug to an infant younger than 50 months of age.   This is not a list of all drugs or health problems that interact with this drug.   Tell your doctor and pharmacist about all of your drugs (prescription or OTC, natural products, vitamins) and health problems. You must check to make sure that it is safe for you to take this drug with all  of your drugs and health problems. Do not start, stop, or change the dose of any drug without checking with your doctor.   What are some side effects that I need to call my doctor about right away?   WARNING/CAUTION: Even though it may be rare, some people may have very bad and sometimes deadly side effects when taking a drug. Tell your doctor or get medical help right away if you have any of the following signs or symptoms that may be related to a very bad side effect:   All products:   Signs of an allergic reaction, like rash; hives; itching; red, swollen, blistered,  or peeling skin with or without fever; wheezing; tightness in the chest or throat; trouble breathing, swallowing, or talking; unusual hoarseness; or swelling of the mouth, face, lips, tongue, or throat.   Signs of a high potassium level like a heartbeat that does not feel normal; change in thinking clearly and with logic; feeling weak, lightheaded, or dizzy; feel like passing out; numbness or tingling; or shortness of breath.   Signs of low blood sugar like dizziness, headache, feeling sleepy, feeling weak, shaking, a fast heartbeat, confusion, hunger, or sweating.   Signs of kidney problems like unable to pass urine, change in how much urine is passed, blood in the urine, or a big weight gain.   Signs of low sodium levels like headache, trouble focusing, memory problems, feeling confused, weakness, seizures, or change in balance.   Muscle or joint pain.   Purple patches on the skin or mouth.   Shortness of breath.   Hallucinations (seeing or hearing things that are not there).   Mood changes.   It is common to have diarrhea when taking this drug. Rarely, a very bad form of diarrhea called Clostridium difficile (C diff)-associated diarrhea (CDAD) may occur. Sometimes, this has led to a deadly bowel problem (colitis). CDAD may happen while you are taking this drug or within a few months after you stop taking it. Call your doctor right away if you have stomach pain or cramps, very loose or watery stools, or bloody stools. Do not try to treat loose stools without first checking with your doctor.   What are some other side effects of this drug?   All drugs may cause side effects. However, many people have no side effects or only have minor side effects. Call your doctor or get medical help if any of these side effects or any other side effects bother you or do not go away:   Upset stomach or throwing up.   Loose stools (diarrhea).   Not hungry.   These are not all of the side effects that may occur. If  you have questions about side effects, call your doctor. Call your doctor for medical advice about side effects.   You may report side effects to your national health agency.  How is this drug best taken?   Use this drug as ordered by your doctor. Read all information given to you. Follow all instructions closely.   All products:   Drink lots of noncaffeine liquids unless told to drink less liquid by your doctor.   All oral products:   Take with or without food. Take with food if it causes an upset stomach.   Take with a full glass of water.   Take this drug at the same time of day.   To gain the most benefit, do not miss doses.   Keep taking this drug as  you have been told by your doctor or other health care provider, even if you feel well.  What do I do if I miss a dose?   All oral products:   Take a missed dose as soon as you think about it.   If it is close to the time for your next dose, skip the missed dose and go back to your normal time.   Do not take 2 doses at the same time or extra doses.    How do I store and/or throw out this drug?   All oral products:   Store at room temperature.   Store in a dry place. Do not store in a bathroom.   Protect from light.  General drug facts   If your symptoms or health problems do not get better or if they become worse, call your doctor.   Do not share your drugs with others and do not take anyone else's drugs.   Keep a list of all your drugs (prescription, natural products, vitamins, OTC) with you. Give this list to your doctor.   Talk with the doctor before starting any new drug, including prescription or OTC, natural products, or vitamins.   Some drugs may have another patient information leaflet. If you have any questions about this drug, please talk with your doctor, nurse, pharmacist, or other health care provider.   If you think there has been an overdose, call your poison control center or get medical care right away. Be ready to  tell or show what was taken, how much, and when it happened.

## 2017-02-19 NOTE — ED Notes (Signed)
Declined W/C at D/C and was escorted to lobby by RN. 

## 2017-02-19 NOTE — ED Provider Notes (Signed)
[facility]-EMERGENCY DEPT Provider Note   CSN: [id number] Arrival date & time: 02/19/17  1057     History   Chief Complaint Chief Complaint  Patient presents with  . Facial Pain    HPI Karen Kim is a 21 y.o. female.  HPI   Patient is a 21 year old female presenting with a 4 to five-day history of progressively enlarging abscess at the right hairline. Patient noticed within the last 24 hours that the right side of her face started to swell, including the eye. Patient reports the pain across the right side of her face as a 10 out a 10. Patient reports pain around her eye with eye movement but it is not retro-orbital. Patient reports she had a minor abrasion on the right forehead caused by hitting her head while getting out of a car.  Patient notes some changes in her visual field and photophobia related to swelling but no diplopia or blurry vision. Patient reports feeling chilled but has not recorded a fever. Patient reports that she also had a nonproductive cough leading up to this infection and some congestion and rhinorrhea. Patient reports nausea but no vomiting. Patient reports no history of abscesses or cellulitis. Pt has no history of diabetes. Pt is not taking steroids. Pt reports she is not currently using IV drugs.  Past Medical History:  Diagnosis Date  . Bipolar affective (HCC)   . Depression   . Gonorrhea   . MRSA (methicillin resistant staph aureus) culture positive   . Scoliosis     Patient Active Problem List   Diagnosis Date Noted  . 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 MDD (major depressive disorder), recurrent severe, without psychosis (HCC) 01/13/2015  . Suicidal thoughts   . MDD (major depressive disorder), recurrent, severe, with psychosis (HCC) 12/31/2014  . History of bipolar disorder 12/31/2014  . PTSD (post-traumatic stress disorder) 12/31/2014  . Altered mental status 05/23/2014  . Overdose  05/23/2014  . Metabolic encephalopathy 05/23/2014  . Hypotension, unspecified 01/17/2014  . Bradycardia 01/17/2014  . T wave inversion in EKG 01/17/2014  . Drug overdose 01/16/2014  . Gonorrhea     Past Surgical History:  Procedure Laterality Date  . BACK SURGERY  2012   spinal fusion  . BACK SURGERY  2012   MRSA infection- rods removed.  Marland Kitchen BACK SURGERY  2013   Rods put back in    OB History    No data available       Home Medications    Prior to Admission medications   Not on File    Family History Family History  Problem Relation Age of Onset  . Suicidality Father        Committed suicide    Social History Social History  Substance Use Topics  . Smoking status: Former Games developer  . Smokeless tobacco: Never Used  . Alcohol use Yes     Comment: occasionally     Allergies   Patient has no known allergies.   Review of Systems Review of Systems  Constitutional: Positive for chills. Negative for fever.  HENT: Positive for congestion and rhinorrhea.   Eyes: Positive for photophobia, pain and visual disturbance.       Periorbital swelling.  Respiratory: Positive for cough. Negative for chest tightness, shortness of breath and wheezing.   Cardiovascular: Negative for chest pain, palpitations and leg swelling.  Gastrointestinal: Positive for nausea. Negative for abdominal pain, diarrhea and vomiting.  Genitourinary: Negative for dysuria  and flank pain.  Musculoskeletal: Negative for arthralgias, back pain and myalgias.  Skin: Positive for color change. Negative for rash.  Neurological: Negative for light-headedness and headaches.     Physical Exam Updated Vital Signs BP 126/71 (BP Location: Right Arm)   Pulse 76   Temp 98 F (36.7 C) (Oral)   Resp 18   Ht 5\' 2"  (1.575 m)   Wt 77.6 kg (171 lb)   SpO2 100%   BMI 31.28 kg/m   Physical Exam  Constitutional: She appears well-developed and well-nourished. No distress.  HENT:  Head: Normocephalic and  atraumatic.  Mouth/Throat: Oropharynx is clear and moist.  2.5 cm area area of induration with central fluctuance on right side of forehead at hairline.   Right side of face over frontal, temporal, preauricular, mandibular regions slightly swollen and warm to touch. Minimal erythema.  Eyes: Pupils are equal, round, and reactive to light. Conjunctivae and EOM are normal. Right eye exhibits no discharge. Left eye exhibits no discharge.  Patient reports discomfort during EOM exam. Right periorbital swelling but no erythema. No proptosis.  Neck: Normal range of motion. Neck supple.  Enlarged right preauricular lymph node. No other cervical lymphadenopathy.  Cardiovascular: Normal rate, regular rhythm, S1 normal and S2 normal.   No murmur heard. Pulmonary/Chest: Effort normal and breath sounds normal. She has no wheezes. She has no rales.  Abdominal: Soft. She exhibits no distension. There is no tenderness. There is no guarding.  Musculoskeletal: Normal range of motion. She exhibits no edema or deformity.  Neurological: She is alert.  Cranial nerves grossly intact. Normal range of motion and coordination.   Skin: Skin is warm and dry. No rash noted. No erythema.  Psychiatric: She has a normal mood and affect. Her behavior is normal. Judgment and thought content normal.  Nursing note and vitals reviewed.    ED Treatments / Results  Labs (all labs ordered are listed, but only abnormal results are displayed) Labs Reviewed - No data to display  EKG  EKG Interpretation None       Radiology No results found.  Procedures .Marland KitchenIncision and Drainage Date/Time: 02/19/2017 3:30 PM Performed by: Elisha Ponder Authorized by: Aviva Kluver B   Consent:    Consent obtained:  Verbal   Consent given by:  Patient   Risks discussed:  Pain, incomplete drainage and infection   Alternatives discussed:  Observation Location:    Type:  Abscess   Location:  Head   Head location:  Scalp  (Overlying the right hairline.) Pre-procedure details:    Procedure prep: Alchohol wipe. Anesthesia (see MAR for exact dosages):    Anesthesia method:  Local infiltration   Local anesthetic:  Lidocaine 2% WITH epi Procedure type:    Complexity:  Simple Procedure details:    Needle aspiration: no     Incision type: No incision made. Abscess was already draining. All edges of cavity marsupialized.   Incision depth:  Dermal   Scalpel size: No scalpel used.   Drainage:  Purulent   Drainage amount:  Moderate   Wound treatment:  Wound left open   Packing materials:  None (Shallow wound, none indicated.) Post-procedure details:    Patient tolerance of procedure:  Tolerated well, no immediate complications Comments:     Supervised by Burna Forts, PA-C.   (including critical care time)  Medications Ordered in ED Medications  ibuprofen (ADVIL,MOTRIN) tablet 400 mg (not administered)     Initial Impression / Assessment and Plan / ED Course  I have reviewed the triage vital signs and the nursing notes.  Pertinent labs & imaging results that were available during my care of the patient were reviewed by me and considered in my medical decision making (see chart for details).    1430. Patient seen and examined. Ibuprofen 400 mg ordered for pain and inflammation. Discussed the procedure of I&D with patient in detail and patient verbally consented to proceed.  1530. Proceeded with I & D. Patient tolerated procedure well and reported some immediate relief post I & D procedure.  Final Clinical Impressions(s) / ED Diagnoses   Final diagnoses:  None   MDM  Patient is a 21 year old female presenting with a 4 to 5-day history of progressively enlarging abscess at the right hairline and 24 hour history of right-sided facial pain and swelling. Differential diagnosis includes abscess with concurrent cellulitis, preseptal cellulitis, septal cellulitis, erysipelas. Patient is afebrile, nontoxic  appearing and shows no evidence of systemic infection. Patient's presentation is consistent with a cutaneous abscess with some mild surrounding cellulitis. Patient does not exhibit proptosis and upon clarification, patient's facial pain with eye movement is not retro-orbital and there is a low suspicion for orbital cellulitis. Patient's presentation warrants treatment with antibiotics and patient was discharged with a prescription for Bactrim.   Patient given explicit post I&D care instructions including warm compresses and dressing changes. Patient does not have a primary care provider but she was given a list of possible primary care providers as well as instructions that she could always return to the ED or urgent care in 5-7 days for wound recheck. Patient given return precautions for worsening infection, pain, spreading infection, or extension into the orbit. Patient is in understanding of the plan of care.  New Prescriptions New Prescriptions   No medications on file     Delia Chimes 02/19/17 [address] PA-C 02/19/17 2232    Geoffery Lyons, MD 02/20/17 [phone]

## 2017-06-16 IMAGING — CR DG CHEST 2V
2 series · 2 of 2 positions shown · non-contrast
Comparison: Chest x-ray dated 05/22/2014.

CLINICAL DATA: Three weeks of cough, chest pressure and vomiting.

EXAM:
CHEST  2 VIEW

[chest pa]
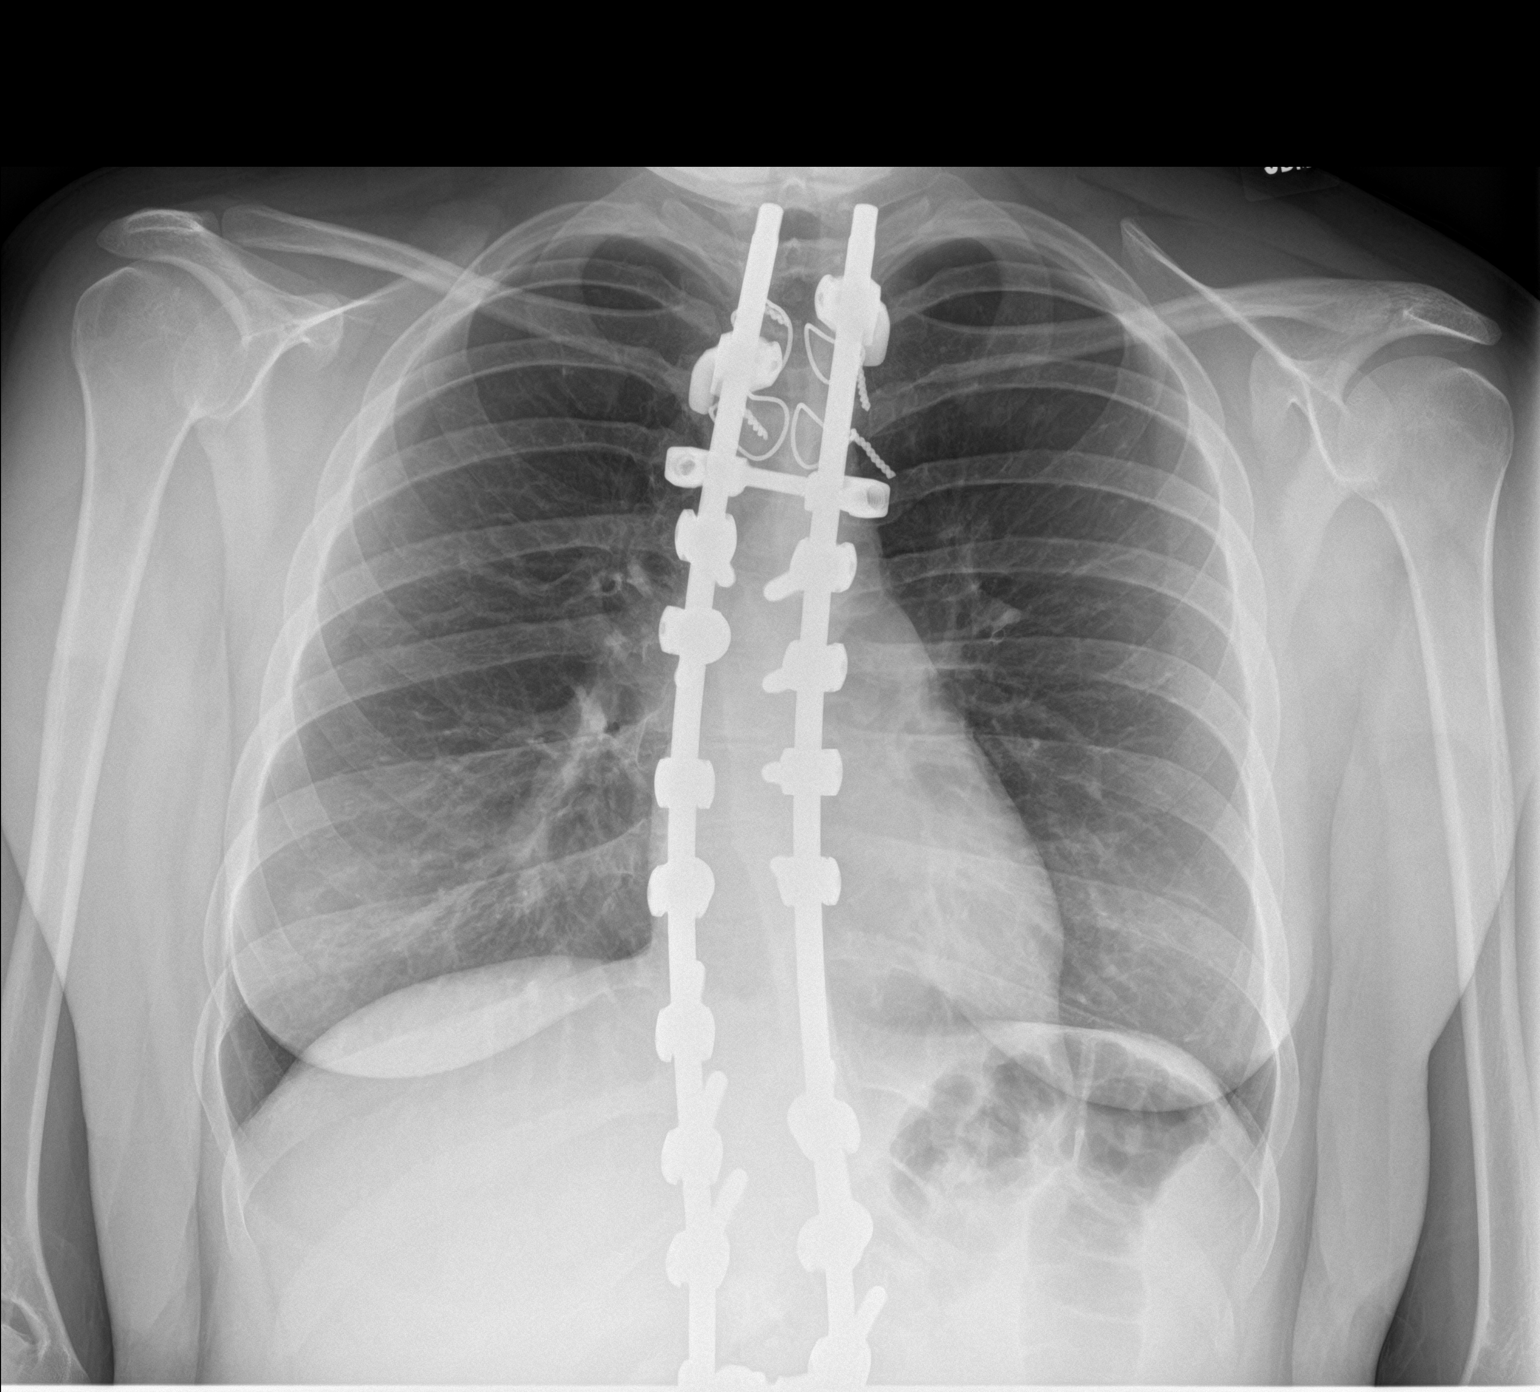

[chest lat]
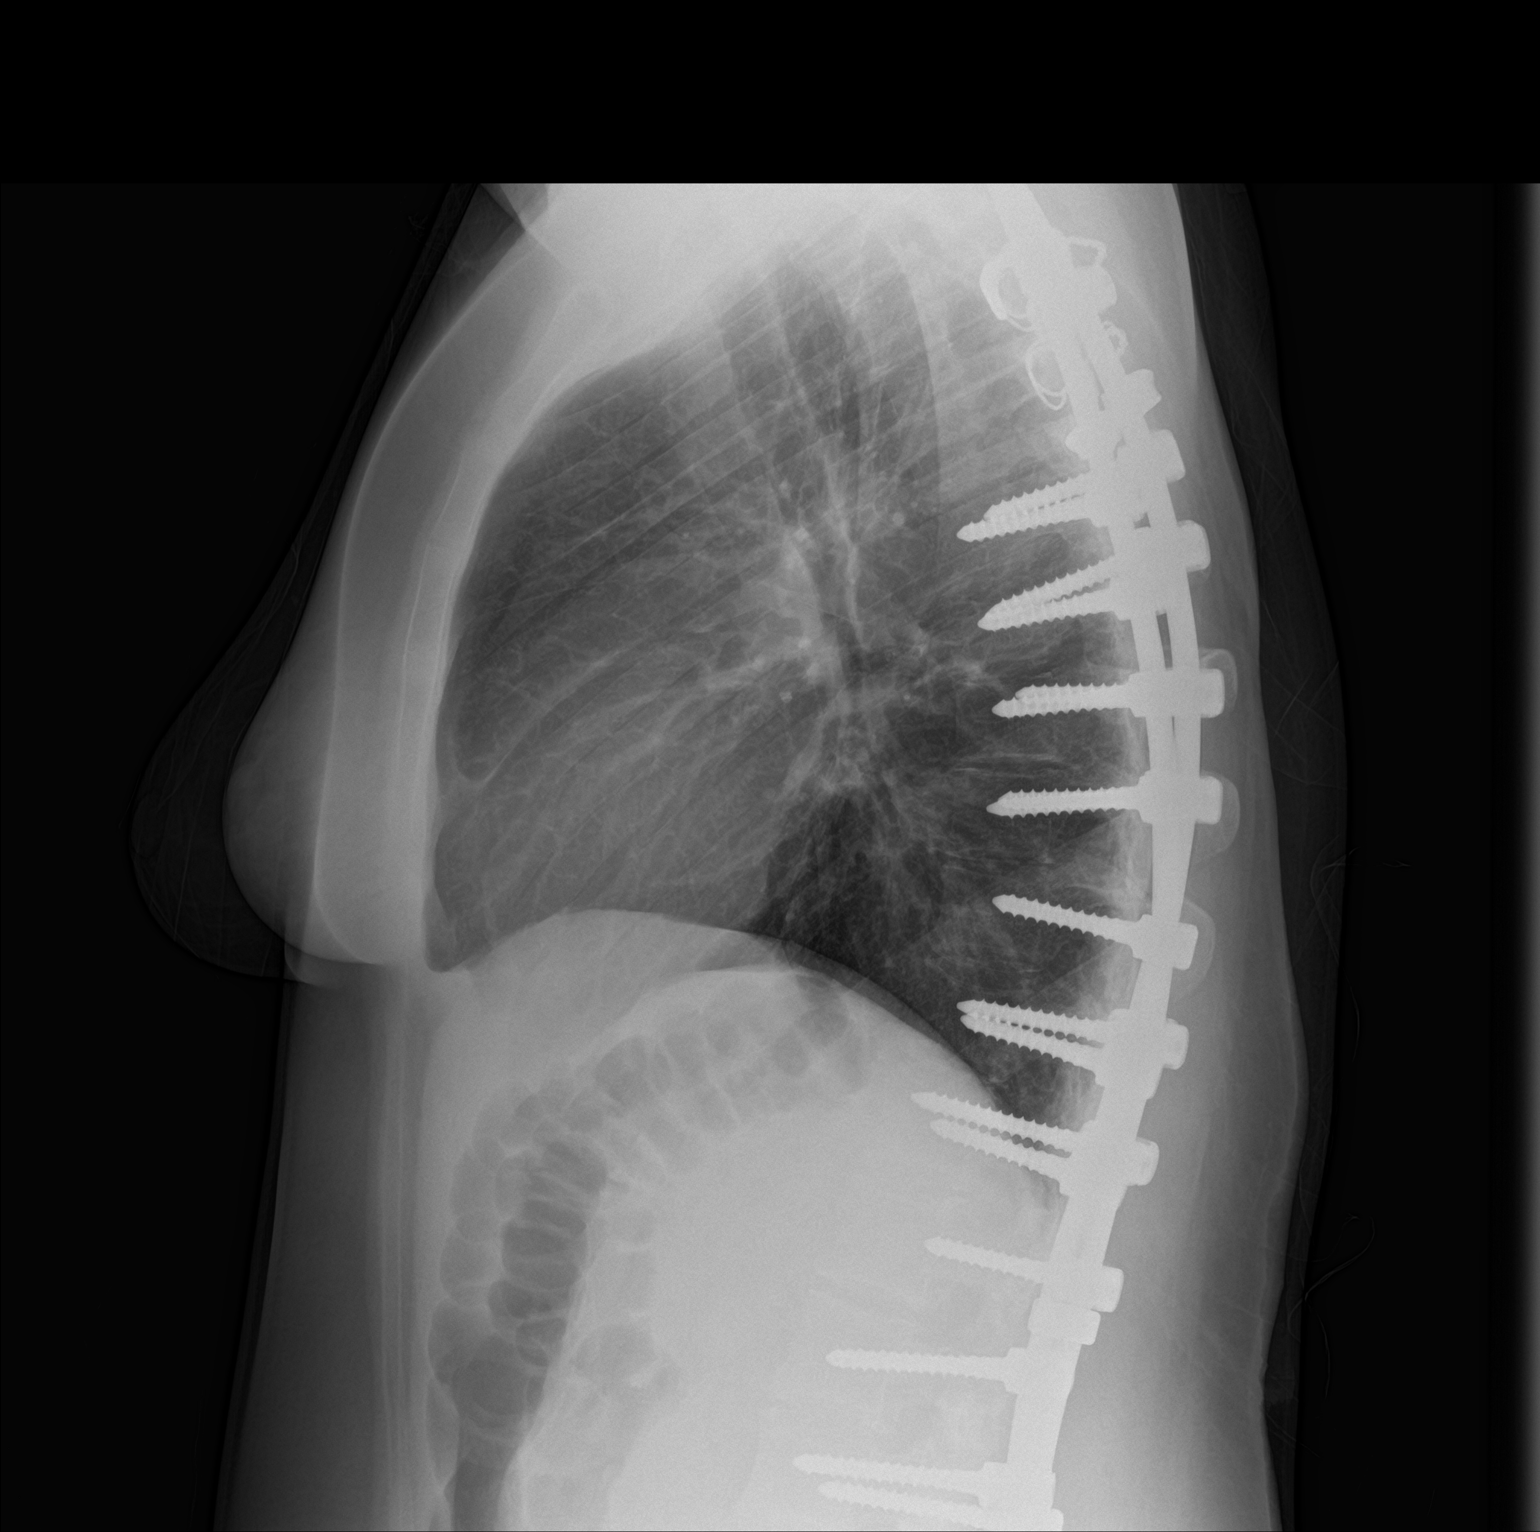

[2 of 2 positions shown; findings below may reference images not displayed]

FINDINGS: Heart size is normal. Lungs are clear. No evidence of pneumonia. No
pleural effusion or pneumothorax seen.

Extensive spinal fusion hardware for the thoracolumbar scoliosis
appears intact and stable in alignment. No acute -appearing osseous
abnormality.
IMPRESSION: Lungs are clear and there is no evidence of acute cardiopulmonary
abnormality.

## 2017-09-09 ENCOUNTER — Ambulatory Visit: Payer: Self-pay | Admitting: Family Medicine

## 2017-12-29 ENCOUNTER — Inpatient Hospital Stay (HOSPITAL_COMMUNITY)
Admission: AD | Admit: 2017-12-29 | Discharge: 2017-12-30 | Disposition: A | Discharge status: Home / Self Care | Payer: Self-pay | Source: Ambulatory Visit | Attending: Obstetrics & Gynecology | Admitting: Obstetrics & Gynecology

## 2017-12-29 DIAGNOSIS — N760 Acute vaginitis: Secondary | ICD-10-CM | POA: Insufficient documentation

## 2017-12-29 DIAGNOSIS — N750 Cyst of Bartholin's gland: Secondary | ICD-10-CM

## 2017-12-29 DIAGNOSIS — D649 Anemia, unspecified: Secondary | ICD-10-CM | POA: Insufficient documentation

## 2017-12-29 DIAGNOSIS — F319 Bipolar disorder, unspecified: Secondary | ICD-10-CM | POA: Insufficient documentation

## 2017-12-29 DIAGNOSIS — F17210 Nicotine dependence, cigarettes, uncomplicated: Secondary | ICD-10-CM | POA: Insufficient documentation

## 2017-12-30 ENCOUNTER — Encounter (HOSPITAL_COMMUNITY): Payer: Self-pay

## 2017-12-30 ENCOUNTER — Other Ambulatory Visit: Payer: Self-pay

## 2017-12-30 DIAGNOSIS — N750 Cyst of Bartholin's gland: Secondary | ICD-10-CM

## 2017-12-30 LAB — URINALYSIS, ROUTINE W REFLEX MICROSCOPIC
BILIRUBIN URINE: NEGATIVE
Glucose, UA: NEGATIVE mg/dL
HGB URINE DIPSTICK: NEGATIVE
Ketones, ur: NEGATIVE mg/dL
Nitrite: NEGATIVE
Protein, ur: NEGATIVE mg/dL
SPECIFIC GRAVITY, URINE: 1.024 (ref 1.005–1.030)
pH: 6 (ref 5.0–8.0)

## 2017-12-30 LAB — WET PREP, GENITAL
Sperm: NONE SEEN
Trich, Wet Prep: NONE SEEN
YEAST WET PREP: NONE SEEN

## 2017-12-30 LAB — RAPID URINE DRUG SCREEN, HOSP PERFORMED
Amphetamines: NOT DETECTED
Benzodiazepines: NOT DETECTED
Cocaine: NOT DETECTED
OPIATES: POSITIVE — AB
TETRAHYDROCANNABINOL: NOT DETECTED

## 2017-12-30 LAB — POCT PREGNANCY, URINE: Preg Test, Ur: NEGATIVE

## 2017-12-30 MED ORDER — METRONIDAZOLE 500 MG PO TABS: 500.0000 mg | ORAL_TABLET | Freq: Two times a day (BID) | ORAL | 0 refills | Status: DC

## 2017-12-30 MED ORDER — LIDOCAINE HCL (PF) 1 % IJ SOLN
INTRAMUSCULAR | Status: AC
  Filled 2017-12-30: qty 30

## 2017-12-30 MED ORDER — PROBIOTIC 250 MG PO CAPS: 1.0000 | ORAL_CAPSULE | Freq: Every morning | ORAL | 3 refills | Status: DC

## 2017-12-30 NOTE — Discharge Instructions (Signed)
Bartholin Cyst or Abscess A Bartholin cyst is a fluid-filled sac that forms on a Bartholin gland. Bartholin glands are small glands that are located within the folds of skin (labia) along the sides of the lower opening of the vagina. These glands produce a fluid to moisten the outside of the vagina during sexual intercourse. A Bartholin cyst causes a bulge on the side of the vagina. A cyst that is not large or infected may not cause symptoms or problems. However, if the fluid within the cyst becomes infected, the cyst can turn into an abscess. An abscess may cause discomfort or pain. What are the causes? A Bartholin cyst may develop when the duct of the gland becomes blocked. In many cases, the cause of this is not known. Various kinds of bacteria can cause the cyst to become infected and develop into an abscess. What increases the risk? You may be at an increased risk of developing a Bartholin cyst or abscess if:  You are a woman of reproductive age.  You have a history of previous Bartholin cysts or abscesses.  You have diabetes.  You have a sexually transmitted disease (STD).  What are the signs or symptoms? The severity of symptoms varies depending on the size of the cyst and whether it is infected. Symptoms may include:  A bulge or swelling near the lower opening of your vagina.  Discomfort or pain.  Redness.  Pain during sexual intercourse.  Pain when walking.  Fluid draining from the area.  How is this diagnosed? Your health care provider may make a diagnosis based on your symptoms and a physical exam. He or she will look for swelling in your vaginal area. Blood tests may be done to check for infections. A sample of fluid from the cyst or abscess may also be taken to be tested in a lab. How is this treated? Small cysts that are not infected may not require any treatment. These often go away on their own. Yourhealth care provider will recommend hot baths and the use of warm  compresses. These may also be part of the treatment for an abscess. Treatment options for a large cyst or abscess may include:  Antibiotic medicine.  A surgical procedure to drain the abscess. One of the following procedures may be done: ? Incision and drainage. An incision is made in the cyst or abscess so that the fluid drains out. A catheter may be placed inside the cyst so that it does not close and fill up with fluid again. The catheter will be removed after you have a follow-up visit with a specialist (gynecologist). ? Marsupialization. The cyst or abscess is opened and kept open by stitching the edges of the skin to the walls of the cyst or abscess. This allows it to continue to drain and not fill up with fluid again.  If you have cysts or abscesses that keep returning and have required incision and drainage multiple times, your health care provider may talk to you about surgery to remove the Bartholin gland. Follow these instructions at home:  Take medicines only as directed by your health care provider.  If you were prescribed an antibiotic medicine, finish it all even if you start to feel better.  Apply warm, wet compresses to the area or take warm, shallow baths that cover your pelvic region (sitz baths) several times a day or as directed by your health care provider.  Do not squeeze the cyst or apply heavy pressure to it.    Do not have sexual intercourse until the cyst has gone away.  If your cyst or abscess was opened, a small piece of gauze or a drain may have been placed in the area to allow drainage. Do not remove the gauze or the drain until directed by your health care provider.  Wear feminine pads-not tampons-as needed for any drainage or bleeding.  Keep all follow-up visits as directed by your health care provider. This is important. How is this prevented? Take these steps to help prevent a Bartholin cyst from returning:  Practice good hygiene.  Clean your vaginal  area with mild soap and a soft cloth when you bathe.  Practice safe sex to prevent STDs.  Contact a health care provider if:  You have increased pain, swelling, or redness in the area of the cyst.  Puslike drainage is coming from the cyst.  You have a fever. This information is not intended to replace advice given to you by your health care provider. Make sure you discuss any questions you have with your health care provider. Document Released: 06/18/2005 Document Revised: 11/24/2015 Document Reviewed: 02/01/2014 Elsevier Interactive Patient Education  2018 [employer]  

## 2017-12-30 NOTE — MAU Note (Signed)
Pt. States she noticed "ball" between legs on vagina. States it has gotten bigger over a week. Wants to be sure it is not a tumor. Denies vaginal bleeding. States yellow thick vaginal discharge with foul odor. States last intercourse within the last month.

## 2017-12-30 NOTE — MAU Note (Signed)
Pt. Also reports nausea.

## 2017-12-30 NOTE — MAU Provider Note (Signed)
History     CSN: [id number]  Arrival date and time: 12/29/17 2341   First Provider Initiated Contact with Patient 12/30/17 0021      Chief Complaint  Patient presents with  . Vaginal Pain   HPI  Karen Kim is a 22 y.o. non pregnant patient who presents to MAU with chief complaint of a hard growth on the left side of her vagina. This is a new problem, onset roughly seven days ago. Patient states she was riding on back of boyfriend's motorcycle earlier today and couldn't maintain a seated position due to pain 10/10. Denies other pain sites, aggravating or alleviating factors.   Pertinent Gynecological History: Menses: flow is moderate Bleeding: regular Contraception: condoms DES exposure: denies Blood transfusions: none Sexually transmitted diseases: currently at risk Previous GYN Procedures: N/A  No PAP history  Past Medical History:  Diagnosis Date  . Bipolar affective (HCC)   . Depression   . Gonorrhea   . MRSA (methicillin resistant staph aureus) culture positive   . Scoliosis     Past Surgical History:  Procedure Laterality Date  . BACK SURGERY  2012   spinal fusion  . BACK SURGERY  2012   MRSA infection- rods removed.  Marland Kitchen. BACK SURGERY  2013   Rods put back in    Family History  Problem Relation Age of Onset  . Suicidality Father        Committed suicide    Social History   Tobacco Use  . Smoking status: Current Every Day Smoker    Packs/day: 0.50    Types: Cigarettes  . Smokeless tobacco: Never Used  Substance Use Topics  . Alcohol use: Not Currently    Comment: months  . 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 buprenorphine-naloxone (SUBOXONE) 8-2 mg SUBL SL tablet Place 2 tablets under the tongue daily.   12/30/2017 at Unknown time    Review of Systems  Genitourinary: Positive for  vaginal discharge and vaginal pain.       Left labia "like something is hanging out of my vagina"  All other systems reviewed and are negative.  Physical Exam   Blood pressure 117/65, pulse 75, temperature 99.2 F (37.3 C), temperature source Oral, resp. rate 18, height 5\' 3"  (1.6 m), weight 150 lb 8 oz (68.3 kg), last menstrual period 12/22/2017, SpO2 97 %.  Physical Exam  Nursing note and vitals reviewed. Constitutional: She is oriented to person, place, and time. She appears well-developed and well-nourished.  Cardiovascular: Normal rate, regular rhythm, normal heart sounds and intact distal pulses.  Respiratory: Effort normal and breath sounds normal.  GI: Soft. Bowel sounds are normal.  Genitourinary: Uterus normal.  Genitourinary Comments: Enlarged bartholin gland with palpable cyst displacing left labia  Musculoskeletal: Normal range of motion.  Neurological: She is alert and oriented to person, place, and time. She has normal reflexes.  Skin: Skin is warm, dry and intact. Ecchymosis noted.  Psychiatric: She has a normal mood and affect. Her behavior is normal. Judgment and thought content normal.    MAU Course  Procedures  MDM Bartholin Cyst I&D and Word Catheter Placement Enlarged abscess palpated in front of the hymenal ring around 5 o' clock.  Written informed consent was obtained.  Discussed complications and possible outcomes of procedure including recurrence of  cyst, scarring leading to infection, bleeding, dyspareunia, distortion of anatomy.  Patient was examined in the dorsal lithotomy position and mass was identified.  The area was prepped with Iodine and draped in a sterile manner. 1% Lidocaine (3 ml) was then used to infiltrate area on top of the cyst, behind the hymenal ring.  A 7 mm incision was made using a sterile scapel. Upon palpation of the mass, a moderate amount of bloody purulent drainage was expressed through the incision. A hemostat was used to break up  loculations, which resulted in expression of more bloody purulent drainage.  Samples of the drainage were sent for cultures. The open cyst was then copiously irrigated with normal saline, and a Word catheter was placed. 1.5 ml of sterile water was used to inflate the catheter balloon.  The end of the catheter was tucked into the vagina.  Patient tolerated the procedure well, reported feeling " a lot better." - Recommended Sitz baths bid and Motrin prn for pain.   She was told to call to be examined if she experiences increasing swelling, pain, vaginal discharge, or fever.  - She was instructed to wear a peripad to absorb discharge, and to maintain pelvic rest while the Word catheter is in place.  -The catheter will be left in place for at least two to four weeks to promote formation of an epithelialized tract for permanent drainage of glandular secretions.  - She will need an appointment in GYN Clinician 4-6 weeks from now for removal of word catheter.   Assessment and Plan  --s/p I&D of Bartholin Cyst without complication --Anemic per patient report, start daily multivitamin --Patient to schedule followup in 3-4 weeks, plans to use MAU --Bacterial Vaginosis, Rx sent to pharmacy on record. Discussed daily probiotic to minimize BV  Meds ordered this encounter  Medications  . lidocaine (PF) (XYLOCAINE) 1 % injection    Mosca, Bridget   : cabinet override  . metroNIDAZOLE (FLAGYL) 500 MG tablet    Sig: Take 1 tablet (500 mg total) by mouth 2 (two) times daily.    Dispense:  14 tablet    Refill:  0    Order Specific Question:   Supervising Provider    Answer:   Reva Bores [[id number]]  . Saccharomyces boulardii (PROBIOTIC) 250 MG CAPS    Sig: Take 1 tablet by mouth every morning.    Dispense:  30 capsule    Refill:  3    Order Specific Question:   Supervising Provider    Answer:   Samara Snide    --Discharge home in stable condition  Calvert Cantor, CNM 12/30/2017, 1:31 AM

## 2017-12-31 LAB — GC/CHLAMYDIA PROBE AMP (~~LOC~~) NOT AT ARMC
CHLAMYDIA, DNA PROBE: NEGATIVE
NEISSERIA GONORRHEA: NEGATIVE

## 2018-02-06 ENCOUNTER — Inpatient Hospital Stay (HOSPITAL_COMMUNITY)
Admission: AD | Admit: 2018-02-06 | Discharge: 2018-02-07 | Disposition: A | Discharge status: Home / Self Care | Payer: Self-pay | Source: Ambulatory Visit | Attending: Obstetrics and Gynecology | Admitting: Obstetrics and Gynecology

## 2018-02-06 DIAGNOSIS — N751 Abscess of Bartholin's gland: Secondary | ICD-10-CM

## 2018-02-06 HISTORY — DX: Other psychoactive substance abuse, uncomplicated: F19.10

## 2018-02-06 NOTE — MAU Note (Signed)
Pt here for removal of ward catheter that was placed for Bartholin's Cyst 3 weeks ago. Pt reports improvement in bartholin's cyst. Pt denies pain.

## 2018-02-07 ENCOUNTER — Encounter (HOSPITAL_COMMUNITY): Payer: Self-pay | Admitting: *Deleted

## 2018-02-07 DIAGNOSIS — N751 Abscess of Bartholin's gland: Secondary | ICD-10-CM

## 2018-02-07 NOTE — Discharge Instructions (Signed)
Bartholin Cyst or Abscess A Bartholin cyst is a fluid-filled sac that forms on a Bartholin gland. Bartholin glands are small glands that are located within the folds of skin (labia) along the sides of the lower opening of the vagina. These glands produce a fluid to moisten the outside of the vagina during sexual intercourse. A Bartholin cyst causes a bulge on the side of the vagina. A cyst that is not large or infected may not cause symptoms or problems. However, if the fluid within the cyst becomes infected, the cyst can turn into an abscess. An abscess may cause discomfort or pain. What are the causes? A Bartholin cyst may develop when the duct of the gland becomes blocked. In many cases, the cause of this is not known. Various kinds of bacteria can cause the cyst to become infected and develop into an abscess. What increases the risk? You may be at an increased risk of developing a Bartholin cyst or abscess if:  You are a woman of reproductive age.  You have a history of previous Bartholin cysts or abscesses.  You have diabetes.  You have a sexually transmitted disease (STD).  What are the signs or symptoms? The severity of symptoms varies depending on the size of the cyst and whether it is infected. Symptoms may include:  A bulge or swelling near the lower opening of your vagina.  Discomfort or pain.  Redness.  Pain during sexual intercourse.  Pain when walking.  Fluid draining from the area.  How is this diagnosed? Your health care provider may make a diagnosis based on your symptoms and a physical exam. He or she will look for swelling in your vaginal area. Blood tests may be done to check for infections. A sample of fluid from the cyst or abscess may also be taken to be tested in a lab. How is this treated? Small cysts that are not infected may not require any treatment. These often go away on their own. Yourhealth care provider will recommend hot baths and the use of warm  compresses. These may also be part of the treatment for an abscess. Treatment options for a large cyst or abscess may include:  Antibiotic medicine.  A surgical procedure to drain the abscess. One of the following procedures may be done: ? Incision and drainage. An incision is made in the cyst or abscess so that the fluid drains out. A catheter may be placed inside the cyst so that it does not close and fill up with fluid again. The catheter will be removed after you have a follow-up visit with a specialist (gynecologist). ? Marsupialization. The cyst or abscess is opened and kept open by stitching the edges of the skin to the walls of the cyst or abscess. This allows it to continue to drain and not fill up with fluid again.  If you have cysts or abscesses that keep returning and have required incision and drainage multiple times, your health care provider may talk to you about surgery to remove the Bartholin gland. Follow these instructions at home:  Take medicines only as directed by your health care provider.  If you were prescribed an antibiotic medicine, finish it all even if you start to feel better.  Apply warm, wet compresses to the area or take warm, shallow baths that cover your pelvic region (sitz baths) several times a day or as directed by your health care provider.  Do not squeeze the cyst or apply heavy pressure to it.    Do not have sexual intercourse until the cyst has gone away.  If your cyst or abscess was opened, a small piece of gauze or a drain may have been placed in the area to allow drainage. Do not remove the gauze or the drain until directed by your health care provider.  Wear feminine pads-not tampons-as needed for any drainage or bleeding.  Keep all follow-up visits as directed by your health care provider. This is important. How is this prevented? Take these steps to help prevent a Bartholin cyst from returning:  Practice good hygiene.  Clean your vaginal  area with mild soap and a soft cloth when you bathe.  Practice safe sex to prevent STDs.  Contact a health care provider if:  You have increased pain, swelling, or redness in the area of the cyst.  Puslike drainage is coming from the cyst.  You have a fever. This information is not intended to replace advice given to you by your health care provider. Make sure you discuss any questions you have with your health care provider. Document Released: 06/18/2005 Document Revised: 11/24/2015 Document Reviewed: 02/01/2014 Elsevier Interactive Patient Education  2018 [employer]  

## 2018-02-07 NOTE — MAU Provider Note (Signed)
Chief Complaint:  Bartholin's Cyst   First Provider Initiated Contact with Patient 02/07/18 0005     HPI: Karen Kim is a 22 y.o. G1P0010 who presents to maternity admissions reporting need to have Word catheter removed.  States was told to come here to have it done. .Had Incision and Drainage of left Bartholins abscess done on 12/30/17 with placement of Word Catheter.  Has tolerated it well and swelling went down nicely She reports no vaginal bleeding, vaginal itching/burning, urinary symptoms, h/a, dizziness, n/v, or fever/chills.    HPI RN Note: Pt here for removal of ward catheter that was placed for Bartholin's Cyst 3 weeks ago. Pt reports improvement in bartholin's cyst. Pt denies pain  Past Medical History: Past Medical History:  Diagnosis Date  . Bipolar affective (HCC)   . Depression   . Gonorrhea   . MRSA (methicillin resistant staph aureus) culture positive   . Scoliosis   . Substance abuse (HCC)     Past obstetric history: OB History  Gravida Para Term Preterm AB Living  1       1 0  SAB TAB Ectopic Multiple Live Births  1            # Outcome Date GA Lbr Len/2nd Weight Sex Delivery Anes PTL Lv  1 SAB             Past Surgical History: Past Surgical History:  Procedure Laterality Date  . BACK SURGERY  2012   spinal fusion  . BACK SURGERY  2012   MRSA infection- rods removed.  Marland Kitchen BACK SURGERY  2013   Rods put back in  . WISDOM TOOTH EXTRACTION      Family History: Family History  Problem Relation Age of Onset  . Suicidality Father        Committed suicide    Social History: Social History   Tobacco Use  . Smoking status: Current Every Day Smoker    Packs/day: 0.50    Types: Cigarettes  . Smokeless tobacco: Never Used  Substance Use Topics  . Alcohol use: Not Currently    Comment: months  . Drug use: Yes    Types: Heroin    Comment: last heroin use march 2019    Allergies: No Known Allergies  Meds:  Medications Prior to Admission   Medication Sig Dispense Refill Last Dose  . buprenorphine-naloxone (SUBOXONE) 8-2 mg SUBL SL tablet Place 2 tablets under the tongue daily.   Past Week at Unknown time  . metroNIDAZOLE (FLAGYL) 500 MG tablet Take 1 tablet (500 mg total) by mouth 2 (two) times daily. 14 tablet 0 More than a month at Unknown time  . Saccharomyces boulardii (PROBIOTIC) 250 MG CAPS Take 1 tablet by mouth every morning. 30 capsule 3     I have reviewed patient's Past Medical Hx, Surgical Hx, Family Hx, Social Hx, medications and allergies.  ROS:  Review of Systems  Constitutional: Negative for chills and fever.  Respiratory: Negative for shortness of breath.   Gastrointestinal: Negative for abdominal pain, constipation, diarrhea, nausea and vomiting.  Genitourinary: Positive for vaginal discharge. Negative for dysuria, pelvic pain and vaginal bleeding.   Other systems negative  Physical Exam   Patient Vitals for the past 24 hrs:  BP Temp Temp src Pulse Resp SpO2 Height Weight  02/06/18 2311 110/63 98.1 F (36.7 C) Oral 61 16 100 % 5\' 3"  (1.6 m) 65.8 kg   Constitutional: Well-developed, well-nourished female in no acute distress.  Cardiovascular:  normal rate and rhythm Respiratory: normal effort, no distress. GI: Abd soft, non-tender.  Nondistended.  No rebound, No guarding.    MS: Extremities nontender, no edema, normal ROM Neurologic: Alert and oriented x 4.   Grossly nonfocal. GU: Neg CVAT. Skin:  Warm and Dry Psych:  Affect appropriate.  PELVIC EXAM:  EGBUS normal except for presence of a Word catheter in left Bartholins gland                            There is milky discharge. No erethema or induration noted.    Labs: No results found for this or any previous visit (from the past 24 hour(s)).    Imaging:  No results found.  MAU Course/MDM: I have ordered labs as follows:none Imaging ordered: none.   Treatments in MAU included Balloon deflated and Word catheter removed atraumatically.   Tolerated well.   Pt stable at time of discharge.  Assessment: Abscess of Bartholin's gland - Plan: Discharge patient  Plan: Discharge home Recommend Keep area clean.  If abscess recurs, call clinic for evaluation Discussed if becomes recurrent may need marsupialization. But since she left the catheter In for so long, it is likely epithelialized well and will probably stay open  Encouraged to return here or to other Urgent Care/ED if she develops worsening of symptoms, increase in pain, fever, or other concerning symptoms.   Wynelle BourgeoisMarie Williams CNM, MSN Certified Nurse-Midwife 02/07/2018 12:19 AM

## 2018-06-29 ENCOUNTER — Emergency Department (HOSPITAL_COMMUNITY)
Admission: EM | Admit: 2018-06-29 | Discharge: 2018-06-29 | Disposition: A | Discharge status: Home / Self Care | Payer: Self-pay | Attending: Emergency Medicine | Admitting: Emergency Medicine

## 2018-06-29 ENCOUNTER — Other Ambulatory Visit: Payer: Self-pay

## 2018-06-29 DIAGNOSIS — Z79899 Other long term (current) drug therapy: Secondary | ICD-10-CM | POA: Insufficient documentation

## 2018-06-29 DIAGNOSIS — L03213 Periorbital cellulitis: Secondary | ICD-10-CM | POA: Insufficient documentation

## 2018-06-29 DIAGNOSIS — F17210 Nicotine dependence, cigarettes, uncomplicated: Secondary | ICD-10-CM | POA: Insufficient documentation

## 2018-06-29 MED ORDER — AMOXICILLIN-POT CLAVULANATE 875-125 MG PO TABS: 1.0000 | ORAL_TABLET | Freq: Two times a day (BID) | ORAL | 0 refills | Status: DC

## 2018-06-29 MED ORDER — FLUORESCEIN SODIUM 1 MG OP STRP: 1.0000 | ORAL_STRIP | Freq: Once | OPHTHALMIC | Status: DC

## 2018-06-29 MED ORDER — ERYTHROMYCIN 5 MG/GM OP OINT
1.0000 "application " | TOPICAL_OINTMENT | Freq: Once | OPHTHALMIC | Status: AC
  Administered 2018-06-29: 1 via OPHTHALMIC
  Filled 2018-06-29: qty 3.5

## 2018-06-29 MED ORDER — TETRACAINE HCL 0.5 % OP SOLN: 2.0000 [drp] | Freq: Once | OPHTHALMIC | Status: DC

## 2018-06-29 MED ORDER — ERYTHROMYCIN 5 MG/GM OP OINT: TOPICAL_OINTMENT | OPHTHALMIC | 0 refills | Status: DC

## 2018-06-29 MED ORDER — CEFTRIAXONE SODIUM 1 G IJ SOLR
1.0000 g | Freq: Once | INTRAMUSCULAR | Status: AC
  Administered 2018-06-29: 1 g via INTRAMUSCULAR
  Filled 2018-06-29: qty 10

## 2018-06-29 NOTE — ED Provider Notes (Signed)
Emergency Department Provider Note   I have reviewed the triage vital signs and the nursing notes.   HISTORY  Chief Complaint Eye Problem (left eye)   HPI Karen Kim is a 22 y.o. female with medical problems documented below the presents the emergency department today with 2 to 3 days of left eye swelling.  Its somewhat itchy, red and mildly painful.  Gets a little bit better with Benadryl but does not go away a significant amount.  No vision changes.  No pain with extraocular motion.  No fever, nausea, vomiting, malaise or other associated symptoms. No other associated or modifying symptoms.    Past Medical History:  Diagnosis Date  . Bipolar affective (HCC)   . Depression   . Gonorrhea   . MRSA (methicillin resistant staph aureus) culture positive   . Scoliosis   . Substance abuse [facility](HCC)     Patient Active Problem List   Diagnosis Date Noted  . 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 MDD (major depressive disorder), recurrent severe, without psychosis (HCC) 01/13/2015  . Suicidal thoughts   . MDD (major depressive disorder), recurrent, severe, with psychosis (HCC) 12/31/2014  . History of bipolar disorder 12/31/2014  . PTSD (post-traumatic stress disorder) 12/31/2014  . Altered mental status 05/23/2014  . Overdose 05/23/2014  . Metabolic encephalopathy 05/23/2014  . Hypotension, unspecified 01/17/2014  . Bradycardia 01/17/2014  . T wave inversion in EKG 01/17/2014  . Drug overdose 01/16/2014  . Gonorrhea     Past Surgical History:  Procedure Laterality Date  . BACK SURGERY  2012   spinal fusion  . BACK SURGERY  2012   MRSA infection- rods removed.  Marland Kitchen. BACK SURGERY  2013   Rods put back in  . WISDOM TOOTH EXTRACTION      Current Outpatient Rx  . Order #: [id number] Class: Print  . Order #: [id number] Class: Historical Med  . Order #: [id number] Class: Normal  . Order #: [id number] Class: Normal  . Order  #: [id number] Class: Normal    Allergies Patient has no known allergies.  Family History  Problem Relation Age of Onset  . Suicidality Father        Committed suicide    Social History Social History   Tobacco Use  . Smoking status: Current Every Day Smoker    Packs/day: 0.50    Types: Cigarettes  . Smokeless tobacco: Never Used  Substance Use Topics  . Alcohol use: Not Currently    Comment: months  . Drug use: Yes    Types: Heroin    Comment: last heroin use march 2019    Review of Systems  All other systems negative except as documented in the HPI. All pertinent positives and negatives as reviewed in the HPI. ____________________________________________   PHYSICAL EXAM:  VITAL SIGNS: ED Triage Vitals  Enc Vitals Group     BP 06/29/18 0402 107/64     Pulse Rate 06/29/18 0402 73     Resp 06/29/18 0402 18     Temp 06/29/18 0402 98.1 F (36.7 C)     Temp Source 06/29/18 0402 Oral     SpO2 06/29/18 0402 100 %     Weight 06/29/18 0418 145 lb (65.8 kg)     Height 06/29/18 0418 5\' 2"  (1.575 m)    Constitutional: Alert and oriented. Well appearing and in no acute distress. Eyes: Injected conjunctive on the left n. PERRL. EOMI. erythema and edema and tenderness around the left eye.  No exophthalmos Head: Atraumatic. Nose: No congestion/rhinnorhea. Mouth/Throat: Mucous membranes are moist.  Oropharynx non-erythematous. Neck: No stridor.  No meningeal signs.   Cardiovascular: Normal rate, regular rhythm. Good peripheral circulation. Grossly normal heart sounds.   Respiratory: Normal respiratory effort.  No retractions. Lungs CTAB. Gastrointestinal: Soft and nontender. No distention.  Musculoskeletal: No lower extremity tenderness nor edema. No gross deformities of extremities. Neurologic:  Normal speech and language. No gross focal neurologic deficits are appreciated.  Skin:  Skin is warm, dry and intact. No rash noted other than described as  above.  ____________________________________________   INITIAL IMPRESSION / ASSESSMENT AND PLAN / ED COURSE  Preseptal cellulitis without evidence of septal cellulitis.   Pertinent labs & imaging results that were available during my care of the patient were reviewed by me and considered in my medical decision making (see chart for details).  ____________________________________________  FINAL CLINICAL IMPRESSION(S) / ED DIAGNOSES  Final diagnoses:  Preseptal cellulitis     MEDICATIONS GIVEN DURING THIS VISIT:  Medications  tetracaine (PONTOCAINE) 0.5 % ophthalmic solution 2 drop (has no administration in time range)  fluorescein ophthalmic strip 1 strip (has no administration in time range)  erythromycin ophthalmic ointment 1 application (has no administration in time range)  cefTRIAXone (ROCEPHIN) injection 1 g (has no administration in time range)     NEW OUTPATIENT MEDICATIONS STARTED DURING THIS VISIT:  New Prescriptions   AMOXICILLIN-CLAVULANATE (AUGMENTIN) 875-125 MG TABLET    Take 1 tablet by mouth 2 (two) times daily. One po bid x 7 days   ERYTHROMYCIN OPHTHALMIC OINTMENT    Place a 1/2 inch ribbon of ointment into the lower eyelid twice daily.    Note:  This note was prepared with assistance of Dragon voice recognition software. Occasional wrong-word or sound-a-like substitutions may have occurred due to the inherent limitations of voice recognition software.   Nicie Milan, Barbara CowerJason, MD 06/29/18 [phone]

## 2018-06-29 NOTE — ED Triage Notes (Signed)
Patient states that her eye has been swollen x 2 days. States that her eye is very "itchy and watery. States that her eyes are matted together when she wakes up.

## 2018-12-12 ENCOUNTER — Emergency Department (HOSPITAL_COMMUNITY): Payer: Self-pay

## 2018-12-12 ENCOUNTER — Emergency Department (HOSPITAL_COMMUNITY)
Admission: EM | Admit: 2018-12-12 | Discharge: 2018-12-13 | Discharge status: Against Medical Advice | Payer: Self-pay | Attending: Emergency Medicine | Admitting: Emergency Medicine

## 2018-12-12 ENCOUNTER — Other Ambulatory Visit: Payer: Self-pay

## 2018-12-12 ENCOUNTER — Encounter (HOSPITAL_COMMUNITY): Payer: Self-pay | Admitting: Emergency Medicine

## 2018-12-12 DIAGNOSIS — F17210 Nicotine dependence, cigarettes, uncomplicated: Secondary | ICD-10-CM | POA: Insufficient documentation

## 2018-12-12 DIAGNOSIS — L03213 Periorbital cellulitis: Secondary | ICD-10-CM | POA: Insufficient documentation

## 2018-12-12 DIAGNOSIS — Z5320 Procedure and treatment not carried out because of patient's decision for unspecified reasons: Secondary | ICD-10-CM | POA: Insufficient documentation

## 2018-12-12 DIAGNOSIS — F319 Bipolar disorder, unspecified: Secondary | ICD-10-CM | POA: Insufficient documentation

## 2018-12-12 LAB — BASIC METABOLIC PANEL
Anion gap: 7 (ref 5–15)
BUN: <5 mg/dL — ABNORMAL LOW (ref 6–20)
CO2: 26 mmol/L (ref 22–32)
Calcium: 9 mg/dL (ref 8.9–10.3)
Chloride: 106 mmol/L (ref 98–111)
Creatinine, Ser: 0.62 mg/dL (ref 0.44–1.00)
GFR calc Af Amer: >60 mL/min (ref >60)
GFR calc non Af Amer: >60 mL/min (ref >60)
Glucose, Bld: 88 mg/dL (ref 70–99)
Potassium: 3.2 mmol/L — ABNORMAL LOW (ref 3.5–5.1)
Sodium: 139 mmol/L (ref 135–145)

## 2018-12-12 LAB — CBC
HCT: 38.7 % (ref 36.0–46.0)
Hemoglobin: 12.6 g/dL (ref 12.0–15.0)
MCH: 30.4 pg (ref 26.0–34.0)
MCHC: 32.6 g/dL (ref 30.0–36.0)
MCV: 93.5 fL (ref 80.0–100.0)
Platelets: 158 10*3/uL (ref 150–400)
RBC: 4.14 MIL/uL (ref 3.87–5.11)
RDW: 12.7 % (ref 11.5–15.5)
WBC: 4.7 10*3/uL (ref 4.0–10.5)
nRBC: 0 % (ref 0.0–0.2)

## 2018-12-12 LAB — I-STAT BETA HCG BLOOD, ED (MC, WL, AP ONLY): I-stat hCG, quantitative: <5 m[IU]/mL (ref <5)

## 2018-12-12 MED ORDER — IOHEXOL 300 MG/ML  SOLN
75.0000 mL | Freq: Once | INTRAMUSCULAR | Status: AC | PRN
  Administered 2018-12-12: 75 mL via INTRAVENOUS

## 2018-12-12 NOTE — ED Triage Notes (Signed)
Pt in POV reporting swelling to face/eyes X few days. Pt states she has been seen here before for same, was dx with preseptal cellulitis and cellulitis of the face.

## 2018-12-12 NOTE — ED Notes (Signed)
Patient transported to CT scan . 

## 2018-12-12 NOTE — ED Provider Notes (Signed)
[facility] EMERGENCY DEPARTMENT Provider Note   CSN: [id number] Arrival date & time: 12/12/18  2151     History   Chief Complaint Chief Complaint  Patient presents with  . Facial Swelling    HPI Karen Kim is a 23 y.o. female.     Patient presents to the emergency department with a chief complaint of facial swelling.  She states the symptoms started 2 days ago.  Started with her right eye, and progressed to her right cheek, and then today she woke up with swelling of her left eye and left cheek.  She states that the swelling on her face is painful.  She reports that it feels warm to the touch.  She does complain of some pain with eye movement, but denies any blurred vision.  She reports that she has been seen here in the past and was diagnosed with preseptal cellulitis and treated with antibiotics with good relief.  Denies any fevers or chills.  Denies any other associated symptoms.  She denies having taken anything for the symptoms.  The history is provided by the patient. No language interpreter was used.    Past Medical History:  Diagnosis Date  . Bipolar affective ([place])   . Depression   . Gonorrhea   . MRSA (methicillin resistant staph aureus) culture positive   . Scoliosis   . Substance abuse [facility])     Patient Active Problem List   Diagnosis Date Noted  . Major depressive disorder, recurrent severe without psychotic features ([place]) 01/13/2015  . MDD (major depressive disorder), recurrent severe, without psychosis ([place]) 01/13/2015  . Suicidal thoughts   . MDD (major depressive disorder), recurrent, severe, with psychosis ([place]) 12/31/2014  . History of bipolar disorder 12/31/2014  . PTSD (post-traumatic stress disorder) 12/31/2014  . Altered mental status 05/23/2014  . Overdose 05/23/2014  . Metabolic encephalopathy 36/62/9476  . Hypotension, unspecified 01/17/2014  . Bradycardia 01/17/2014  . T wave inversion in EKG 01/17/2014  . Drug  overdose 01/16/2014  . Gonorrhea     Past Surgical History:  Procedure Laterality Date  . BACK SURGERY  2012   spinal fusion  . BACK SURGERY  2012   MRSA infection- rods removed.  Marland Kitchen BACK SURGERY  2013   Rods put back in  . WISDOM TOOTH EXTRACTION       OB History    Gravida  1   Para      Term      Preterm      AB  1   Living  0     SAB  1   TAB      Ectopic      Multiple      Live Births               Home Medications    Prior to Admission medications   Medication Sig Start Date End Date Taking? Authorizing Provider  amoxicillin-clavulanate (AUGMENTIN) 875-125 MG tablet Take 1 tablet by mouth 2 (two) times daily. One po bid x 7 days 06/29/18   Mesner, Corene Cornea, MD  buprenorphine-naloxone (SUBOXONE) 8-2 mg SUBL SL tablet Place 2 tablets under the tongue daily.    [provider]  erythromycin ophthalmic ointment Place a 1/2 inch ribbon of ointment into the lower eyelid twice daily. 06/29/18   Mesner, Corene Cornea, MD  metroNIDAZOLE (FLAGYL) 500 MG tablet Take 1 tablet (500 mg total) by mouth 2 (two) times daily. 12/30/17   Darlina Rumpf, CNM  Saccharomyces boulardii (PROBIOTIC) 250 MG CAPS Take 1 tablet by mouth every morning. 12/30/17   Calvert CantorWeinhold, Samantha C, CNM    Family History Family History  Problem Relation Age of Onset  . Suicidality Father        Committed suicide    Social History Social History   Tobacco Use  . Smoking status: Current Every Day Smoker    Packs/day: 0.50    Types: Cigarettes  . Smokeless tobacco: Never Used  Substance Use Topics  . Alcohol use: Not Currently  . Drug use: Yes    Types: Heroin, IV     Allergies   Patient has no known allergies.   Review of Systems Review of Systems  All other systems reviewed and are negative.    Physical Exam Updated Vital Signs BP 106/72 (BP Location: Right Arm)   Pulse 63   Temp 98.6 F (37 C) (Oral)   Resp 18   Ht 5\' 3"  (1.6 m)   Wt 68 kg   SpO2 100%   BMI  26.57 kg/m   Physical Exam Vitals signs and nursing note reviewed.  Constitutional:      General: She is not in acute distress.    Appearance: She is well-developed.  HENT:     Head: Normocephalic and atraumatic.  Eyes:     Conjunctiva/sclera: Conjunctivae normal.     Comments: Preseptal soft tissue swelling with mild erythema  Neck:     Musculoskeletal: Neck supple.  Cardiovascular:     Rate and Rhythm: Normal rate and regular rhythm.     Heart sounds: No murmur.  Pulmonary:     Effort: Pulmonary effort is normal. No respiratory distress.     Breath sounds: Normal breath sounds.  Abdominal:     Palpations: Abdomen is soft.     Tenderness: There is no abdominal tenderness.  Skin:    General: Skin is warm and dry.  Neurological:     Mental Status: She is alert.      ED Treatments / Results  Labs (all labs ordered are listed, but only abnormal results are displayed) Labs Reviewed  BASIC METABOLIC PANEL - Abnormal; Notable for the following components:      Result Value   Potassium 3.2 (*)    BUN <5 (*)    All other components within normal limits  CBC  I-STAT BETA HCG BLOOD, ED ([facility], [facility], [facility] ONLY)    EKG    Radiology No results found.  Procedures Procedures (including critical care time)  Medications Ordered in ED Medications  iohexol (OMNIPAQUE) 300 MG/ML solution 75 mL (75 mLs Intravenous Contrast Given 12/12/18 2350)     Initial Impression / Assessment and Plan / ED Course  I have reviewed the triage vital signs and the nursing notes.  Pertinent labs & imaging results that were available during my care of the patient were reviewed by me and considered in my medical decision making (see chart for details).       Patient was symptoms that are concerning for early preseptal cellulitis.  She does have some pain with eye movement.  Will check CT orbits.  CT is consistent with preseptal/facial cellulitis.  I have advised patient to receive IV antibiotics  in the emergency department, but she refuses.  She wishes to leave now.  Will have patient sign out AGAINST MEDICAL ADVICE.  She understands the risks, and that this infection can spread behind her eyes into her eyes and does pose threat to her  vision.  Have discussed that she could have increased morbidity and/or mortality.  She has capacity to make her own medical decisions.  Will discharge home with antibiotics and ophthalmology follow-up.  Final Clinical Impressions(s) / ED Diagnoses   Final diagnoses:  Preseptal cellulitis    ED Discharge Orders         Ordered    cephALEXin (KEFLEX) 500 MG capsule  2 times daily     12/13/18 0102    clindamycin (CLEOCIN) 150 MG capsule  3 times daily     12/13/18 0102    erythromycin ophthalmic ointment     12/13/18 0102           Roxy HorsemanBrowning, Adaley Kiene, PA-C 12/13/18 0104    Geoffery Lyonselo, [place], MD 12/13/18 [id number]

## 2018-12-13 MED ORDER — CEPHALEXIN 500 MG PO CAPS: 500.0000 mg | ORAL_CAPSULE | Freq: Two times a day (BID) | ORAL | 0 refills | Status: DC

## 2018-12-13 MED ORDER — CLINDAMYCIN PHOSPHATE 900 MG/50ML IV SOLN
900.0000 mg | Freq: Once | INTRAVENOUS | Status: DC
  Filled 2018-12-13: qty 50

## 2018-12-13 MED ORDER — ERYTHROMYCIN 5 MG/GM OP OINT: TOPICAL_OINTMENT | OPHTHALMIC | 0 refills | Status: DC

## 2018-12-13 MED ORDER — SODIUM CHLORIDE 0.9 % IV SOLN
1.0000 g | Freq: Once | INTRAVENOUS | Status: DC
  Filled 2018-12-13: qty 10

## 2018-12-13 MED ORDER — CLINDAMYCIN HCL 150 MG PO CAPS: 300.0000 mg | ORAL_CAPSULE | Freq: Three times a day (TID) | ORAL | 0 refills | Status: DC

## 2018-12-13 NOTE — ED Notes (Signed)
Pt refusing iv abx, PA to bedside, pt will sign out ama

## 2019-09-12 ENCOUNTER — Encounter (HOSPITAL_COMMUNITY): Payer: Self-pay

## 2019-09-12 ENCOUNTER — Emergency Department (HOSPITAL_COMMUNITY)
Admission: EM | Admit: 2019-09-12 | Discharge: 2019-09-12 | Disposition: A | Discharge status: Home / Self Care | Payer: Self-pay | Attending: Emergency Medicine | Admitting: Emergency Medicine

## 2019-09-12 ENCOUNTER — Other Ambulatory Visit: Payer: Self-pay

## 2019-09-12 DIAGNOSIS — Z8614 Personal history of Methicillin resistant Staphylococcus aureus infection: Secondary | ICD-10-CM | POA: Insufficient documentation

## 2019-09-12 DIAGNOSIS — F17210 Nicotine dependence, cigarettes, uncomplicated: Secondary | ICD-10-CM | POA: Insufficient documentation

## 2019-09-12 DIAGNOSIS — N750 Cyst of Bartholin's gland: Secondary | ICD-10-CM | POA: Insufficient documentation

## 2019-09-12 MED ORDER — LIDOCAINE-EPINEPHRINE (PF) 2 %-1:200000 IJ SOLN
10.0000 mL | Freq: Once | INTRAMUSCULAR | Status: AC
  Administered 2019-09-12: 10 mL
  Filled 2019-09-12: qty 20

## 2019-09-12 NOTE — ED Provider Notes (Signed)
[facility] EMERGENCY DEPARTMENT Provider Note   CSN: [id number] Arrival date & time: 09/12/19  0049     History Chief Complaint  Patient presents with  . Abscess    Karen Kim is a 24 y.o. female.  Patient presents to the emergency department with a chief complaint of abscess.  She states that she noticed an abscess on her labia about 3 days ago.  She has a history of the same.  She rates her pain as an 8 out of 10.  She denies history of diabetes, but does have history of MRSA.  Denies any fevers or chills.  Denies any treatments prior to arrival.  The history is provided by the patient. No language interpreter was used.       Past Medical History:  Diagnosis Date  . Bipolar affective (HCC)   . Depression   . Gonorrhea   . MRSA (methicillin resistant staph aureus) culture positive   . Scoliosis   . Substance abuse [facility])     Patient Active Problem List   Diagnosis Date Noted  . 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 MDD (major depressive disorder), recurrent severe, without psychosis (HCC) 01/13/2015  . Suicidal thoughts   . MDD (major depressive disorder), recurrent, severe, with psychosis (HCC) 12/31/2014  . History of bipolar disorder 12/31/2014  . PTSD (post-traumatic stress disorder) 12/31/2014  . Altered mental status 05/23/2014  . Overdose 05/23/2014  . Metabolic encephalopathy 05/23/2014  . Hypotension, unspecified 01/17/2014  . Bradycardia 01/17/2014  . T wave inversion in EKG 01/17/2014  . Drug overdose 01/16/2014  . Gonorrhea     Past Surgical History:  Procedure Laterality Date  . BACK SURGERY  2012   spinal fusion  . BACK SURGERY  2012   MRSA infection- rods removed.  Marland Kitchen BACK SURGERY  2013   Rods put back in  . WISDOM TOOTH EXTRACTION       OB History    Gravida  1   Para      Term      Preterm      AB  1   Living  0     SAB  1   TAB      Ectopic      Multiple      Live Births              Family History  Problem Relation Age of Onset  . Suicidality Father        Committed suicide    Social History   Tobacco Use  . Smoking status: Current Every Day Smoker    Packs/day: 0.50    Types: Cigarettes  . Smokeless tobacco: Never Used  Substance Use Topics  . Alcohol use: Not Currently  . Drug use: Yes    Types: Heroin, IV    Home Medications Prior to Admission medications   Medication Sig Start Date End Date Taking? Authorizing Provider  amoxicillin-clavulanate (AUGMENTIN) 875-125 MG tablet Take 1 tablet by mouth 2 (two) times daily. One po bid x 7 days 06/29/18   Mesner, Barbara Cower, MD  buprenorphine-naloxone (SUBOXONE) 8-2 mg SUBL SL tablet Place 2 tablets under the tongue daily.    [provider]  cephALEXin (KEFLEX) 500 MG capsule Take 1 capsule (500 mg total) by mouth 2 (two) times daily. 12/13/18   Roxy Horseman, PA-C  clindamycin (CLEOCIN) 150 MG capsule Take 2 capsules (300 mg total) by mouth 3 (three) times  daily. May dispense as 150mg  capsules 12/13/18   12/15/18, PA-C  erythromycin ophthalmic ointment Place a 1/2 inch ribbon of ointment into the lower eyelids. 12/13/18   12/15/18, PA-C  metroNIDAZOLE (FLAGYL) 500 MG tablet Take 1 tablet (500 mg total) by mouth 2 (two) times daily. 12/30/17   03/02/18, CNM  Saccharomyces boulardii (PROBIOTIC) 250 MG CAPS Take 1 tablet by mouth every morning. 12/30/17   03/02/18, CNM    Allergies    Patient has no known allergies.  Review of Systems   Review of Systems  Constitutional: Negative for chills and fever.  Genitourinary: Positive for vaginal pain. Negative for difficulty urinating and vaginal discharge.  Skin: Positive for color change.    Physical Exam Updated Vital Signs BP 103/66   Pulse 91   Temp 99.1 F (37.3 C) (Oral)   Resp 18   SpO2 97%   Physical Exam Vitals and nursing note reviewed.  Constitutional:        General: She is not in acute distress.    Appearance: She is well-developed.  HENT:     Head: Normocephalic and atraumatic.  Eyes:     Conjunctiva/sclera: Conjunctivae normal.  Cardiovascular:     Rate and Rhythm: Normal rate.     Heart sounds: No murmur.  Pulmonary:     Effort: Pulmonary effort is normal. No respiratory distress.  Abdominal:     General: There is no distension.  Genitourinary:    Comments: [name], RN chaperone present for exam and procedure Left-sided Bartholin gland cyst Musculoskeletal:     Cervical back: Neck supple.     Comments: Moves all extremities  Skin:    General: Skin is warm and dry.  Neurological:     Mental Status: She is alert and oriented to person, place, and time.  Psychiatric:        Mood and Affect: Mood normal.        Behavior: Behavior normal.     ED Results / Procedures / Treatments   Labs (all labs ordered are listed, but only abnormal results are displayed) Labs Reviewed - No data to display  EKG None  Radiology No results found.  Procedures .Calvert CantorIncision and Drainage  Date/Time: 09/12/2019 3:08 AM Performed by: 09/14/2019, PA-C Authorized by: Roxy Horseman, PA-C   Consent:    Consent obtained:  Verbal   Consent given by:  Patient   Risks discussed:  Bleeding, incomplete drainage and pain   Alternatives discussed:  No treatment Universal protocol:    Procedure explained and questions answered to patient or proxy's satisfaction: yes     Relevant documents present and verified: yes     Test results available and properly labeled: yes     Imaging studies available: yes     Required blood products, implants, devices, and special equipment available: yes     Site/side marked: yes     Immediately prior to procedure a time out was called: yes     Patient identity confirmed:  Verbally with patient Location:    Type:  Bartholin cyst   Size:  ~1x1x1 cm   Location:  Anogenital   Anogenital location:  Bartholin's  gland Pre-procedure details:    Skin preparation:  Betadine Anesthesia (see MAR for exact dosages):    Anesthesia method:  Local infiltration   Local anesthetic:  Lidocaine 1% WITH epi Procedure type:    Complexity:  Complex Procedure details:    Incision type: double stab incision.  Incision depth:  Subcutaneous   Scalpel blade:  11   Wound management:  Probed and deloculated, irrigated with saline and extensive cleaning   Drainage:  Purulent   Drainage amount:  Copious   Wound treatment:  Drain placed   Packing material: loop drain. Post-procedure details:    Patient tolerance of procedure:  Tolerated well, no immediate complications   (including critical care time)  Medications Ordered in ED Medications  lidocaine-EPINEPHrine (XYLOCAINE W/EPI) 2 %-1:200000 (PF) injection 10 mL (has no administration in time range)    ED Course  I have reviewed the triage vital signs and the nursing notes.  Pertinent labs & imaging results that were available during my care of the patient were reviewed by me and considered in my medical decision making (see chart for details).    MDM Rules/Calculators/A&P                      Patient here with Bartholin Gland abscess.  Drained by me without complication.  Loop drain placed.  Recommend f/u with OBGYN or return to ED if symptoms worsen.  Patient advised to untie and remove loop drain in 3 days if it hasn't fallen out. Final Clinical Impression(s) / ED Diagnoses Final diagnoses:  Bartholin gland cyst    Rx / DC Orders ED Discharge Orders    None       Montine Circle, PA-C 09/12/19 1443    Ward, Delice Bison, DO 09/12/19 0345

## 2019-09-12 NOTE — ED Notes (Signed)
Patient verbalizes understanding of discharge instructions. Opportunity for questioning and answers were provided. Armband removed by staff, pt discharged from ED ambulatory.   

## 2019-09-12 NOTE — ED Triage Notes (Signed)
Pt arrives via POV from home w/ c/o abscess on groin x 3 days. Pain 8/10.

## 2019-12-07 ENCOUNTER — Emergency Department (HOSPITAL_COMMUNITY)
Admission: EM | Admit: 2019-12-07 | Discharge: 2019-12-07 | Disposition: A | Discharge status: Home / Self Care | Payer: Medicaid Other | Attending: Emergency Medicine | Admitting: Emergency Medicine

## 2019-12-07 ENCOUNTER — Emergency Department (HOSPITAL_COMMUNITY): Payer: Medicaid Other

## 2019-12-07 ENCOUNTER — Encounter (HOSPITAL_COMMUNITY): Payer: Self-pay

## 2019-12-07 ENCOUNTER — Other Ambulatory Visit: Payer: Self-pay

## 2019-12-07 DIAGNOSIS — R0789 Other chest pain: Secondary | ICD-10-CM | POA: Insufficient documentation

## 2019-12-07 DIAGNOSIS — R42 Dizziness and giddiness: Secondary | ICD-10-CM | POA: Insufficient documentation

## 2019-12-07 DIAGNOSIS — Z5321 Procedure and treatment not carried out due to patient leaving prior to being seen by health care provider: Secondary | ICD-10-CM | POA: Insufficient documentation

## 2019-12-07 DIAGNOSIS — R0602 Shortness of breath: Secondary | ICD-10-CM | POA: Insufficient documentation

## 2019-12-07 LAB — BASIC METABOLIC PANEL
Anion gap: 15 (ref 5–15)
BUN: 10 mg/dL (ref 6–20)
CO2: 18 mmol/L — ABNORMAL LOW (ref 22–32)
Calcium: 9.5 mg/dL (ref 8.9–10.3)
Chloride: 106 mmol/L (ref 98–111)
Creatinine, Ser: 0.8 mg/dL (ref 0.44–1.00)
GFR calc Af Amer: >60 mL/min (ref >60)
GFR calc non Af Amer: >60 mL/min (ref >60)
Glucose, Bld: 105 mg/dL — ABNORMAL HIGH (ref 70–99)
Potassium: 2.8 mmol/L — ABNORMAL LOW (ref 3.5–5.1)
Sodium: 139 mmol/L (ref 135–145)

## 2019-12-07 LAB — CBC
HCT: 40.8 % (ref 36.0–46.0)
Hemoglobin: 13.9 g/dL (ref 12.0–15.0)
MCH: 30.9 pg (ref 26.0–34.0)
MCHC: 34.1 g/dL (ref 30.0–36.0)
MCV: 90.7 fL (ref 80.0–100.0)
Platelets: 255 10*3/uL (ref 150–400)
RBC: 4.5 MIL/uL (ref 3.87–5.11)
RDW: 11.9 % (ref 11.5–15.5)
WBC: 5.6 10*3/uL (ref 4.0–10.5)
nRBC: 0 % (ref 0.0–0.2)

## 2019-12-07 LAB — I-STAT BETA HCG BLOOD, ED (MC, WL, AP ONLY): I-stat hCG, quantitative: <5 m[IU]/mL (ref <5)

## 2019-12-07 LAB — TROPONIN I (HIGH SENSITIVITY): Troponin I (High Sensitivity): <2 ng/L (ref <18)

## 2019-12-07 NOTE — ED Triage Notes (Signed)
Pt arrives POV for eval of SOB, dizziness, CP and anxiety. Pt reports that she uses heroin daily, states she couldn't get heroin this AM, so she snorted meth instead. Pt is very anxious is triage, shaking and difficult to redirect. Pt reports she has only used meth 2-3 times in the past.

## 2019-12-07 NOTE — ED Notes (Signed)
No answer x2 for a room 

## 2022-05-28 ENCOUNTER — Emergency Department (HOSPITAL_COMMUNITY): Payer: Medicaid Other

## 2022-05-28 ENCOUNTER — Other Ambulatory Visit: Payer: Self-pay

## 2022-05-28 ENCOUNTER — Emergency Department (HOSPITAL_COMMUNITY)
Admission: EM | Admit: 2022-05-28 | Discharge: 2022-05-29 | Disposition: A | Discharge status: Home / Self Care | Payer: Self-pay | Attending: Emergency Medicine | Admitting: Emergency Medicine

## 2022-05-28 DIAGNOSIS — F1129 Opioid dependence with unspecified opioid-induced disorder: Secondary | ICD-10-CM

## 2022-05-28 DIAGNOSIS — R569 Unspecified convulsions: Secondary | ICD-10-CM | POA: Insufficient documentation

## 2022-05-28 DIAGNOSIS — F11288 Opioid dependence with other opioid-induced disorder: Secondary | ICD-10-CM | POA: Insufficient documentation

## 2022-05-28 DIAGNOSIS — Q046 Congenital cerebral cysts: Secondary | ICD-10-CM | POA: Insufficient documentation

## 2022-05-28 DIAGNOSIS — M7989 Other specified soft tissue disorders: Secondary | ICD-10-CM | POA: Insufficient documentation

## 2022-05-28 LAB — COMPREHENSIVE METABOLIC PANEL
ALT: 14 U/L (ref 0–44)
AST: 13 U/L — ABNORMAL LOW (ref 15–41)
Albumin: 4.1 g/dL (ref 3.5–5.0)
Alkaline Phosphatase: 41 U/L (ref 38–126)
Anion gap: 7 (ref 5–15)
BUN: 19 mg/dL (ref 6–20)
CO2: 28 mmol/L (ref 22–32)
Calcium: 9.3 mg/dL (ref 8.9–10.3)
Chloride: 106 mmol/L (ref 98–111)
Creatinine, Ser: 0.7 mg/dL (ref 0.44–1.00)
GFR, Estimated: >60 mL/min (ref >60)
Glucose, Bld: 84 mg/dL (ref 70–99)
Potassium: 4.1 mmol/L (ref 3.5–5.1)
Sodium: 141 mmol/L (ref 135–145)
Total Bilirubin: 0.7 mg/dL (ref 0.3–1.2)
Total Protein: 7.1 g/dL (ref 6.5–8.1)

## 2022-05-28 LAB — CBG MONITORING, ED: Glucose-Capillary: 102 mg/dL — ABNORMAL HIGH (ref 70–99)

## 2022-05-28 LAB — CBC
HCT: 42.5 % (ref 36.0–46.0)
Hemoglobin: 13.7 g/dL (ref 12.0–15.0)
MCH: 30.3 pg (ref 26.0–34.0)
MCHC: 32.2 g/dL (ref 30.0–36.0)
MCV: 94 fL (ref 80.0–100.0)
Platelets: 248 10*3/uL (ref 150–400)
RBC: 4.52 MIL/uL (ref 3.87–5.11)
RDW: 13.2 % (ref 11.5–15.5)
WBC: 7.9 10*3/uL (ref 4.0–10.5)
nRBC: 0 % (ref 0.0–0.2)

## 2022-05-28 NOTE — ED Provider Notes (Signed)
[facility] EMERGENCY DEPARTMENT Provider Note   CSN: [id number] Arrival date & time: 05/28/22  1751     History {Add pertinent medical, surgical, social history, OB history to HPI:1} Chief Complaint  Patient presents with   Altered Mental Status   Withdrawal    Karen Kim is a 26 y.o. female.  She is brought to the emergency department by mother.  Mother is concerned because patient is having periods of memory loss.  Patient has a history of substance abuse, specifically fentanyl.  She has been living in [place] state up until recently.  She reports that she had an episode 2 weeks ago where she had seizures.  She went to a local ER and was told she had a brain tumor.  Other concerned about the brain tumor and the fact that she is about to run out of her Suboxone.       Home Medications Prior to Admission medications   Medication Sig Start Date End Date Taking? Authorizing Provider  amoxicillin-clavulanate (AUGMENTIN) 875-125 MG tablet Take 1 tablet by mouth 2 (two) times daily. One po bid x 7 days 06/29/18   Mesner, Barbara Cower, MD  buprenorphine-naloxone (SUBOXONE) 8-2 mg SUBL SL tablet Place 2 tablets under the tongue daily.    [provider]  cephALEXin (KEFLEX) 500 MG capsule Take 1 capsule (500 mg total) by mouth 2 (two) times daily. 12/13/18   Roxy Horseman, PA-C  clindamycin (CLEOCIN) 150 MG capsule Take 2 capsules (300 mg total) by mouth 3 (three) times daily. May dispense as 150mg  capsules 12/13/18   12/15/18, PA-C  erythromycin ophthalmic ointment Place a 1/2 inch ribbon of ointment into the lower eyelids. 12/13/18   12/15/18, PA-C  metroNIDAZOLE (FLAGYL) 500 MG tablet Take 1 tablet (500 mg total) by mouth 2 (two) times daily. 12/30/17   03/02/18, CNM  Saccharomyces boulardii (PROBIOTIC) 250 MG CAPS Take 1 tablet by mouth every morning. 12/30/17   03/02/18, CNM      Allergies    Patient has no known allergies.     Review of Systems   Review of Systems  Physical Exam Updated Vital Signs BP 95/78   Pulse 72   Temp 98.4 F (36.9 C) (Oral)   Resp 18   Ht 5\' 1"  (1.549 m)   Wt 72.1 kg   LMP 11/30/2021   SpO2 100%   BMI 30.04 kg/m  Physical Exam Vitals and nursing note reviewed.  Constitutional:      General: She is not in acute distress.    Appearance: She is well-developed.  HENT:     Head: Normocephalic and atraumatic.     Mouth/Throat:     Mouth: Mucous membranes are moist.  Eyes:     General: Vision grossly intact. Gaze aligned appropriately.     Extraocular Movements: Extraocular movements intact.     Conjunctiva/sclera: Conjunctivae normal.  Cardiovascular:     Rate and Rhythm: Normal rate and regular rhythm.     Pulses: Normal pulses.     Heart sounds: Normal heart sounds, S1 normal and S2 normal. No murmur heard.    No friction rub. No gallop.  Pulmonary:     Effort: Pulmonary effort is normal. No respiratory distress.     Breath sounds: Normal breath sounds.  Abdominal:     General: Bowel sounds are normal.     Palpations: Abdomen is soft.     Tenderness: There is no abdominal tenderness. There is no guarding  or rebound.     Hernia: No hernia is present.  Musculoskeletal:        General: No swelling.     Cervical back: Full passive range of motion without pain, normal range of motion and neck supple. No spinous process tenderness or muscular tenderness. Normal range of motion.     Right lower leg: No edema.     Left lower leg: No edema.  Skin:    General: Skin is warm and dry.     Capillary Refill: Capillary refill takes less than 2 seconds.     Findings: No ecchymosis, erythema, rash or wound.  Neurological:     General: No focal deficit present.     Mental Status: She is alert and oriented to person, place, and time.     GCS: GCS eye subscore is 4. GCS verbal subscore is 5. GCS motor subscore is 6.     Cranial Nerves: Cranial nerves 2-12 are intact.     Sensory:  Sensation is intact.     Motor: Motor function is intact.     Coordination: Coordination is intact.  Psychiatric:        Attention and Perception: Attention normal.        Mood and Affect: Mood normal.        Speech: Speech normal.        Behavior: Behavior normal.     ED Results / Procedures / Treatments   Labs (all labs ordered are listed, but only abnormal results are displayed) Labs Reviewed  COMPREHENSIVE METABOLIC PANEL - Abnormal; Notable for the following components:      Result Value   AST 13 (*)    All other components within normal limits  CBG MONITORING, ED - Abnormal; Notable for the following components:   Glucose-Capillary 102 (*)    All other components within normal limits  CBC    EKG None  Radiology No results found.  Procedures Procedures  {Document cardiac monitor, telemetry assessment procedure when appropriate:1}  Medications Ordered in ED Medications - No data to display  ED Course/ Medical Decision Making/ A&P                           Medical Decision Making Amount and/or Complexity of Data Reviewed Labs: ordered. Radiology: ordered.   ***  {Document critical care time when appropriate:1} {Document review of labs and clinical decision tools ie heart score, Chads2Vasc2 etc:1}  {Document your independent review of radiology images, and any outside records:1} {Document your discussion with family members, caretakers, and with consultants:1} {Document social determinants of health affecting pt's care:1} {Document your decision making why or why not admission, treatments were needed:1} Final Clinical Impression(s) / ED Diagnoses Final diagnoses:  None    Rx / DC Orders ED Discharge Orders     None

## 2022-05-28 NOTE — ED Notes (Signed)
ED Provider at bedside. 

## 2022-05-28 NOTE — ED Triage Notes (Signed)
Pt presents for evaluation of memory loss and substance abuse withdrawal, and seizures, per pt's mom, she was diagnosed with a brain tumor this month, and she also is running out of her Suboxone medication.

## 2022-05-29 LAB — D-DIMER, QUANTITATIVE: D-Dimer, Quant: 0.32 ug/mL-FEU (ref 0.00–0.50)

## 2022-05-29 MED ORDER — BUPRENORPHINE HCL-NALOXONE HCL 8-2 MG SL SUBL: 1.0000 | SUBLINGUAL_TABLET | Freq: Three times a day (TID) | SUBLINGUAL | 0 refills | Status: AC

## 2022-05-29 NOTE — ED Notes (Signed)
Patient transported to CT 

## 2022-05-29 NOTE — ED Notes (Signed)
ED Provider at bedside. 

## 2022-06-12 ENCOUNTER — Telehealth: Payer: Self-pay | Admitting: Emergency Medicine

## 2022-06-12 ENCOUNTER — Encounter: Payer: Self-pay | Admitting: Emergency Medicine

## 2022-06-12 ENCOUNTER — Other Ambulatory Visit: Payer: Self-pay

## 2022-06-12 ENCOUNTER — Ambulatory Visit
Admission: EM | Admit: 2022-06-12 | Discharge: 2022-06-12 | Disposition: A | Discharge status: Home / Self Care | Payer: Medicaid Other | Attending: Family Medicine | Admitting: Family Medicine

## 2022-06-12 DIAGNOSIS — R600 Localized edema: Secondary | ICD-10-CM | POA: Insufficient documentation

## 2022-06-12 DIAGNOSIS — N390 Urinary tract infection, site not specified: Secondary | ICD-10-CM | POA: Diagnosis not present

## 2022-06-12 HISTORY — DX: Cerebral cysts: G93.0

## 2022-06-12 LAB — POCT URINALYSIS DIP (MANUAL ENTRY)
Bilirubin, UA: NEGATIVE
Glucose, UA: NEGATIVE mg/dL
Nitrite, UA: NEGATIVE
Protein Ur, POC: 100 mg/dL — AB
Spec Grav, UA: 1.025 (ref 1.010–1.025)
Urobilinogen, UA: 1 E.U./dL
pH, UA: 7 (ref 5.0–8.0)

## 2022-06-12 LAB — POCT URINE PREGNANCY: Preg Test, Ur: NEGATIVE

## 2022-06-12 MED ORDER — CEPHALEXIN 500 MG PO CAPS: 500.0000 mg | ORAL_CAPSULE | Freq: Two times a day (BID) | ORAL | 0 refills | Status: DC

## 2022-06-12 MED ORDER — NITROFURANTOIN MONOHYD MACRO 100 MG PO CAPS: 100.0000 mg | ORAL_CAPSULE | Freq: Two times a day (BID) | ORAL | 0 refills | Status: DC

## 2022-06-12 NOTE — ED Triage Notes (Signed)
Pt reports recurrent history of UTI's and reports last took antibiotic a few weeks ago for similar. Pt reports symptoms returned shortly after finishing antibiotics.   Pt denies any known fevers, abdominal pain, nausea.

## 2022-06-12 NOTE — ED Provider Notes (Signed)
[facility]    CSN: [id number] Arrival date & time: 06/12/22  1658      History   Chief Complaint Chief Complaint  Patient presents with   Urinary Frequency    HPI Karen Kim is a 26 y.o. female.   Patient presenting today with several week history of dysuria, urinary frequency, suprapubic pressure.  States she was treated for urinary tract infection a month ago, symptoms improved for about a week and then symptoms started to come back.  Denies fever, chills, flank pain, abdominal pain, nausea vomiting or diarrhea.  So far not trying anything over-the-counter for symptoms.  She is also concerned about ongoing left leg swelling, slight discoloration that she states has been ongoing for about 3 years now.  She was seen in the emergency department recently and a D-dimer was performed which was negative.  She states that it improves when she elevates the leg but she has not been diligent about that or compression stockings lately.  Denies chest pain, shortness of breath, palpitations, dizziness.   Past Medical History:  Diagnosis Date   Bipolar affective [facility])    Brain cyst    unknown diagnosis but reports is following up with neurosurgeon.   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/13/2015   MDD (major depressive disorder), recurrent severe, without psychosis (HCC) 01/13/2015   Suicidal thoughts    MDD (major depressive disorder), recurrent, severe, with psychosis (HCC) 12/31/2014   History of bipolar disorder 12/31/2014   PTSD (post-traumatic stress disorder) 12/31/2014   Altered mental status 05/23/2014   Overdose 05/23/2014   Metabolic encephalopathy 05/23/2014   Hypotension, unspecified 01/17/2014   Bradycardia 01/17/2014   T wave  inversion in EKG 01/17/2014   Drug overdose 01/16/2014   Gonorrhea     Past Surgical History:  Procedure Laterality Date   BACK SURGERY  2012   spinal fusion   BACK SURGERY  2012   MRSA infection- rods removed.   BACK SURGERY  2013   Rods put back in   WISDOM TOOTH EXTRACTION      OB History     Gravida  1   Para      Term      Preterm      AB  1   Living  0      SAB  1   IAB      Ectopic      Multiple      Live Births               Home Medications    Prior to Admission medications   Medication Sig Start Date End Date Taking? Authorizing Provider  amoxicillin-clavulanate (AUGMENTIN) 875-125 MG tablet Take 1 tablet by mouth 2 (two) times daily. One po bid x 7 days 06/29/18   Mesner, Barbara Cower, MD  buprenorphine-naloxone (SUBOXONE) 8-2 mg SUBL SL tablet Place 1 tablet under the tongue in the morning, at noon, and at bedtime. 05/29/22   Gilda Crease, MD  cephALEXin (KEFLEX) 500 MG capsule Take 1 capsule (500 mg total) by mouth 2 (two) times daily. 06/12/22   Particia Nearing, PA-C  clindamycin (CLEOCIN) 150 MG capsule Take 2 capsules (300 mg total) by mouth 3 (three) times  daily. May dispense as 150mg  capsules 12/13/18   12/15/18, PA-C  erythromycin ophthalmic ointment Place a 1/2 inch ribbon of ointment into the lower eyelids. 12/13/18   12/15/18, PA-C  metroNIDAZOLE (FLAGYL) 500 MG tablet Take 1 tablet (500 mg total) by mouth 2 (two) times daily. 12/30/17   03/02/18, CNM  Saccharomyces boulardii (PROBIOTIC) 250 MG CAPS Take 1 tablet by mouth every morning. 12/30/17   03/02/18, CNM    Family History Family History  Problem Relation Age of Onset   Suicidality Father        Committed suicide    Social History Social History   Tobacco Use   Smoking status: Every Day    Packs/day: 0.50    Types: Cigarettes   Smokeless tobacco: Never  Substance Use Topics   Alcohol use: Not Currently   Drug use: Not  Currently    Types: Heroin, IV    Comment: former IV drug user     Allergies   Patient has no known allergies.   Review of Systems Review of Systems Per HPI  Physical Exam Triage Vital Signs ED Triage Vitals  Enc Vitals Group     BP 06/12/22 1758 109/75     Pulse Rate 06/12/22 1758 68     Resp 06/12/22 1758 16     Temp 06/12/22 1758 98.2 F (36.8 C)     Temp Source 06/12/22 1758 Oral     SpO2 06/12/22 1758 99 %     Weight --      Height --      Head Circumference --      Peak Flow --      Pain Score 06/12/22 1803 7     Pain Loc --      Pain Edu? --      Excl. in GC? --    No data found.  Updated Vital Signs BP 109/75 (BP Location: Right Arm)   Pulse 68   Temp 98.2 F (36.8 C) (Oral)   Resp 16   LMP 12/30/2021 (Approximate) Comment: irregular periods x6 months.  SpO2 99%   Visual Acuity Right Eye Distance:   Left Eye Distance:   Bilateral Distance:    Right Eye Near:   Left Eye Near:    Bilateral Near:     Physical Exam Vitals and nursing note reviewed.  Constitutional:      Appearance: Normal appearance. She is not ill-appearing.  HENT:     Head: Atraumatic.     Mouth/Throat:     Mouth: Mucous membranes are moist.  Eyes:     Extraocular Movements: Extraocular movements intact.     Conjunctiva/sclera: Conjunctivae normal.  Cardiovascular:     Rate and Rhythm: Normal rate and regular rhythm.     Heart sounds: Normal heart sounds.  Pulmonary:     Effort: Pulmonary effort is normal.     Breath sounds: Normal breath sounds.  Abdominal:     General: Bowel sounds are normal. There is no distension.     Palpations: Abdomen is soft.     Tenderness: There is no abdominal tenderness. There is no right CVA tenderness, left CVA tenderness or guarding.  Genitourinary:    Comments: GU exam deferred, self swab performed Musculoskeletal:        General: Swelling present. No deformity or signs of injury. Normal range of motion.     Cervical back: Normal  range of motion and neck supple.  Comments: 1+ edema to the left leg diffusely, chronic per patient.  Negative squeeze test, negative Homans' sign  Skin:    General: Skin is warm.     Comments: Slight erythema to left leg diffusely  Neurological:     Mental Status: She is alert and oriented to person, place, and time.  Psychiatric:        Mood and Affect: Mood normal.        Thought Content: Thought content normal.        Judgment: Judgment normal.    UC Treatments / Results  Labs (all labs ordered are listed, but only abnormal results are displayed) Labs Reviewed  POCT URINALYSIS DIP (MANUAL ENTRY) - Abnormal; Notable for the following components:      Result Value   Ketones, POC UA trace (5) (*)    Blood, UA trace-intact (*)    Protein Ur, POC =100 (*)    Leukocytes, UA Small (1+) (*)    All other components within normal limits  URINE CULTURE  POCT URINE PREGNANCY  CERVICOVAGINAL ANCILLARY ONLY    EKG   Radiology No results found.  Procedures Procedures (including critical care time)  Medications Ordered in UC Medications - No data to display  Initial Impression / Assessment and Plan / UC Course  I have reviewed the triage vital signs and the nursing notes.  Pertinent labs & imaging results that were available during my care of the patient were reviewed by me and considered in my medical decision making (see chart for details).     Urinalysis with evidence of a possible urinary tract infection, treat with Keflex, await urine culture and adjust if needed.  Regarding the leg swelling, discussed consistent use of compression stockings, leg elevation and drinking plenty of fluids.  Very low suspicion for DVT and negative D-dimer recently in the hospital.  PCP follow-up recommended.  Final Clinical Impressions(s) / UC Diagnoses   Final diagnoses:  Acute lower UTI  Leg edema, left     Discharge Instructions      We are treating you for a urinary infection  today.  We have sent out for a urine culture and a vaginal swab for further evaluation to make sure we have you on the right medicine.  Make sure to drink plenty of fluids.  Regarding your left leg swelling, wear compression stockings throughout the day consistently, elevate the leg at rest, drink plenty of fluids    ED Prescriptions     Medication Sig Dispense Auth. Provider   cephALEXin (KEFLEX) 500 MG capsule  (Status: Discontinued) Take 1 capsule (500 mg total) by mouth 2 (two) times daily. 10 capsule Particia Nearing, PA-C   cephALEXin (KEFLEX) 500 MG capsule Take 1 capsule (500 mg total) by mouth 2 (two) times daily. 10 capsule Particia Nearing, [place]      PDMP not reviewed this encounter.   Particia Nearing, [place] 06/12/22 1906

## 2022-06-12 NOTE — Discharge Instructions (Signed)
We are treating you for a urinary infection today.  We have sent out for a urine culture and a vaginal swab for further evaluation to make sure we have you on the right medicine.  Make sure to drink plenty of fluids.  Regarding your left leg swelling, wear compression stockings throughout the day consistently, elevate the leg at rest, drink plenty of fluids

## 2022-06-12 NOTE — Telephone Encounter (Signed)
Pt mother called and reported pt had a dental procedure recently and is currently on amoxicillin and inquired if new keflex prescription could also be taken. Consulted PA reported would electronically send in prescription for Macrobid. Pt mother aware and reported would pick up Macrobid in the morning and tell pt not to take Keflex.

## 2022-06-13 LAB — URINE CULTURE: Culture: NO GROWTH

## 2022-06-13 LAB — CERVICOVAGINAL ANCILLARY ONLY
Bacterial Vaginitis (gardnerella): POSITIVE — AB
Candida Glabrata: NEGATIVE
Candida Vaginitis: POSITIVE — AB
Chlamydia: NEGATIVE
Comment: NEGATIVE
Comment: NEGATIVE
Comment: NEGATIVE
Comment: NEGATIVE
Comment: NEGATIVE
Comment: NORMAL
Neisseria Gonorrhea: NEGATIVE
Trichomonas: NEGATIVE

## 2022-06-14 ENCOUNTER — Telehealth: Payer: Self-pay | Admitting: Emergency Medicine

## 2022-06-14 ENCOUNTER — Telehealth (HOSPITAL_COMMUNITY): Payer: Self-pay | Admitting: Emergency Medicine

## 2022-06-14 MED ORDER — METRONIDAZOLE 500 MG PO TABS: 500.0000 mg | ORAL_TABLET | Freq: Two times a day (BID) | ORAL | 0 refills | Status: DC

## 2022-06-14 MED ORDER — FLUCONAZOLE 150 MG PO TABS: 150.0000 mg | ORAL_TABLET | Freq: Once | ORAL | 0 refills | Status: AC

## 2022-06-14 MED ORDER — FLUCONAZOLE 150 MG PO TABS: 150.0000 mg | ORAL_TABLET | Freq: Once | ORAL | 0 refills | Status: DC

## 2022-07-18 DIAGNOSIS — S32009K Unspecified fracture of unspecified lumbar vertebra, subsequent encounter for fracture with nonunion: Secondary | ICD-10-CM | POA: Diagnosis not present

## 2022-07-18 DIAGNOSIS — Z6830 Body mass index (BMI) 30.0-30.9, adult: Secondary | ICD-10-CM | POA: Diagnosis not present

## 2022-07-18 DIAGNOSIS — Q046 Congenital cerebral cysts: Secondary | ICD-10-CM | POA: Diagnosis not present

## 2022-08-01 ENCOUNTER — Other Ambulatory Visit (HOSPITAL_COMMUNITY): Payer: Self-pay | Admitting: Neurosurgery

## 2022-08-01 DIAGNOSIS — Q046 Congenital cerebral cysts: Secondary | ICD-10-CM

## 2022-08-01 DIAGNOSIS — S32009K Unspecified fracture of unspecified lumbar vertebra, subsequent encounter for fracture with nonunion: Secondary | ICD-10-CM

## 2022-08-02 ENCOUNTER — Ambulatory Visit: Payer: Medicaid Other | Admitting: Family Medicine

## 2022-08-02 DIAGNOSIS — Z9189 Other specified personal risk factors, not elsewhere classified: Secondary | ICD-10-CM | POA: Insufficient documentation

## 2022-08-14 ENCOUNTER — Ambulatory Visit
Admission: RE | Admit: 2022-08-14 | Discharge: 2022-08-14 | Disposition: A | Discharge status: Home / Self Care | Payer: Medicaid Other | Source: Ambulatory Visit | Attending: Neurosurgery | Admitting: Neurosurgery

## 2022-08-14 DIAGNOSIS — S32009K Unspecified fracture of unspecified lumbar vertebra, subsequent encounter for fracture with nonunion: Secondary | ICD-10-CM

## 2022-08-14 DIAGNOSIS — Q046 Congenital cerebral cysts: Secondary | ICD-10-CM

## 2022-08-22 ENCOUNTER — Ambulatory Visit: Payer: Medicaid Other

## 2022-08-27 ENCOUNTER — Telehealth: Payer: Self-pay

## 2022-08-27 NOTE — Telephone Encounter (Signed)
Mychart msg sent

## 2022-08-29 ENCOUNTER — Ambulatory Visit
Admission: RE | Admit: 2022-08-29 | Discharge: 2022-08-29 | Disposition: A | Discharge status: Home / Self Care | Payer: Medicaid Other | Source: Ambulatory Visit | Attending: Neurosurgery | Admitting: Neurosurgery

## 2022-08-29 DIAGNOSIS — M4325 Fusion of spine, thoracolumbar region: Secondary | ICD-10-CM | POA: Diagnosis not present

## 2022-08-29 DIAGNOSIS — Q046 Congenital cerebral cysts: Secondary | ICD-10-CM | POA: Diagnosis present

## 2022-08-29 DIAGNOSIS — S32009K Unspecified fracture of unspecified lumbar vertebra, subsequent encounter for fracture with nonunion: Secondary | ICD-10-CM | POA: Insufficient documentation

## 2022-08-29 DIAGNOSIS — R519 Headache, unspecified: Secondary | ICD-10-CM | POA: Diagnosis not present

## 2022-08-29 DIAGNOSIS — M4317 Spondylolisthesis, lumbosacral region: Secondary | ICD-10-CM | POA: Diagnosis not present

## 2022-08-29 MED ORDER — GADOBUTROL 1 MMOL/ML IV SOLN
7.0000 mL | Freq: Once | INTRAVENOUS | Status: AC | PRN
  Administered 2022-08-29: 7 mL via INTRAVENOUS

## 2022-09-04 DIAGNOSIS — Q046 Congenital cerebral cysts: Secondary | ICD-10-CM | POA: Diagnosis not present

## 2022-09-04 DIAGNOSIS — M4317 Spondylolisthesis, lumbosacral region: Secondary | ICD-10-CM | POA: Diagnosis not present

## 2022-09-04 DIAGNOSIS — S32009K Unspecified fracture of unspecified lumbar vertebra, subsequent encounter for fracture with nonunion: Secondary | ICD-10-CM | POA: Diagnosis not present

## 2022-09-04 DIAGNOSIS — Z6832 Body mass index (BMI) 32.0-32.9, adult: Secondary | ICD-10-CM | POA: Diagnosis not present

## 2022-09-16 DIAGNOSIS — H5213 Myopia, bilateral: Secondary | ICD-10-CM | POA: Diagnosis not present

## 2022-09-17 ENCOUNTER — Ambulatory Visit
Admission: EM | Admit: 2022-09-17 | Discharge: 2022-09-17 | Disposition: A | Discharge status: Home / Self Care | Payer: Medicaid Other | Attending: Family Medicine | Admitting: Family Medicine

## 2022-09-17 DIAGNOSIS — N898 Other specified noninflammatory disorders of vagina: Secondary | ICD-10-CM | POA: Insufficient documentation

## 2022-09-17 DIAGNOSIS — N390 Urinary tract infection, site not specified: Secondary | ICD-10-CM | POA: Insufficient documentation

## 2022-09-17 LAB — POCT URINALYSIS DIP (MANUAL ENTRY)
Glucose, UA: NEGATIVE mg/dL
Nitrite, UA: POSITIVE — AB
Protein Ur, POC: 100 mg/dL — AB
Spec Grav, UA: >=1.03 — AB (ref 1.010–1.025)
Urobilinogen, UA: 1 E.U./dL
pH, UA: 5.5 (ref 5.0–8.0)

## 2022-09-17 MED ORDER — CEPHALEXIN 500 MG PO CAPS: 500.0000 mg | ORAL_CAPSULE | Freq: Two times a day (BID) | ORAL | 0 refills | Status: DC

## 2022-09-17 NOTE — ED Triage Notes (Signed)
Pt reports her urine has a foul odor and yellow discharge.x 1 month  Mom says after she uses the restroom it smells "rotten". The patient has reoccuring UTI's and when she takes the meds she is better but then it gets back worse.

## 2022-09-17 NOTE — ED Provider Notes (Signed)
[facility]    CSN: [id number] Arrival date & time: 09/17/22  1259      History   Chief Complaint No chief complaint on file.   HPI Karen Kim is a 27 y.o. female.   Patient presenting today with about a month of odor, urinary frequency, dysuria, vaginal discharge that is yellow in color.  She states that her mom reports that it smells rotten after she urinates.  Denies fever, chills, flank pain, nausea, vomiting.  History of recurrent urinary tract infections that always improved with medication but returned shortly after.    Past Medical History:  Diagnosis Date   Bipolar affective [facility])    Brain cyst    unknown diagnosis but reports is following up with neurosurgeon.   Depression    Gonorrhea    MRSA (methicillin resistant staph aureus) culture positive    Scoliosis    Substance abuse [facility])     Patient Active Problem List   Diagnosis Date Noted   History of drug overdose 08/02/2022   MDD (major depressive disorder), recurrent severe, without psychosis ([place]) 01/13/2015   History of bipolar disorder 12/31/2014   PTSD (post-traumatic stress disorder) 12/31/2014   Overdose 05/23/2014    Past Surgical History:  Procedure Laterality Date   BACK SURGERY  2012   spinal fusion   BACK SURGERY  2012   MRSA infection- rods removed.   BACK SURGERY  2013   Rods put back in   [place]      OB History     Gravida  1   Para      Term      Preterm      AB  1   Living  0      SAB  1   IAB      Ectopic      Multiple      Live Births               Home Medications    Prior to Admission medications   Medication Sig Start Date End Date Taking? Authorizing Provider  ARIPiprazole (ABILIFY) 15 MG tablet Take 15 mg by mouth at bedtime. 09/12/22  Yes [provider]  cephALEXin (KEFLEX) 500 MG capsule Take 1 capsule (500 mg total) by mouth 2 (two) times daily. 09/17/22  Yes Volney American, PA-C   naloxone [facility]) nasal spray 4 mg/0.1 mL SMARTSIG:1 Spray(s) Both Nares PRN 05/14/22  Yes [provider]  amoxicillin-clavulanate (AUGMENTIN) 875-125 MG tablet Take 1 tablet by mouth 2 (two) times daily. One po bid x 7 days 06/29/18   Mesner, Corene Cornea, MD  buprenorphine-naloxone (SUBOXONE) 8-2 mg SUBL SL tablet Place 1 tablet under the tongue in the morning, at noon, and at bedtime. 05/29/22   Orpah Greek, MD  clindamycin (CLEOCIN) 150 MG capsule Take 2 capsules (300 mg total) by mouth 3 (three) times daily. May dispense as 150mg  capsules 12/13/18   Montine Circle, PA-C  cloNIDine (CATAPRES - DOSED IN MG/24 HR) 0.1 mg/24hr patch Place onto the skin.    [provider]  erythromycin ophthalmic ointment Place a 1/2 inch ribbon of ointment into the lower eyelids. 12/13/18   Montine Circle, PA-C  hydrOXYzine (ATARAX) 50 MG tablet Take 50 mg by mouth every 6 (six) hours as needed.    [provider]  LAMICTAL 25 MG tablet     [provider]  metroNIDAZOLE (FLAGYL) 500 MG tablet Take 1 tablet (500 mg  total) by mouth 2 (two) times daily. 06/14/22   LampteyMyrene Galas, MD  Saccharomyces boulardii (PROBIOTIC) 250 MG CAPS Take 1 tablet by mouth every morning. 12/30/17   Darlina Rumpf, CNM  sertraline (ZOLOFT) 25 MG tablet     [provider]  traZODone (DESYREL) 50 MG tablet Take 25-50 mg by mouth at bedtime as needed.    [provider]    Family History Family History  Problem Relation Age of Onset   Suicidality Father        Committed suicide    Social History Social History   Tobacco Use   Smoking status: Every Day    Packs/day: .5    Types: Cigarettes   Smokeless tobacco: Never  Substance Use Topics   Alcohol use: Not Currently   Drug use: Not Currently    Types: Heroin, IV    Comment: former IV drug user     Allergies   Patient has no known allergies.   Review of Systems Review of Systems Per  HPI  Physical Exam Triage Vital Signs ED Triage Vitals  Enc Vitals Group     BP 09/17/22 1353 101/68     Pulse Rate 09/17/22 1353 (!) 59     Resp 09/17/22 1353 20     Temp 09/17/22 1353 98.8 F (37.1 C)     Temp Source 09/17/22 1353 Oral     SpO2 09/17/22 1353 96 %     Weight --      Height --      Head Circumference --      Peak Flow --      Pain Score 09/17/22 1357 0     Pain Loc --      Pain Edu? --      Excl. in [place]? --    No data found.  Updated Vital Signs BP 101/68 (BP Location: Right Arm)   Pulse (!) 59   Temp 98.8 F (37.1 C) (Oral)   Resp 20   LMP 08/22/2022 (Exact Date)   SpO2 96%   Visual Acuity Right Eye Distance:   Left Eye Distance:   Bilateral Distance:    Right Eye Near:   Left Eye Near:    Bilateral Near:     Physical Exam Vitals and nursing note reviewed.  Constitutional:      Appearance: Normal appearance. She is not ill-appearing.  HENT:     Head: Atraumatic.  Eyes:     Extraocular Movements: Extraocular movements intact.     Conjunctiva/sclera: Conjunctivae normal.  Cardiovascular:     Rate and Rhythm: Normal rate and regular rhythm.     Heart sounds: Normal heart sounds.  Pulmonary:     Effort: Pulmonary effort is normal.     Breath sounds: Normal breath sounds.  Abdominal:     General: Bowel sounds are normal. There is no distension.     Palpations: Abdomen is soft.     Tenderness: There is no abdominal tenderness. There is no right CVA tenderness, left CVA tenderness or guarding.  Genitourinary:    Comments: GU exam deferred, self swab performed Musculoskeletal:        General: Normal range of motion.     Cervical back: Normal range of motion and neck supple.  Skin:    General: Skin is warm and dry.  Neurological:     Mental Status: She is alert and oriented to person, place, and time.  Psychiatric:  Mood and Affect: Mood normal.        Thought Content: Thought content normal.        Judgment: Judgment normal.       UC Treatments / Results  Labs (all labs ordered are listed, but only abnormal results are displayed) Labs Reviewed  POCT URINALYSIS DIP (MANUAL ENTRY) - Abnormal; Notable for the following components:      Result Value   Bilirubin, UA small (*)    Ketones, POC UA trace (5) (*)    Spec Grav, UA >=1.030 (*)    Blood, UA moderate (*)    Protein Ur, POC =100 (*)    Nitrite, UA Positive (*)    Leukocytes, UA Large (3+) (*)    All other components within normal limits  URINE CULTURE  CERVICOVAGINAL ANCILLARY ONLY    EKG   Radiology No results found.  Procedures Procedures (including critical care time)  Medications Ordered in UC Medications - No data to display  Initial Impression / Assessment and Plan / UC Course  I have reviewed the triage vital signs and the nursing notes.  Pertinent labs & imaging results that were available during my care of the patient were reviewed by me and considered in my medical decision making (see chart for details).     Urinalysis today positive for a lower urinary tract infection, Keflex sent, urine culture pending.  Vaginal swab also pending given to her abnormal vaginal discharge.  Treat based on these results.  Discussed supportive over-the-counter medications and home care, possibly seeking urology consult given recurrent nature of urinary tract infections. Final Clinical Impressions(s) / UC Diagnoses   Final diagnoses:  Acute lower UTI  Vaginal discharge   Discharge Instructions   None    ED Prescriptions     Medication Sig Dispense Auth. Provider   cephALEXin (KEFLEX) 500 MG capsule Take 1 capsule (500 mg total) by mouth 2 (two) times daily. 10 capsule Volney American, [place]      PDMP not reviewed this encounter.   Volney American, [place] 09/17/22 1426

## 2022-09-18 LAB — CERVICOVAGINAL ANCILLARY ONLY
Bacterial Vaginitis (gardnerella): POSITIVE — AB
Candida Glabrata: NEGATIVE
Candida Vaginitis: NEGATIVE
Chlamydia: NEGATIVE
Comment: NEGATIVE
Comment: NEGATIVE
Comment: NEGATIVE
Comment: NEGATIVE
Comment: NEGATIVE
Comment: NORMAL
Neisseria Gonorrhea: NEGATIVE
Trichomonas: NEGATIVE

## 2022-09-19 ENCOUNTER — Telehealth (HOSPITAL_COMMUNITY): Payer: Self-pay | Admitting: Emergency Medicine

## 2022-09-19 LAB — URINE CULTURE: Culture: >=100000 — AB

## 2022-09-19 MED ORDER — METRONIDAZOLE 500 MG PO TABS: 500.0000 mg | ORAL_TABLET | Freq: Two times a day (BID) | ORAL | 0 refills | Status: DC

## 2022-09-26 ENCOUNTER — Ambulatory Visit: Payer: Medicaid Other | Admitting: Family Medicine

## 2022-09-27 ENCOUNTER — Ambulatory Visit: Payer: Medicaid Other | Admitting: Family Medicine

## 2022-10-18 ENCOUNTER — Encounter: Payer: Medicaid Other | Admitting: Obstetrics and Gynecology

## 2022-11-01 ENCOUNTER — Other Ambulatory Visit: Payer: Self-pay

## 2022-11-01 ENCOUNTER — Ambulatory Visit (INDEPENDENT_AMBULATORY_CARE_PROVIDER_SITE_OTHER): Payer: Medicaid Other | Admitting: Obstetrics and Gynecology

## 2022-11-01 ENCOUNTER — Encounter: Payer: Self-pay | Admitting: Obstetrics and Gynecology

## 2022-11-01 VITALS — BP 88/61 | HR 89 | Ht 63.0 in | Wt 169.6 lb

## 2022-11-01 DIAGNOSIS — N390 Urinary tract infection, site not specified: Secondary | ICD-10-CM | POA: Diagnosis not present

## 2022-11-01 DIAGNOSIS — R300 Dysuria: Secondary | ICD-10-CM

## 2022-11-01 DIAGNOSIS — R319 Hematuria, unspecified: Secondary | ICD-10-CM | POA: Diagnosis not present

## 2022-11-01 DIAGNOSIS — Z3009 Encounter for other general counseling and advice on contraception: Secondary | ICD-10-CM | POA: Diagnosis not present

## 2022-11-01 DIAGNOSIS — N920 Excessive and frequent menstruation with regular cycle: Secondary | ICD-10-CM

## 2022-11-01 LAB — POCT URINALYSIS DIP (DEVICE)
Bilirubin Urine: NEGATIVE
Glucose, UA: NEGATIVE mg/dL
Ketones, ur: NEGATIVE mg/dL
Nitrite: POSITIVE — AB
Protein, ur: 100 mg/dL — AB
Specific Gravity, Urine: >=1.03 (ref 1.005–1.030)
Urobilinogen, UA: 1 mg/dL (ref 0.0–1.0)
pH: 7 (ref 5.0–8.0)

## 2022-11-01 LAB — POCT PREGNANCY, URINE: Preg Test, Ur: NEGATIVE

## 2022-11-01 MED ORDER — SULFAMETHOXAZOLE-TRIMETHOPRIM 800-160 MG PO TABS: 1.0000 | ORAL_TABLET | Freq: Two times a day (BID) | ORAL | 0 refills | Status: DC

## 2022-11-01 NOTE — Progress Notes (Signed)
Obstetrics and Gynecology New Patient Evaluation  Appointment Date: 11/01/2022  OBGYN Clinic: Center for [facility]  Primary Care Provider: Patient, No Pcp Per  Referring Provider: No ref. provider found  Chief Complaint: No chief complaint on file.   History of Present Illness: Karen Kim is a 27 y.o. G0 (Patient's last menstrual period was 10/20/2022.), seen for the above chief complaint.   Patient is interested in the Nexplanon. She has never been on anything for period or birth control. +unprotected intercourse in the past two weeks, since LMP   +dysuria s/s.   Review of Systems: Pertinent items noted in HPI and remainder of comprehensive ROS otherwise negative.    Patient Active Problem List   Diagnosis Date Noted   Menorrhagia with regular cycle 11/01/2022   History of drug overdose 08/02/2022   MDD (major depressive disorder), recurrent severe, without psychosis (HCC) 01/13/2015   History of bipolar disorder 12/31/2014   PTSD (post-traumatic stress disorder) 12/31/2014   Overdose 05/23/2014    Past Medical History:  Past Medical History:  Diagnosis Date   Bipolar affective (HCC)    Brain cyst    unknown diagnosis but reports is following up with neurosurgeon.   Depression    Gonorrhea    MRSA (methicillin resistant staph aureus) culture positive    Scoliosis    Substance abuse (HCC)    UTI (urinary tract infection)     Past Surgical History:  Past Surgical History:  Procedure Laterality Date   BACK SURGERY  2012   spinal fusion   BACK SURGERY  2012   MRSA infection- rods removed.   BACK SURGERY  2013   Rods put back in   WISDOM TOOTH EXTRACTION      Past Obstetrical History:  OB History  Gravida Para Term Preterm AB Living  0       0 0  SAB IAB Ectopic Multiple Live Births  0            Past Gynecological History: As per HPI. Periods: qmonth, regular, 3 days, painful, not heavy History of Pap Smear(s):  No.  Social History:  Social History   Socioeconomic History   Marital status: Single    Spouse name: Not on file   Number of children: Not on file   Years of education: 12   Highest education level: Not on file  Occupational History   Occupation: Unemployed  Tobacco Use   Smoking status: Every Day    Packs/day: .5    Types: Cigarettes   Smokeless tobacco: Never  Substance and Sexual Activity   Alcohol use: Yes   Drug use: Not Currently    Types: Heroin, IV    Comment: former IV drug user   Sexual activity: Yes    Birth control/protection: None  Other Topics Concern   Not on file  Social History Narrative   Lives with mother. Single. Graduated high school. Currently unemployed.   Social Determinants of Health   Financial Resource Strain: Not on file  Food Insecurity: Not on file  Transportation Needs: Not on file  Physical Activity: Not on file  Stress: Not on file  Social Connections: Not on file  Intimate Partner Violence: Not on file    Family History:  Family History  Problem Relation Age of Onset   Suicidality Father        Committed suicide    Medications Skylan J. Hazan had no medications administered during this visit. Current Outpatient Medications  Medication Sig Dispense Refill   ARIPiprazole (ABILIFY) 15 MG tablet Take 15 mg by mouth at bedtime.     buprenorphine-naloxone (SUBOXONE) 8-2 mg SUBL SL tablet Place 1 tablet under the tongue in the morning, at noon, and at bedtime. 21 tablet 0   hydrOXYzine (ATARAX) 50 MG tablet Take 50 mg by mouth every 6 (six) hours as needed.     LAMICTAL 25 MG tablet      naloxone (NARCAN) nasal spray 4 mg/0.1 mL SMARTSIG:1 Spray(s) Both Nares PRN     sertraline (ZOLOFT) 25 MG tablet      traZODone (DESYREL) 50 MG tablet Take 25-50 mg by mouth at bedtime as needed.     cloNIDine (CATAPRES - DOSED IN MG/24 HR) 0.1 mg/24hr patch Place onto the skin.     erythromycin ophthalmic ointment Place a 1/2 inch ribbon of  ointment into the lower eyelids. 3.5 g 0   Saccharomyces boulardii (PROBIOTIC) 250 MG CAPS Take 1 tablet by mouth every morning. 30 capsule 3   No current facility-administered medications for this visit.    Allergies Patient has no known allergies.  Physical Exam:  BP (!) 88/61   Pulse 89   Ht 5\' 3"  (1.6 m)   Wt 169 lb 9.6 oz (76.9 kg)   LMP 10/20/2022   BMI 30.04 kg/m  Body mass index is 30.04 kg/m. General appearance: Well nourished, well developed female in no acute distress.  Respiratory:  Normal respiratory effort Abdomen: soft, nttp, nd Neuro/Psych:  Normal mood and affect.  Skin:  Warm and dry.   Laboratory:  Upt neg Udip with +blood, nitrite  Radiology: none  Assessment: patient stable  Plan:  1. Urinary tract infection with hematuria, site unspecified Patient had UTI in ED earlier this year. Recommend copious hydration, voiding right after intercourse. Will try bactrim ds   2. Encounter for other general counseling or advice on contraception Options d/w her and she would like to do nexplanon. Recommend abstinence and RTC in 2-3 for UPT and nexplanon placement  3. GYN Declines pap today. See if patient is amenable to this at next visit. STI swab negative 08/2022  4. Dysuria  Return in about 2 weeks (around 11/15/2022), or 2-3wks, for in person.  Future Appointments  Date Time Provider Department Center  11/08/2022  9:30 AM Park Meo, FNP [facility]  11/19/2022  9:35 AM Lanae Crumbly, Lahoma Crocker, DO [facility]    Cornelia Copa MD Attending Center for [employer] [facility])

## 2022-11-02 ENCOUNTER — Telehealth: Payer: Self-pay

## 2022-11-02 DIAGNOSIS — N390 Urinary tract infection, site not specified: Secondary | ICD-10-CM

## 2022-11-02 MED ORDER — SULFAMETHOXAZOLE-TRIMETHOPRIM 800-160 MG PO TABS: 1.0000 | ORAL_TABLET | Freq: Two times a day (BID) | ORAL | 0 refills | Status: AC

## 2022-11-02 NOTE — Telephone Encounter (Signed)
Attempted to call patient, to verify pharmacy, at number listed in chart--[phone]--sent to unidentified VM, unable to leave message because "voice mailbox is full".   Reordered Rx to original pharmacy that first Rx was sent to.   Maureen Ralphs RN on 11/02/22 at 1018

## 2022-11-08 ENCOUNTER — Ambulatory Visit: Payer: Medicaid Other | Admitting: Family Medicine

## 2022-11-18 NOTE — Progress Notes (Unsigned)
  This encounter was created in error - please disregard.

## 2022-11-19 ENCOUNTER — Encounter: Payer: Medicaid Other | Admitting: Family Medicine

## 2022-11-19 DIAGNOSIS — Z3009 Encounter for other general counseling and advice on contraception: Secondary | ICD-10-CM

## 2022-11-19 DIAGNOSIS — R300 Dysuria: Secondary | ICD-10-CM

## 2022-12-05 ENCOUNTER — Ambulatory Visit: Payer: Medicaid Other | Admitting: Family Medicine

## 2023-01-07 ENCOUNTER — Ambulatory Visit: Payer: Medicaid Other | Admitting: Family Medicine

## 2023-01-10 ENCOUNTER — Ambulatory Visit: Payer: Medicaid Other | Admitting: Obstetrics and Gynecology

## 2023-01-10 DIAGNOSIS — Z124 Encounter for screening for malignant neoplasm of cervix: Secondary | ICD-10-CM

## 2023-01-10 DIAGNOSIS — Z30017 Encounter for initial prescription of implantable subdermal contraceptive: Secondary | ICD-10-CM

## 2023-01-23 DIAGNOSIS — Z130 Encounter for screening for diseases of the blood and blood-forming organs and certain disorders involving the immune mechanism: Secondary | ICD-10-CM | POA: Diagnosis not present

## 2023-01-23 DIAGNOSIS — Z79899 Other long term (current) drug therapy: Secondary | ICD-10-CM | POA: Diagnosis not present

## 2023-01-23 DIAGNOSIS — N760 Acute vaginitis: Secondary | ICD-10-CM | POA: Diagnosis not present

## 2023-01-23 DIAGNOSIS — Z1329 Encounter for screening for other suspected endocrine disorder: Secondary | ICD-10-CM | POA: Diagnosis not present

## 2023-04-30 DIAGNOSIS — Z3490 Encounter for supervision of normal pregnancy, unspecified, unspecified trimester: Secondary | ICD-10-CM | POA: Diagnosis not present

## 2023-04-30 DIAGNOSIS — Z3201 Encounter for pregnancy test, result positive: Secondary | ICD-10-CM | POA: Diagnosis not present

## 2023-05-02 DIAGNOSIS — Z3201 Encounter for pregnancy test, result positive: Secondary | ICD-10-CM | POA: Diagnosis not present

## 2023-05-23 DIAGNOSIS — O0991 Supervision of high risk pregnancy, unspecified, first trimester: Secondary | ICD-10-CM | POA: Diagnosis not present

## 2023-05-23 DIAGNOSIS — R875 Abnormal microbiological findings in specimens from female genital organs: Secondary | ICD-10-CM | POA: Diagnosis not present

## 2023-05-23 DIAGNOSIS — O3680X0 Pregnancy with inconclusive fetal viability, not applicable or unspecified: Secondary | ICD-10-CM | POA: Diagnosis not present

## 2023-05-23 DIAGNOSIS — Z124 Encounter for screening for malignant neoplasm of cervix: Secondary | ICD-10-CM | POA: Diagnosis not present

## 2023-05-23 DIAGNOSIS — Z3401 Encounter for supervision of normal first pregnancy, first trimester: Secondary | ICD-10-CM | POA: Diagnosis not present

## 2023-06-20 DIAGNOSIS — D696 Thrombocytopenia, unspecified: Secondary | ICD-10-CM | POA: Diagnosis not present

## 2023-06-20 DIAGNOSIS — O468X1 Other antepartum hemorrhage, first trimester: Secondary | ICD-10-CM | POA: Diagnosis not present

## 2023-06-20 DIAGNOSIS — O0991 Supervision of high risk pregnancy, unspecified, first trimester: Secondary | ICD-10-CM | POA: Diagnosis not present

## 2023-06-20 DIAGNOSIS — O368390 Maternal care for abnormalities of the fetal heart rate or rhythm, unspecified trimester, not applicable or unspecified: Secondary | ICD-10-CM | POA: Diagnosis not present

## 2023-06-20 DIAGNOSIS — O418X10 Other specified disorders of amniotic fluid and membranes, first trimester, not applicable or unspecified: Secondary | ICD-10-CM | POA: Diagnosis not present

## 2023-06-20 DIAGNOSIS — Z369 Encounter for antenatal screening, unspecified: Secondary | ICD-10-CM | POA: Diagnosis not present

## 2023-06-20 DIAGNOSIS — Z3A11 11 weeks gestation of pregnancy: Secondary | ICD-10-CM | POA: Diagnosis not present

## 2023-07-06 DIAGNOSIS — Z3A13 13 weeks gestation of pregnancy: Secondary | ICD-10-CM | POA: Diagnosis not present

## 2023-07-06 DIAGNOSIS — O26891 Other specified pregnancy related conditions, first trimester: Secondary | ICD-10-CM | POA: Diagnosis not present

## 2023-07-06 DIAGNOSIS — R1013 Epigastric pain: Secondary | ICD-10-CM | POA: Diagnosis not present

## 2023-07-09 DIAGNOSIS — N898 Other specified noninflammatory disorders of vagina: Secondary | ICD-10-CM | POA: Diagnosis not present

## 2023-08-06 DIAGNOSIS — Z3402 Encounter for supervision of normal first pregnancy, second trimester: Secondary | ICD-10-CM | POA: Diagnosis not present

## 2023-08-13 DIAGNOSIS — K083 Retained dental root: Secondary | ICD-10-CM | POA: Diagnosis not present

## 2023-08-13 DIAGNOSIS — K029 Dental caries, unspecified: Secondary | ICD-10-CM | POA: Diagnosis not present

## 2023-08-22 DIAGNOSIS — Z363 Encounter for antenatal screening for malformations: Secondary | ICD-10-CM | POA: Diagnosis not present

## 2023-09-19 DIAGNOSIS — N751 Abscess of Bartholin's gland: Secondary | ICD-10-CM | POA: Diagnosis not present

## 2023-09-19 DIAGNOSIS — D696 Thrombocytopenia, unspecified: Secondary | ICD-10-CM | POA: Diagnosis not present

## 2023-09-19 DIAGNOSIS — Z3A24 24 weeks gestation of pregnancy: Secondary | ICD-10-CM | POA: Diagnosis not present

## 2023-09-19 DIAGNOSIS — O0992 Supervision of high risk pregnancy, unspecified, second trimester: Secondary | ICD-10-CM | POA: Diagnosis not present

## 2023-09-19 DIAGNOSIS — Z362 Encounter for other antenatal screening follow-up: Secondary | ICD-10-CM | POA: Diagnosis not present

## 2023-09-19 DIAGNOSIS — O99119 Other diseases of the blood and blood-forming organs and certain disorders involving the immune mechanism complicating pregnancy, unspecified trimester: Secondary | ICD-10-CM | POA: Diagnosis not present

## 2023-09-30 DIAGNOSIS — K036 Deposits [accretions] on teeth: Secondary | ICD-10-CM | POA: Diagnosis not present

## 2023-10-23 DIAGNOSIS — Z23 Encounter for immunization: Secondary | ICD-10-CM | POA: Diagnosis not present

## 2023-10-23 DIAGNOSIS — O0993 Supervision of high risk pregnancy, unspecified, third trimester: Secondary | ICD-10-CM | POA: Diagnosis not present

## 2023-10-23 DIAGNOSIS — Z3A29 29 weeks gestation of pregnancy: Secondary | ICD-10-CM | POA: Diagnosis not present

## 2023-11-06 DIAGNOSIS — N898 Other specified noninflammatory disorders of vagina: Secondary | ICD-10-CM | POA: Diagnosis not present

## 2023-11-06 DIAGNOSIS — Z3689 Encounter for other specified antenatal screening: Secondary | ICD-10-CM | POA: Diagnosis not present

## 2023-11-18 DIAGNOSIS — K029 Dental caries, unspecified: Secondary | ICD-10-CM | POA: Diagnosis not present

## 2023-11-26 DIAGNOSIS — N898 Other specified noninflammatory disorders of vagina: Secondary | ICD-10-CM | POA: Diagnosis not present

## 2023-12-11 DIAGNOSIS — O0993 Supervision of high risk pregnancy, unspecified, third trimester: Secondary | ICD-10-CM | POA: Diagnosis not present

## 2023-12-11 DIAGNOSIS — Z3689 Encounter for other specified antenatal screening: Secondary | ICD-10-CM | POA: Diagnosis not present

## 2023-12-14 DIAGNOSIS — O368131 Decreased fetal movements, third trimester, fetus 1: Secondary | ICD-10-CM | POA: Diagnosis not present

## 2023-12-14 DIAGNOSIS — Z3689 Encounter for other specified antenatal screening: Secondary | ICD-10-CM | POA: Diagnosis not present

## 2023-12-30 DIAGNOSIS — M419 Scoliosis, unspecified: Secondary | ICD-10-CM | POA: Diagnosis not present

## 2023-12-30 DIAGNOSIS — D62 Acute posthemorrhagic anemia: Secondary | ICD-10-CM | POA: Diagnosis not present

## 2023-12-30 DIAGNOSIS — O9832 Other infections with a predominantly sexual mode of transmission complicating childbirth: Secondary | ICD-10-CM | POA: Diagnosis not present

## 2023-12-30 DIAGNOSIS — O99344 Other mental disorders complicating childbirth: Secondary | ICD-10-CM | POA: Diagnosis not present

## 2023-12-30 DIAGNOSIS — Z87891 Personal history of nicotine dependence: Secondary | ICD-10-CM | POA: Diagnosis not present

## 2023-12-30 DIAGNOSIS — Z3A38 38 weeks gestation of pregnancy: Secondary | ICD-10-CM | POA: Diagnosis not present

## 2023-12-30 DIAGNOSIS — A6000 Herpesviral infection of urogenital system, unspecified: Secondary | ICD-10-CM | POA: Diagnosis not present

## 2023-12-30 DIAGNOSIS — O9903 Anemia complicating the puerperium: Secondary | ICD-10-CM | POA: Diagnosis not present

## 2023-12-30 DIAGNOSIS — O3663X0 Maternal care for excessive fetal growth, third trimester, not applicable or unspecified: Secondary | ICD-10-CM | POA: Diagnosis not present

## 2023-12-30 DIAGNOSIS — O99324 Drug use complicating childbirth: Secondary | ICD-10-CM | POA: Diagnosis not present

## 2023-12-30 DIAGNOSIS — O99892 Other specified diseases and conditions complicating childbirth: Secondary | ICD-10-CM | POA: Diagnosis not present

## 2023-12-30 DIAGNOSIS — F3189 Other bipolar disorder: Secondary | ICD-10-CM | POA: Diagnosis not present

## 2023-12-30 DIAGNOSIS — F418 Other specified anxiety disorders: Secondary | ICD-10-CM | POA: Diagnosis not present

## 2023-12-30 DIAGNOSIS — Z792 Long term (current) use of antibiotics: Secondary | ICD-10-CM | POA: Diagnosis not present

## 2023-12-30 DIAGNOSIS — D696 Thrombocytopenia, unspecified: Secondary | ICD-10-CM | POA: Diagnosis not present

## 2023-12-30 DIAGNOSIS — O9912 Other diseases of the blood and blood-forming organs and certain disorders involving the immune mechanism complicating childbirth: Secondary | ICD-10-CM | POA: Diagnosis not present

## 2023-12-30 DIAGNOSIS — Z3403 Encounter for supervision of normal first pregnancy, third trimester: Secondary | ICD-10-CM | POA: Diagnosis not present

## 2023-12-30 NOTE — H&P (Signed)
 [facility]  History and Physical  Assessment  Active Hospital Problems   Indication for care in labor or delivery (*)    Maternal care for excessive fetal growth in third trimester (*)    Pregnancy complicated by subutex  maintenance, antepartum (*)    Antepartum anemia (*)    Thrombocytopenia affecting pregnancy (*)    High-risk pregnancy in third trimester (*)    Bipolar 1 disorder with moderate mania (*)    Drug addiction in remission (*)    Scoliosis  Exam benign. Vitals stable.  Cervix 1/50/-3 NST reactive Toco: rare Membranes intact GBS negative Epidural PRN Plan for cytotec induction for ripening and transition to pitocin/arom as appropriate. Consider cook balloon as appropriate.  Continue home subutex , sertraline, ferrous sulfate Anticipate vaginal delivery.  History  Karen Kim is a 28 y.o. female G1P0000 at [redacted]w[redacted]d weeks with LMP 12/29/2022 with estimated due date of 01/07/2024, by Ultrasound who presents with induction of labor History of Present Illness Induction recommended at 39 weeks due to US  showing concern for excessive fetal growth.  12/11/23 EFW 3781g/8lbs 5 oz @ 36 weeks c/w with 95%ile. Cephalic Posterior placenta Pregnancy also notable for hx of drug abuse currently in maintenance therapy with subutex  4mg  BID. Stable.  Hx of depression and Bipolar disorder, stable.  On sertraline 50mg  daily Iron def anemia on oral iron Tob use in pregnancy on nicotine  patches as needed Hx of HSV, on valtrex suppresion, no symptoms or lesions reported.    Planned Tubal Ligation:      no OB History  Gravida Para Term Preterm AB Living  1 0 0 0 0 0  SAB IAB Ectopic Multiple Live Births  0 0 0 0 0    # Outcome Date GA Lbr Len/2nd Weight Sex Type Anes PTL Lv  1 Current            Past Medical History:  Diagnosis Date  . Anemia   . Anxiety   . Bipolar disorder, manic (*)   . Depression   . Drug abuse (*) 2023   Fentanyl.   Stopped using in 2023  . Genital herpes   . History of MRSA infection   . Scoliosis   . Thrombocytopenia    Past Surgical History:  Procedure Laterality Date  . Spine surgery     metal rod place for curve of spine [facility] at age 17  . Wisdom tooth extraction      Allergies[1] Prior to Admission medications   Medication Sig Start Date End Date Taking? Authorizing Provider  amoxicillin  (AMOXIL ) 500 mg capsule Take one capsule (500 mg dose) by mouth every 8 (eight) hours. 11/19/23   Historical Provider, MD  buprenorphine  (SUBUTEX ) 8 mg SUBL SMARTSIG:0.5 Tablet(s) Sublingual 4 Times Daily 05/14/23   Historical Provider, MD  clotrimazole-betamethasone (LOTRISONE) cream Apply to affected area 2 times daily 11/26/23 11/25/24  Nona M Smith, CNM  famotidine (PEPCID) 20 mg tablet Take one tablet (20 mg dose) by mouth 2 (two) times daily. 10/31/23   Landis CHRISTELLA Bonus, NP  ferrous sulfate 325 (65 FE) MG tablet Take one tablet (325 mg dose) by mouth every other day. 09/20/23   Landis CHRISTELLA Bonus, NP  naloxone  (NARCAN ) nasal spray (TAKE HOME PACK)  05/14/22   Historical Provider, MD  nicotine  (NICODERM CQ ) 7 mg/24 hours Place one patch onto the skin daily. 07/18/23   Katelyn E Overby, FNP  Prenatal Vit-Fe Phos-FA-Omega (VITAFOL GUMMIES) 3.33-0.333-34.8 MG CHEW  tablet Chew three tablets by mouth daily. Chew 3 gummies daily 05/23/23 05/22/24  Waddell Sprague, PA-C  sertraline (ZOLOFT) 50 mg tablet Take one tablet (50 mg dose) by mouth daily.    Historical Provider, MD  valACYclovir (VALTREX) 500 mg tablet Take one tablet (500 mg dose) by mouth 2 (two) times daily. 11/26/23   Keane CHRISTELLA Sharps, CNM   Family History  Problem Relation Age of Onset  . Colon cancer Maternal Grandmother   . Cancer Maternal Grandmother        breast  . Heart disease Maternal Grandfather   . Cancer Maternal Grandfather   . Alcohol abuse Father   . Ovarian cancer Mother   . Hypertension Mother        history  . Depression  Mother   . Cancer Mother        ovarian  . Breast cancer Neg Hx   . Diabetes Neg Hx    Social History   Socioeconomic History  . Marital status: Single  Tobacco Use  . Smoking status: Former    Current packs/day: 0.50    Average packs/day: 0.5 packs/day for 20.5 years (10.2 ttl pk-yrs)    Types: Cigarettes    Start date: 2015    Passive exposure: Current  . Smokeless tobacco: Never  Vaping Use  . Vaping status: Never Used  Substance and Sexual Activity  . Alcohol use: Not Currently    Comment: social  . Drug use: Not Currently    Types: Fentanyl    Comment: stopped 2023  . Sexual activity: Yes    Partners: Male    Birth control/protection: None   Review of Systems  Constitutional:  Negative for activity change, appetite change, chills, fatigue and fever.  HENT:  Negative for congestion, rhinorrhea, sinus pressure, sinus pain and sore throat.   Respiratory:  Negative for cough, chest tightness and shortness of breath.   Cardiovascular:  Negative for chest pain and palpitations.  Gastrointestinal:  Negative for abdominal distention, abdominal pain, constipation, diarrhea, nausea and vomiting.  Genitourinary:  Negative for difficulty urinating, dysuria, pelvic pain, vaginal bleeding and vaginal discharge.  Musculoskeletal:  Negative for arthralgias.  Neurological:  Negative for dizziness, syncope and headaches.  Psychiatric/Behavioral:  Negative for agitation. The patient is not nervous/anxious.         Physical Examination         Latest cervical exam by                  Physical Exam Vitals and nursing note reviewed. Exam conducted with a chaperone present.  Constitutional:      General: She is not in acute distress.    Appearance: Normal appearance.  HENT:     Head: Normocephalic.  Eyes:     Conjunctiva/sclera: Conjunctivae normal.  Cardiovascular:     Rate and Rhythm: Normal rate and regular rhythm.     Pulses: Normal pulses.  Musculoskeletal:      Right lower leg: No edema.     Left lower leg: No edema.  Pulmonary:     Effort: Pulmonary effort is normal.     Breath sounds: Normal breath sounds.  Abdominal:     General: Bowel sounds are normal. There is distension (gravid abdomen).     Palpations: Abdomen is soft.     Tenderness: There is no abdominal tenderness. There is no guarding or rebound.  Genitourinary:    General: Normal vulva.     Vagina: No vaginal discharge.  Skin:  General: Skin is warm.  Neurological:     General: No focal deficit present.     Mental Status: She is alert and oriented to person, place, and time.     Cranial Nerves: No cranial nerve deficit.     Deep Tendon Reflexes: Reflexes normal.  Psychiatric:        Mood and Affect: Mood normal.        Behavior: Behavior normal.        Thought Content: Thought content normal.        Judgment: Judgment normal.     Fetal monitoring:         Uterine activity:      Results  Prenatal Labs: ABO Grouping (no units)  Date Value  05/23/2023 AB   Rh Factor (no units)  Date Value  05/23/2023 Positive   Antibody Screen (no units)  Date Value  05/23/2023 Negative        Hemoglobin (g/dL)  Date Value  93/88/7974 11.5  10/23/2023 11.4  09/19/2023 10.3 (L)   Hematocrit (%)  Date Value  12/11/2023 34.6  10/23/2023 33.7 (L)  09/19/2023 30.5 (L)   Protein (mg/dL)  Date Value  93/82/7974 Negative        Strep Gp B NAA (no units)  Date Value  12/11/2023 Negative          Neisseria Gonorrhoeae, NAA (no units)  Date Value  11/06/2023 Negative  05/23/2023 Negative   GONOCOCCUS BY NAA (no units)  Date Value  12/11/2023 Negative   CHLAMYDIA BY NAA (no units)  Date Value  12/11/2023 Negative   Chlamydia Trachomatis, NAA (no units)  Date Value  11/06/2023 Negative  05/23/2023 Negative   RPR (no units)  Date Value  12/11/2023 Non Reactive  10/23/2023 Non Reactive  05/23/2023 Non Reactive        Rubella Ab, IgG (index)  Date  Value  05/23/2023 1.72     Hepatitis B Surface Ag (no units)  Date Value  05/23/2023 Negative   HIV Ab/p24 Ag Screen (no units)  Date Value  12/11/2023 Non Reactive    No results found for: PPD TSH (uIU/mL)  Date Value  01/23/2023 2.120   No results found for: HMPAP      Cystic Fibrosis Interp. (no units)  Date Value  05/23/2023 Comment   No results found for: CANAVAN No components found for: BETATHALASSEMIAHB  No results found for: SICKLE  No results found for: TAYSACHS     (Electronically Signed:)Darian CHRISTELLA Abrahams, MD 12/30/2023 7:59 PM      [1] No Known Allergies

## 2023-12-31 NOTE — Progress Notes (Signed)
 [facility]  Progress Note  Assessment/Plan  Assessment & Plan   Pt has poor pain control with extremely limited options following unsuccessfully regional anesthetic attempts and  advance cervical dilation so nitrous oxide will be made available for patient and subutex   held   FHT 1  Will  continue to  monitor labor  Interval History  Subjective Karen Kim reports severe pain   Medications:  . [Held by Provider] buprenorphine   4 mg Sublingual BID  . ferrous sulfate  325 mg Oral Every Other Day  . LR  1,000 mL IntraVENous ONCE  . LR  500 mL IntraVENous ONCE  . nicotine   1 patch Transdermal Q24H  . sertraline  50 mg Oral Daily    Physical Examination  Temp (24hrs), Avg:98.4 F (36.9 C), Min:98 F (36.7 C), Max:98.6 F (37 C)       No intake or output data in the 24 hours ending 12/31/23 0700  Latest cervical exam by OB Examiner: AT (12/31/23 1611) Dilation: 8 Effacement (%): 75 Station: -1 Cervix Consistency: Soft Cx Position: Posterior Presentation: Vertex    Objective  Fetal monitoring:   Fetal Heart Rate Mode: External US  (12/31/23 1900 : Acute Interface, Incoming Flowsheet Results) Baseline Rate: 135 bpm (12/31/23 1900 : Acute Interface, Incoming Flowsheet Results) Baseline Classification: No Baseline Change (12/31/23 1900 : Acute Interface, Incoming Flowsheet Results) Variability: Moderate 6-25bpm (12/31/23 1900 : Acute Interface, Incoming Flowsheet Results) Accelerations: Absent (12/31/23 1730 : Acute Interface, Incoming Flowsheet Results) Decelerations: Variable (12/31/23 1900 : Acute Interface, Incoming Flowsheet Results) OB Fetal Movement: Patient Reports Positive Fetal Movement (12/31/23 0900 : Acute Interface, Incoming Flowsheet Results) FHR Comments: pt using nitrous oxide for pain management (12/31/23 1935 : Acute Interface, Incoming Flowsheet Results)     Uterine activity: Mode: External;Palpation (12/31/23 1900 : Acute  Interface, Incoming Flowsheet Results) Contraction Frequency (min): 2-5 (12/31/23 1900 : Acute Interface, Incoming Flowsheet Results) Contraction Duration (sec): 70-140 (12/31/23 1900 : Acute Interface, Incoming Flowsheet Results) Contraction Quality: Moderate (12/31/23 1900 : Acute Interface, Incoming Flowsheet Results)   Results  Labs:  Recent Results (from the past 24 hours)  CBC   Collection Time: 12/30/23  8:37 PM  Result Value Ref Range   WBC 7.5 4.0 - 10.5 thou/mcL   RBC 3.18 (L) 3.93 - 5.22 million/mcL   HGB 10.3 (L) 11.2 - 15.7 gm/dL   HCT 69.1 (L) 65.8 - 55.0 %   MCV 96.9 (H) 79.4 - 94.8 fL   MCH 32.4 (H) 25.6 - 32.2 pg   MCHC 33.4 32.2 - 35.5 gm/dL   Plt Ct 815 849 - 599 thou/mcL   RDW SD 50.4 (H) 35.1 - 46.3 fL   MPV 9.9 9.4 - 12.4 fL   NRBC% 0.0 0.0 - 0.2 /100WBC   Absolute NRBC Count 0.00 0.00 - 0.01 thou/mcL  RPR   Collection Time: 12/30/23  8:37 PM  Result Value Ref Range   RPR QUALITATIVE Non-Reactive Non-Reactive  Type and Screen   Collection Time: 12/30/23  8:37 PM  Result Value Ref Range   ABO Rh Type AB POS    Antibody Screen NEG    Specimen Expiration 01/02/2024 23:59   Previous Blood Bank History?   Collection Time: 12/30/23  8:37 PM  Result Value Ref Range   Prev Pt History? NO   Urine Drugs of Abuse Screen   Collection Time: 12/30/23  9:07 PM  Result Value Ref Range   Ur PH DOA Scr 6.0 4.5 - 9.0  Amphet Scr Negative Negative   Barb Scr Negative Negative   Benzo Scr Negative Negative   Cannab Scr Negative Negative   Cocaine Scr Negative Negative   Opiates Scr Negative Negative   Meth Scr Negative Negative   Oxyco Scr Negative Negative   Fentanyl Scr Negative Negative   Buprenorphine  Screen Positive (A) Negative  ABO and RH Confirmation   Collection Time: 12/30/23 10:01 PM  Result Value Ref Range   ABO Rh Type AB POS     Electronically signed: Geni LOISE Pedro, MD 12/31/2023 / 7:45 PM

## 2023-12-31 NOTE — Progress Notes (Signed)
 [facility]  Progress Note   Assessment/Plan  Active Hospital Problems   *Indication for care in labor or delivery (*)    Maternal care for excessive fetal growth in third trimester (*)    Pregnancy complicated by subutex  maintenance, antepartum (*)    Antepartum anemia (*)    Thrombocytopenia affecting pregnancy (*)    High-risk pregnancy in third trimester (*)    Bipolar 1 disorder with moderate mania (*)    Drug addiction in remission (*)    Scoliosis  Fht cat 1  S/p  cook balloon placement,  Will start pitocin     Interval History  History of Present Illness Pt  feeling contractions  discussed no cervical change.  Discussed  options of  further management. She has  agreed to  cook catheter balloon placement at this   Infusions:  . fentaNYL-bupivacaine    . LR 125 mL/hr (12/30/23 2237)  . naloxone  (NARCAN ) 1 mg in NaCl 0.9 % 250 mL infusion    . oxytocin     Medications:  . buprenorphine   4 mg Sublingual BID  . ferrous sulfate  325 mg Oral Every Other Day  . LR  1,000 mL IntraVENous ONCE  . LR  500 mL IntraVENous ONCE  . LR  500 mL IntraVENous ONCE  . misoprostol  25 mcg Sublingual Q4H  . nicotine   1 patch Transdermal Q24H  . sertraline  50 mg Oral Daily    Physical Examination  Temp:  [98.4 F (36.9 C)-98.6 F (37 C)] 98.4 F (36.9 C) Heart Rate:  [59-94] 67 Resp:  [18-20] 20 BP: (103-125)/(55-84) 106/61 SpO2:  [93 %-98 %] 94 % Pain Score: 0-No pain        No intake or output data in the 24 hours ending 12/31/23 0700  Latest cervical exam by OB Examiner: CBoggess, RN (12/31/23 0529) Dilation: 2.5 Effacement (%): 50 Station: -3 Cervix Consistency: Soft Cx Position: Posterior Presentation: Vertex    Physical Exam  Fetal monitoring:   Fetal Heart Rate Mode: External US  (12/31/23 0900 : Acute Interface, Incoming Flowsheet Results) Baseline Rate: 140 bpm (12/31/23 0900 : Acute Interface, Incoming Flowsheet  Results) Baseline Classification: No Baseline Change (12/31/23 0900 : Acute Interface, Incoming Flowsheet Results) Variability: Minimal - > Undetectable to <5bpm (12/31/23 0900 : Acute Interface, Incoming Flowsheet Results) Accelerations: Present (12/31/23 0900 : Acute Interface, Incoming Flowsheet Results) Decelerations: None (12/31/23 0900 : Acute Interface, Incoming Flowsheet Results) OB Fetal Movement: Patient Reports Positive Fetal Movement (12/31/23 0900 : Acute Interface, Incoming Flowsheet Results) FHR Comments: EFM tracing reviewed by Alycia MD (12/30/23 2025 : Acute Interface, Incoming Flowsheet Results)     Uterine activity: Mode: External (12/31/23 0900 : Acute Interface, Incoming Flowsheet Results) Contraction Frequency (min): 1.5 - 4.5 (12/31/23 0900 : Acute Interface, Incoming Flowsheet Results) Contraction Duration (sec): 50 - 95 (12/31/23 0900 : Acute Interface, Incoming Flowsheet Results)   Results  Labs:  Recent Results (from the past 24 hours)  CBC   Collection Time: 12/30/23  8:37 PM  Result Value Ref Range   WBC 7.5 4.0 - 10.5 thou/mcL   RBC 3.18 (L) 3.93 - 5.22 million/mcL   HGB 10.3 (L) 11.2 - 15.7 gm/dL   HCT 69.1 (L) 65.8 - 55.0 %   MCV 96.9 (H) 79.4 - 94.8 fL   MCH 32.4 (H) 25.6 - 32.2 pg   MCHC 33.4 32.2 - 35.5 gm/dL   Plt Ct 815 849 - 599 thou/mcL   RDW  SD 50.4 (H) 35.1 - 46.3 fL   MPV 9.9 9.4 - 12.4 fL   NRBC% 0.0 0.0 - 0.2 /100WBC   Absolute NRBC Count 0.00 0.00 - 0.01 thou/mcL  Type and Screen   Collection Time: 12/30/23  8:37 PM  Result Value Ref Range   ABO Rh Type AB POS    Antibody Screen NEG    Specimen Expiration 01/02/2024 23:59   Previous Blood Bank History?   Collection Time: 12/30/23  8:37 PM  Result Value Ref Range   Prev Pt History? NO   Urine Drugs of Abuse Screen   Collection Time: 12/30/23  9:07 PM  Result Value Ref Range   Ur PH DOA Scr 6.0 4.5 - 9.0   Amphet Scr Negative Negative   Barb Scr Negative Negative   Benzo Scr  Negative Negative   Cannab Scr Negative Negative   Cocaine Scr Negative Negative   Opiates Scr Negative Negative   Meth Scr Negative Negative   Oxyco Scr Negative Negative   Fentanyl Scr Negative Negative   Buprenorphine  Screen Positive (A) Negative  ABO and RH Confirmation   Collection Time: 12/30/23 10:01 PM  Result Value Ref Range   ABO Rh Type AB POS    Procedure Note: Cook Cevical Ripening Balloon Placement Benefits and risk of balloon placement were discussed with patient.  Patient was made aware that the balloon placement does include however not limited to placental abruption uterine rupture spontaneous rupture membranes as well as onset of labor, explosion of device which could result in fragmentation of the device in utero or intravaginal, or the need for emergency cesarean section. She voiced understanding all questions were answered to her expressed satisfaction Verbal consent was obtained Timeout performed with all necessary personnel in the room.  Hand hygiene performed Pericare performed Patient prepped and draped in sterile fashion Speculum was placed into the vagina  The vagina was cleared of debris Betadine was used to swab the cervix /vagina residual Betadine was removed with cotton swab Sterile gloves donned The Cook balloon catheter was advanced into the cervical os, manually until both balloons were in the cervical canal, the stylet was removed from the device. The uterine balloon was then filled with 40 ml sterile water.  The  device was  retracted until the uterine balloon was at the internal cervical os.  The vaginal balloon was then filled with 20 mL of sterile water.  Each balloon was subsequently filled ultimately the uterine balloon contained the 80 mL of sterile water and the vaginal balloon containing 80 mL of sterile water.  Patient tolerated well   Electronically signed: Geni LOISE Pedro, MD 12/31/2023 / 9:40 AM

## 2024-01-01 NOTE — Lactation Note (Signed)
 Baby at 8 HOL. Born at [redacted]w[redacted]d gestation via VD. LGA. P1. Pt plans to BF for as long as she can. Pt reported baby has been having a hard time latching at the breast, and after several attempts, he is latching with a NS. Pt shared that at the last feeding, baby took the Rt breast with no NS and latched well. Baby was at the breast, already fed and sleeping. I did not observed a feeding. Pt denied any discomfort with the latch. No concerns were reported at this time, and all questions were answered.  Discussed milk supply/demand, latch tips, StS, feeding pattern on day 1 of life, feeding cues, hand expression, and spoon feeding.  Feeding plan for today: - Continue skin to skin to facilitate latching, bonding, and milk supply. - Nursing baby on demand, following hunger cues, at least 8+12 times in 24 hrs.  - When nursing, massage breast before nursing and breast compression during feeding to facilitate milk removal. - Continue to use the nipple shield if that helps baby latch and stay latched, then gradually wean from it. - Hand express and spoon feed if baby is too sleepy. - Watch for adequate wet and dirty diaper for day of life. - Reach out to lactation/nurse for assistance/support as needed.   Average reported intakes of colostrum by healthy, term breastfed infants  First 24 hours following birth: 2-10 mL         24-48 hours of age: 63-15 mL (15 mL=  ounce) 48-72 hours of age: 90-30 mL (30 mL=1 ounce) 72-96 hours of age: 47-60 mL  (60 mL= 2 ounces)   VIDEO RESOURCES:   BREASTFEEDING Deep Latching Techniques: https://bfmedneo.com/Breastfeeding-and-Lactation-Education-Resources/Videos/The-Basics-of-Latching-On CoSpecials.tn https://vimeo.(608) 273-5360   Newborn Swallowing: RelaxingBalm.es   Breast compressions: https://young-ferguson.com/   Breastfeeding  positions: CobrandedAffiliateProgram.com.cy?v=XZfGzJBBwME&t=1s   Breast Massage and Hand Expression: https://vimeo.rnf/34803992   Nipple Shield use and how to wean from it: ShineMist.cz NameHandles.gl   Spoon Feeding (for colostrum) https://www.juarez-rogers.info/   Outpatient Lactation Support: Join us  for [employer] on Mondays at the [facility], [employer], First Floor, [address] [place]. Drop in from 10:30am-12pm. FREE Baby Weight Checks! Call [phone] for breastfeeding support or to schedule a one-on-one consult, in person or virtually, with one of the lactation consultants.

## 2024-01-01 NOTE — Nursing Note (Signed)
 RN at bedside assisting MOB with latching infant from 0415-0500.Educated pt on football for breastfeeding, hand expression, the use of nipple shield, latch techniques, and infant positioning. Infant sustained a deep latch & intermittent sucking, with nipple shield. MOB demonstrated proper football hold and was able to latch infant without assistance. MOB accepted education, no questions at this time.

## 2024-01-01 NOTE — Progress Notes (Signed)
 [facility] O[facility]YN PROGRESS NOTE  A:   PPD1 Vaginal Delivery Subutex  maintenance Bipolar 1 Thrombocytopenia Acute blood loss anemia  P:   Routine care.  Discharge planned tomorrow.  S:   Patient is doing well.  Pain is well controlled.  Bleeding is normal.    O:    VITALS -  BP (!) 113/57 (BP Location: Right Upper Arm, Patient Position: Lying)   Pulse 84   Temp 99.1 F (37.3 C) (Oral)   Resp 18   Ht 5' 2 (1.575 m)   Wt 218 lb 9.6 oz (99.2 kg)   LMP 12/29/2022 (Approximate)   SpO2 94%   Breastfeeding Unknown   BMI 39.98 kg/m  GENERAL  - Well-developed, well-nourished, no acute distress, alert and oriented x 3 ABDOMEN -  Soft, nondistended.  Uterus firm and involuting properly, no fundal tenderness EXTREMITIES -  Lower extremity edema symmetric and appropriate

## 2024-01-06 ENCOUNTER — Other Ambulatory Visit: Payer: Self-pay

## 2024-01-06 ENCOUNTER — Encounter (HOSPITAL_BASED_OUTPATIENT_CLINIC_OR_DEPARTMENT_OTHER): Payer: Self-pay

## 2024-01-06 ENCOUNTER — Emergency Department (HOSPITAL_BASED_OUTPATIENT_CLINIC_OR_DEPARTMENT_OTHER)
Admission: EM | Admit: 2024-01-06 | Discharge: 2024-01-07 | Disposition: A | Discharge status: Home / Self Care | Attending: Emergency Medicine | Admitting: Emergency Medicine

## 2024-01-06 DIAGNOSIS — R600 Localized edema: Secondary | ICD-10-CM | POA: Diagnosis not present

## 2024-01-06 DIAGNOSIS — M7989 Other specified soft tissue disorders: Secondary | ICD-10-CM | POA: Diagnosis not present

## 2024-01-06 NOTE — ED Triage Notes (Addendum)
 Pt to ED for worsening bilateral lower leg swelling. Pt 6 days postpartum. Complicated vaginal birth with hemorrhaging. No blood transfusions were given. Pt states swelling started while in the hospital after birth but has gotten increasingly worse, especially on LLE with redness and warmth. Denies SOB/CP/dizziness, abnormal vaginal bleeding.

## 2024-01-07 ENCOUNTER — Emergency Department (HOSPITAL_BASED_OUTPATIENT_CLINIC_OR_DEPARTMENT_OTHER)

## 2024-01-07 LAB — CBC WITH DIFFERENTIAL/PLATELET
Abs Immature Granulocytes: 0.1 K/uL — ABNORMAL HIGH (ref 0.00–0.07)
Basophils Absolute: 0 K/uL (ref 0.0–0.1)
Basophils Relative: 0 %
Eosinophils Absolute: 0.3 K/uL (ref 0.0–0.5)
Eosinophils Relative: 4 %
HCT: 28.1 % — ABNORMAL LOW (ref 36.0–46.0)
Hemoglobin: 9.4 g/dL — ABNORMAL LOW (ref 12.0–15.0)
Immature Granulocytes: 2 %
Lymphocytes Relative: 21 %
Lymphs Abs: 1.4 K/uL (ref 0.7–4.0)
MCH: 32.4 pg (ref 26.0–34.0)
MCHC: 33.5 g/dL (ref 30.0–36.0)
MCV: 96.9 fL (ref 80.0–100.0)
Monocytes Absolute: 0.5 K/uL (ref 0.1–1.0)
Monocytes Relative: 8 %
Neutro Abs: 4.5 K/uL (ref 1.7–7.7)
Neutrophils Relative %: 65 %
Platelets: 257 K/uL (ref 150–400)
RBC: 2.9 MIL/uL — ABNORMAL LOW (ref 3.87–5.11)
RDW: 13.6 % (ref 11.5–15.5)
WBC: 6.8 K/uL (ref 4.0–10.5)
nRBC: 0 % (ref 0.0–0.2)

## 2024-01-07 LAB — URINALYSIS, ROUTINE W REFLEX MICROSCOPIC
Bilirubin Urine: NEGATIVE
Glucose, UA: NEGATIVE mg/dL
Ketones, ur: NEGATIVE mg/dL
Nitrite: NEGATIVE
Protein, ur: NEGATIVE mg/dL
Specific Gravity, Urine: 1.015 (ref 1.005–1.030)
pH: 7 (ref 5.0–8.0)

## 2024-01-07 LAB — URINALYSIS, MICROSCOPIC (REFLEX)

## 2024-01-07 LAB — COMPREHENSIVE METABOLIC PANEL WITH GFR
ALT: 16 U/L (ref 0–44)
AST: 23 U/L (ref 15–41)
Albumin: 3.4 g/dL — ABNORMAL LOW (ref 3.5–5.0)
Alkaline Phosphatase: 86 U/L (ref 38–126)
Anion gap: 10 (ref 5–15)
BUN: 11 mg/dL (ref 6–20)
CO2: 25 mmol/L (ref 22–32)
Calcium: 8.8 mg/dL — ABNORMAL LOW (ref 8.9–10.3)
Chloride: 105 mmol/L (ref 98–111)
Creatinine, Ser: 0.4 mg/dL — ABNORMAL LOW (ref 0.44–1.00)
GFR, Estimated: >60 mL/min (ref >60)
Glucose, Bld: 75 mg/dL (ref 70–99)
Potassium: 3.6 mmol/L (ref 3.5–5.1)
Sodium: 139 mmol/L (ref 135–145)
Total Bilirubin: 0.3 mg/dL (ref 0.0–1.2)
Total Protein: 5.8 g/dL — ABNORMAL LOW (ref 6.5–8.1)

## 2024-01-07 MED ORDER — FUROSEMIDE 20 MG PO TABS: 20.0000 mg | ORAL_TABLET | Freq: Every day | ORAL | 0 refills | Status: AC

## 2024-01-07 MED ORDER — FUROSEMIDE 40 MG PO TABS
40.0000 mg | ORAL_TABLET | Freq: Once | ORAL | Status: AC
  Administered 2024-01-07: 40 mg via ORAL
  Filled 2024-01-07: qty 1

## 2024-01-07 MED ORDER — POTASSIUM CHLORIDE CRYS ER 20 MEQ PO TBCR
20.0000 meq | EXTENDED_RELEASE_TABLET | Freq: Once | ORAL | Status: AC
  Administered 2024-01-07: 20 meq via ORAL
  Filled 2024-01-07: qty 1

## 2024-01-07 NOTE — ED Notes (Signed)
 Pt reports that she was sitting on her bed on her cell phone and the next thing I knew, he was in the floor. When asked if she fell asleep, she said that she wasn't sleepy at all but she must have.

## 2024-01-07 NOTE — ED Provider Notes (Signed)
 [facility] Provider Note   CSN: [id number] Arrival date & time: 01/06/24  2257     Patient presents with: Leg Swelling and Weakness   Karen Kim is a 28 y.o. female.   The history is provided by the patient.  Illness Location:  B lower extremities Quality:  Edema Severity:  Moderate Onset quality:  Gradual Duration:  7 days Timing:  Constant Progression:  Worsening Chronicity:  New Context:  7 days post partum Relieved by:  Nothing Worsened by:  Nothing Ineffective treatments:  None Associated symptoms: no chest pain, no congestion, no cough, no fever, no shortness of breath, no vomiting and no wheezing   Patient with a h/o Bipolar disorder and substance reports bilateral lower extremity edema was present at discharge post delivery 7 days ago.  No CP, no SOB.      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   Depression    Gonorrhea    MRSA (methicillin resistant staph aureus) culture positive    Scoliosis    Substance abuse (HCC)    UTI (urinary tract infection)      Prior to Admission medications   Medication Sig Start Date End Date Taking? Authorizing Provider  furosemide  (LASIX ) 20 MG tablet Take 1 tablet (20 mg total) by mouth daily. 01/07/24  Yes Vilas Edgerly, MD  ARIPiprazole  (ABILIFY ) 15 MG tablet Take 15 mg by mouth at bedtime. 09/12/22   [provider]  buprenorphine -naloxone  (SUBOXONE ) 8-2 mg SUBL SL tablet Place 1 tablet under the tongue in the morning, at noon, and at bedtime. 05/29/22   Haze Lonni JINNY, MD  hydrOXYzine  (ATARAX ) 50 MG tablet Take 50 mg by mouth every 6 (six) hours as needed.    [provider]  LAMICTAL  25 MG tablet     [provider]  naloxone  (NARCAN ) nasal spray 4 mg/0.1 mL SMARTSIG:1 Spray(s) Both Nares PRN 05/14/22   [provider]   sertraline (ZOLOFT) 25 MG tablet     [provider]  traZODone  (DESYREL ) 50 MG tablet Take 25-50 mg by mouth at bedtime as needed.    [provider]    Allergies: Patient has no known allergies.    Review of Systems  Constitutional:  Negative for fever.  HENT:  Negative for congestion.   Respiratory:  Negative for cough, shortness of breath and wheezing.   Cardiovascular:  Positive for leg swelling. Negative for chest pain and palpitations.  Gastrointestinal:  Negative for vomiting.  All other systems reviewed and are negative.   Updated Vital Signs BP 109/69   Pulse 66   Temp 100 F (37.8 C)   Resp 16   Ht 5' 2 (1.575 m)   Wt 90.7 kg   SpO2 98%   Breastfeeding Yes   BMI 36.58 kg/m   Physical Exam Vitals and nursing note reviewed.  Constitutional:      General: She is not in acute distress.    Appearance: Normal appearance. She is well-developed.  HENT:     Head: Normocephalic and atraumatic.     Nose: Nose normal.  Eyes:     Pupils: Pupils are equal, round, and reactive to light.  Cardiovascular:     Rate and Rhythm: Normal rate and regular rhythm.     Pulses: Normal pulses.     Heart sounds: Normal heart  sounds.  Pulmonary:     Effort: Pulmonary effort is normal. No respiratory distress.     Breath sounds: Normal breath sounds. No wheezing or rales.  Abdominal:     General: Bowel sounds are normal. There is no distension.     Palpations: Abdomen is soft.     Tenderness: There is no abdominal tenderness. There is no guarding or rebound.  Musculoskeletal:        General: Normal range of motion.     Cervical back: Normal range of motion and neck supple.     Right lower leg: Edema present.     Left lower leg: Edema present.     Comments: To the thighs B. No cords, negative Homan's sign   Skin:    General: Skin is warm and dry.     Capillary Refill: Capillary refill takes less than 2 seconds.     Findings: No erythema or rash.   Neurological:     General: No focal deficit present.     Mental Status: She is alert and oriented to person, place, and time.     Deep Tendon Reflexes: Reflexes normal.  Psychiatric:        Mood and Affect: Mood normal.     (all labs ordered are listed, but only abnormal results are displayed) Labs Reviewed  CBC WITH DIFFERENTIAL/PLATELET - Abnormal; Notable for the following components:      Result Value   RBC 2.90 (*)    Hemoglobin 9.4 (*)    HCT 28.1 (*)    Abs Immature Granulocytes 0.10 (*)    All other components within normal limits  COMPREHENSIVE METABOLIC PANEL WITH GFR - Abnormal; Notable for the following components:   Creatinine, Ser 0.40 (*)    Calcium 8.8 (*)    Total Protein 5.8 (*)    Albumin 3.4 (*)    All other components within normal limits  URINALYSIS, ROUTINE W REFLEX MICROSCOPIC - Abnormal; Notable for the following components:   Hgb urine dipstick LARGE (*)    Leukocytes,Ua SMALL (*)    All other components within normal limits  URINALYSIS, MICROSCOPIC (REFLEX) - Abnormal; Notable for the following components:   Bacteria, UA FEW (*)    Non Squamous Epithelial PRESENT (*)    All other components within normal limits    EKG: None  Radiology: DG Chest 2 View Result Date: 01/07/2024 CLINICAL DATA:  Six days postpartum with swelling in the legs and feet and shortness of breath. EXAM: CHEST - 2 VIEW COMPARISON:  PA Lat chest 12/07/2019 FINDINGS: The lungs are clear. The cardiomediastinal silhouette and vascular pattern are normal. No pleural effusion is seen. There is mild thoracic dextroscoliosis and a multilevel thoracolumbar dorsal spinal fusion construct. Compare: Unchanged. IMPRESSION: No evidence of acute chest disease. Mild thoracic dextroscoliosis and a multilevel thoracolumbar dorsal spinal fusion construct. Electronically Signed   By: Francis Quam M.D.   On: 01/07/2024 01:36     Procedures   Medications Ordered in the ED  furosemide  (LASIX )  tablet 40 mg (has no administration in time range)  potassium chloride  SA (KLOR-CON  M) CR tablet 20 mEq (has no administration in time range)                                    Medical Decision Making Patient with edema since birth   Amount and/or Complexity of Data Reviewed Independent Historian: parent  Details: See above  External Data Reviewed: notes.    Details: Previous notes reviewed  Labs: ordered. Radiology: ordered and independent interpretation performed.    Details: No pulmonary edema on CXR Discussion of management or test interpretation with external provider(s): 214 am: case d/w Dr. Isa of OB at [facility].  He reports he was the discharging physician and edema was not noted at that time.  Lasix  for a few days to bring down edema and close follow up.  No breastfeeding.    Risk Prescription drug management. Risk Details: Normal O2 saturation.  No CP no SOB.  No cords.  No signs of preeclampsia or HELLP syndrome based on labs vitals and imaging.  Will start lasix  and have patient follow up closely with OB.  Patient and mother informed that patient will need to be bottle fed during this time.  They verbalize understanding.  Stable for discharge.  Strict returns     Final diagnoses:  Peripheral edema   No signs of systemic illness or infection. The patient is nontoxic-appearing on exam and vital signs are within normal limits.  I have reviewed the triage vital signs and the nursing notes. Pertinent labs & imaging results that were available during my care of the patient were reviewed by me and considered in my medical decision making (see chart for details). After history, exam, and medical workup I feel the patient has been appropriately medically screened and is safe for discharge home. Pertinent diagnoses were discussed with the patient. Patient was given return precautions.     ED Discharge Orders          Ordered    furosemide  (LASIX ) 20 MG tablet  Daily         01/07/24 0239               Aneudy Champlain, MD 01/07/24 0300

## 2024-01-07 NOTE — ED Notes (Signed)
 Mother asking about infant and length of time radiology is taking with infant, reassured mother.

## 2024-02-25 DIAGNOSIS — Z1384 Encounter for screening for dental disorders: Secondary | ICD-10-CM | POA: Diagnosis not present

## 2024-02-26 DIAGNOSIS — N898 Other specified noninflammatory disorders of vagina: Secondary | ICD-10-CM | POA: Diagnosis not present

## 2024-08-04 ENCOUNTER — Encounter: Payer: Self-pay | Admitting: Obstetrics and Gynecology
